# Patient Record
Sex: Female | Born: 1935 | Race: White | Hispanic: No | Smoking: Former smoker
Health system: Southern US, Community
[De-identification: ages and names within clinical notes are randomized; demographics above are authoritative.]

## PROBLEM LIST (undated history)

## (undated) DIAGNOSIS — E559 Vitamin D deficiency, unspecified: Secondary | ICD-10-CM

## (undated) DIAGNOSIS — Z87891 Personal history of nicotine dependence: Secondary | ICD-10-CM

## (undated) DIAGNOSIS — E213 Hyperparathyroidism, unspecified: Secondary | ICD-10-CM

## (undated) DIAGNOSIS — F0390 Unspecified dementia without behavioral disturbance: Secondary | ICD-10-CM

## (undated) DIAGNOSIS — D649 Anemia, unspecified: Secondary | ICD-10-CM

## (undated) DIAGNOSIS — I10 Essential (primary) hypertension: Secondary | ICD-10-CM

## (undated) DIAGNOSIS — D72829 Elevated white blood cell count, unspecified: Secondary | ICD-10-CM

## (undated) DIAGNOSIS — D510 Vitamin B12 deficiency anemia due to intrinsic factor deficiency: Secondary | ICD-10-CM

## (undated) DIAGNOSIS — S72141A Displaced intertrochanteric fracture of right femur, initial encounter for closed fracture: Secondary | ICD-10-CM

## (undated) DIAGNOSIS — M810 Age-related osteoporosis without current pathological fracture: Secondary | ICD-10-CM

## (undated) DIAGNOSIS — F419 Anxiety disorder, unspecified: Secondary | ICD-10-CM

## (undated) DIAGNOSIS — I2510 Atherosclerotic heart disease of native coronary artery without angina pectoris: Secondary | ICD-10-CM

## (undated) DIAGNOSIS — E785 Hyperlipidemia, unspecified: Secondary | ICD-10-CM

## (undated) HISTORY — DX: Displaced intertrochanteric fracture of right femur, initial encounter for closed fracture: S72.141A

## (undated) HISTORY — PX: TONSILLECTOMY: SUR1361

## (undated) HISTORY — DX: Unspecified dementia without behavioral disturbance: F03.90

---

## 1998-05-24 ENCOUNTER — Encounter (HOSPITAL_COMMUNITY): Admission: RE | Admit: 1998-05-24 | Discharge: 1998-08-22 | Payer: Self-pay | Admitting: Family Medicine

## 1998-09-25 ENCOUNTER — Encounter (HOSPITAL_COMMUNITY): Admission: RE | Admit: 1998-09-25 | Discharge: 1998-12-24 | Payer: Self-pay | Admitting: Family Medicine

## 1999-01-16 ENCOUNTER — Encounter (HOSPITAL_COMMUNITY): Admission: RE | Admit: 1999-01-16 | Discharge: 1999-04-16 | Payer: Self-pay | Admitting: Family Medicine

## 2000-02-25 ENCOUNTER — Other Ambulatory Visit: Admission: RE | Admit: 2000-02-25 | Discharge: 2000-02-25 | Payer: Self-pay | Admitting: Family Medicine

## 2005-12-25 ENCOUNTER — Inpatient Hospital Stay (HOSPITAL_COMMUNITY): Admission: EM | Admit: 2005-12-25 | Discharge: 2005-12-26 | Payer: Self-pay | Admitting: Emergency Medicine

## 2007-12-29 ENCOUNTER — Emergency Department (HOSPITAL_COMMUNITY): Admission: EM | Admit: 2007-12-29 | Discharge: 2007-12-30 | Payer: Self-pay | Admitting: Emergency Medicine

## 2009-12-11 ENCOUNTER — Observation Stay (HOSPITAL_COMMUNITY): Admission: EM | Admit: 2009-12-11 | Discharge: 2009-12-14 | Payer: Self-pay | Admitting: Emergency Medicine

## 2010-10-18 ENCOUNTER — Encounter: Admission: RE | Admit: 2010-10-18 | Discharge: 2010-10-18 | Payer: Self-pay | Admitting: Family Medicine

## 2010-11-11 ENCOUNTER — Encounter
Admission: RE | Admit: 2010-11-11 | Discharge: 2010-11-11 | Payer: Self-pay | Source: Home / Self Care | Attending: Family Medicine | Admitting: Family Medicine

## 2010-12-14 ENCOUNTER — Encounter: Payer: Self-pay | Admitting: Family Medicine

## 2010-12-15 ENCOUNTER — Encounter: Payer: Self-pay | Admitting: Family Medicine

## 2011-02-09 LAB — BASIC METABOLIC PANEL
BUN: 6 mg/dL (ref 6–23)
BUN: 8 mg/dL (ref 6–23)
CO2: 25 mEq/L (ref 19–32)
Chloride: 105 mEq/L (ref 96–112)
Chloride: 107 mEq/L (ref 96–112)
Creatinine, Ser: 0.59 mg/dL (ref 0.4–1.2)
Glucose, Bld: 96 mg/dL (ref 70–99)
Potassium: 3.8 mEq/L (ref 3.5–5.1)
Sodium: 138 mEq/L (ref 135–145)

## 2011-02-09 LAB — DIFFERENTIAL
Basophils Relative: 1 % (ref 0–1)
Eosinophils Absolute: 0.1 10*3/uL (ref 0.0–0.7)
Monocytes Absolute: 0.4 10*3/uL (ref 0.1–1.0)
Monocytes Relative: 8 % (ref 3–12)

## 2011-02-09 LAB — PROTIME-INR
INR: 0.99 (ref 0.00–1.49)
Prothrombin Time: 13 seconds (ref 11.6–15.2)

## 2011-02-09 LAB — CBC
HCT: 34.7 % — ABNORMAL LOW (ref 36.0–46.0)
Hemoglobin: 11.6 g/dL — ABNORMAL LOW (ref 12.0–15.0)
Hemoglobin: 11.8 g/dL — ABNORMAL LOW (ref 12.0–15.0)
MCHC: 33.6 g/dL (ref 30.0–36.0)
MCV: 81.4 fL (ref 78.0–100.0)
MCV: 81.7 fL (ref 78.0–100.0)
RBC: 4.22 MIL/uL (ref 3.87–5.11)
RBC: 4.26 MIL/uL (ref 3.87–5.11)
WBC: 5.2 10*3/uL (ref 4.0–10.5)

## 2011-02-09 LAB — CARDIAC PANEL(CRET KIN+CKTOT+MB+TROPI): Troponin I: 0.01 ng/mL (ref 0.00–0.06)

## 2011-02-09 LAB — LIPID PANEL
HDL: 50 mg/dL (ref >39)
Total CHOL/HDL Ratio: 3.3 RATIO
Triglycerides: 61 mg/dL (ref <150)

## 2011-02-09 LAB — POCT CARDIAC MARKERS: Troponin i, poc: <0.05 ng/mL (ref 0.00–0.09)

## 2011-02-09 LAB — CK TOTAL AND CKMB (NOT AT ARMC)
CK, MB: 1.7 ng/mL (ref 0.3–4.0)
Relative Index: INVALID (ref 0.0–2.5)
Total CK: 74 U/L (ref 7–177)

## 2011-02-09 LAB — GLUCOSE, CAPILLARY
Glucose-Capillary: 104 mg/dL — ABNORMAL HIGH (ref 70–99)
Glucose-Capillary: 109 mg/dL — ABNORMAL HIGH (ref 70–99)

## 2011-02-09 LAB — POCT I-STAT, CHEM 8
Calcium, Ion: 1.34 mmol/L — ABNORMAL HIGH (ref 1.12–1.32)
Chloride: 107 mEq/L (ref 96–112)
Glucose, Bld: 107 mg/dL — ABNORMAL HIGH (ref 70–99)
HCT: 35 % — ABNORMAL LOW (ref 36.0–46.0)
Hemoglobin: 11.9 g/dL — ABNORMAL LOW (ref 12.0–15.0)

## 2011-02-09 LAB — TSH: TSH: 0.673 u[IU]/mL (ref 0.350–4.500)

## 2011-04-11 NOTE — Cardiovascular Report (Signed)
Pamela Blackburn, EVON                ACCOUNT NO.:  [id number]   MEDICAL RECORD NO.:  [id number]          PATIENT TYPE:  INP   LOCATION:  3713                         FACILITY:  [facility]   PHYSICIAN:  Mohan N. Sharyn Lull, M.D. DATE OF BIRTH:  1936/04/20   DATE OF PROCEDURE:  12/26/2005  DATE OF DISCHARGE:                              CARDIAC CATHETERIZATION   PROCEDURE:  Left cardiac catheterization with selective left and right  coronary angiograph the left ventriculography via right groin using Judkins  technique.   INDICATIONS FOR PROCEDURE:  Pamela Blackburn is a 75 year old white female with  past medical history significant for pernicious anemia, remote history of  tobacco abuse. She came to the ER via EMS complaining of retrosternal chest  pain described as soreness, tightness associated with feeling weak and tired  last night while in grocery store and again had similar pain while going up  stairs lasting 10-15 minutes. Denies any nausea, vomiting, diaphoresis.  Denies palpitation, lightheadedness or syncope. Denies PND, orthopnea, leg  swelling. States she has been having occasional chest pain off and on  lasting few seconds to minutes with exertion. The patient was noted to have  minimally elevated troponin I and EKG showed normal sinus rhythm and  nonspecific ST-T wave changes. The patient was started on IV heparin and  nitrates with relief of chest pain. Due to typical anginal chest pain,  minimally elevated troponin I, discussed with the patient regarding left  cath, possible angioplasty and stenting its risks and benefits, i.e. death,  MI, stroke, need for emergency CABG, risk of restenosis, local vascular  complications, etc., and consented for the procedure.   PROCEDURE:  After obtaining informed consent, the patient was brought to the  cath lab and was placed on fluoroscopy table. The right groin was prepped  and draped in usual fashion. 2% Xylocaine was used for local anesthesia  in  the right groin. A __________ thin-walled needle 6-French arterial sheath  was placed. Sheath was aspirated and flushed. Next, 6-French left Judkins  catheter was advanced over the wire under fluoroscopic guidance up to the  ascending aorta. Wire was pulled out. The catheter was aspirated and  connected to the manifold. Catheter was further advanced and engaged into  the left coronary ostium. Multiple views of the left system were taken.  Next, the catheter was disengaged and was pulled out over the wire and was  replaced with a 6-French right Judkins catheter which was advanced over the  wire under fluoroscopic guidance up to the ascending aorta. The wire was  pulled out. The catheter was aspirated and connected to the manifold.  Catheter was further advanced and engaged into right coronary ostium.  Multiple views of the right system were taken. Next, the catheter was  disengaged and was pulled out over the wire and was replaced with 6-French  pigtail catheter which was advanced over the wire under fluoroscopic  guidance up to the ascending aorta. Wire was pulled out. The catheter was  aspirated and connected to the manifold. Catheter was further advanced  across aortic valve into the LV. LV  pressures were recorded. Next LV gram  was done in 30 degrees RAO position. Post angiographic pressures were  recorded from LV and then pullback pressures were recorded from the aorta.  There was no gradient across aortic valve. Next the pigtail catheter was  pulled out over the wire. Sheaths were aspirated and flushed.   FINDINGS:  LV showed mild anterolateral and mid inferior wall hypokinesia,  EF of 45%. Left main was short which was patent. LAD has 10-15% mid stenosis  and distally which tapers down at the apex. Diagonal-1 was  moderate size first patent. Diagonal-2 was very small. Left circumflex was  small which was patent. High OM-1 was very small which was patent. RCA has  15-20% mid  stenosis. The patient tolerated procedure well. There were no  complications. The patient was transferred to recovery room in stable  condition.           ______________________________  Eduardo Osier Sharyn Lull, M.D.     MNH/MEDQ  D:  12/26/2005  T:  12/27/2005  Job:  [id number]

## 2011-04-11 NOTE — Discharge Summary (Signed)
[name], Blackburn                ACCOUNT NO.:  [id number]   MEDICAL RECORD NO.:  [id number]          PATIENT TYPE:  INP   LOCATION:  3713                         FACILITY:  [facility]   PHYSICIAN:  Mohan N. Sharyn Lull, M.D. DATE OF BIRTH:  Jan 17, 1936   DATE OF ADMISSION:  12/25/2005  DATE OF DISCHARGE:  12/26/2005                                 DISCHARGE SUMMARY   ADMITTING DIAGNOSES:  1.  Acute coronary syndrome.  2.  History of pernicious anemia.  3.  Remote history of tobacco abuse.  4.  Elevated blood sugar, rule out diabetes mellitus.   FINAL DIAGNOSES:  1.  Status post probable small non-T wave myocardial infarction secondary to      vasospasm.  2.  Hypercholesterolemia.  3.  Remote history of tobacco abuse.  4.  Pernicious anemia.  5.  Elevated blood sugar, rule out diabetes mellitus.   DISCHARGE HOME MEDICATIONS:  1.  Enteric-coated aspirin 81 mg one tablet daily.  2.  Plavix 75 mg one tablet daily with food.  3.  Nitro-Dur 0.2 mg per hour apply to chest wall in the a.m., off at night.  4.  Lopressor 25 mg 1/2 tablet twice daily.  5.  Altace 1.25 mg one capsule daily.  6.  Nitrostat 0.4 mg sublingual use as directed.   DIET:  Low-salt and low-cholesterol.  The patient has been advised to avoid  sweets.   ACTIVITY:  Increase activity slowly.  No driving, lifting, pushing or  pulling for 48 hours.  Post cardiac cath instructions have been given.  Follow up with me in one week.   CONDITION AT DISCHARGE:  Stable.   BRIEF HISTORY AND HOSPITAL COURSE:  [name] is a 75 year old white female  with a past medical history significant for pernicious anemia and remote  history of tobacco abuse.  She came to the ER via EMS complaining of  retrosternal chest pain described as soreness and tightness associated with  feeling weak and tired last night while in grocery store and again had  similar pain while going up stairs lasting 5 to 10 minutes.  Denies any  nausea, vomiting or  diaphoresis.  Denies palpitations and light-headedness  or syncope.  Denies PND, orthopnea or leg swelling.  States she has been  having occasional chest pain off and on lasting a few seconds to minutes  with exertion.  The patient was noted in the ER to have a minimally elevated  troponin-I and EKG showed normal sinus rhythm with nonspecific T-wave  changes.  The patient was started on IV heparin and nitrates with relief of  chest pain.   PAST MEDICAL HISTORY:  As above.   PAST SURGICAL HISTORY:  She had a tonsillectomy in the past.   ALLERGIES:  SHE IS ALLERGIC TO DEMEROL.   MEDICATIONS AT HOME:  She is on vitamin B12 injections.   SOCIAL HISTORY:  She is separated, has five children, smoked a half pack to  one pack per day for 10+ years, quit 29 years ago.  No history of alcohol  abuse.  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   FAMILY HISTORY:  Father died of lung cancer and mother died of breast cancer  in the past.   EXAMINATION:  GENERAL:  She is alert, awake, oriented x3 in no acute  distress.  VITAL SIGNS:  Blood pressure is 125/78 and pulse was in 82 regular.  HEENT:  Conjunctivae was pink.  NECK:  Supple, no JVD and no bruit.  LUNGS:  Clear to auscultation without rhonchi or rales.  CARDIOVASCULAR EXAMINATION:  S1 and S2 is normal.  There was a soft S4  gallop.  ABDOMEN:  Soft, bowel sounds are present and nontender.  EXTREMITIES:  There is no clubbing, cyanosis or edema.   LABORATORIES:  EKG showed normal sinus rhythm with nonspecific T-wave  changes.  A chest x-ray showed no acute chest finding.  There is scoliosis  with mild aortic tortuosity.  Her point of care cardiac enzymes:  CPK-MB was  4.1 and troponin-I was 0.22, second set was 0.13 and CPK-MB was 2.7.  Her  cholesterol was 170, LDL 107, which was slightly elevated, HDL was 50 and  triglycerides 66.  Hemoglobin A1C was 5.6.  Her total CK was 108, MB 3.7,  total index 3.4.  Second set:   CK 107, MB 7.6, troponin-I was 0.39, 1.04 and  today is 0.75.  Her sodium was 134, potassium 3.7, chloride 101, bicarb 26,  blood sugar was 140, the brief fasting blood sugar was 100, BUN 8,  creatinine 0.7, liver enzymes were normal, hemoglobin was 12.9, hematocrit  37.6, white count of 8.8.   BRIEF HOSPITAL COURSE:  The patient was admitted to step-down unit.  The  patient ruled in for a small non-T wave MI due to elevated troponin-I and  subsequently the patient underwent a left cardiac cath to select the left  and right coronary angiography as per procedure report.  There were no  critical stenosis, but LV showed anterolateral and mid-anterior wall  hypokinesis with an EF of approximately 45%.  The patient was started on a  low-dose ACE, and beta blockers and nitrates in view of possible spasm.  The  patient did not have any episodes of further chest pain during the hospital  stay.  The patient has been ambulating in the hallway without any problems.  Her groin is stable and there is no evidence of hematoma or bruit.  The  patient will be discharged home on above medications and will be followed up  in my office in one week.           ______________________________  Eduardo Osier. Sharyn Lull, M.D.     MNH/MEDQ  D:  12/26/2005  T:  12/27/2005  Job:  [id number]

## 2011-04-23 ENCOUNTER — Other Ambulatory Visit (INDEPENDENT_AMBULATORY_CARE_PROVIDER_SITE_OTHER): Payer: Self-pay | Admitting: Surgery

## 2011-04-23 DIAGNOSIS — E0590 Thyrotoxicosis, unspecified without thyrotoxic crisis or storm: Secondary | ICD-10-CM

## 2011-04-25 ENCOUNTER — Other Ambulatory Visit: Payer: Self-pay

## 2011-05-07 ENCOUNTER — Encounter (HOSPITAL_COMMUNITY): Payer: Self-pay

## 2011-08-15 LAB — BASIC METABOLIC PANEL
BUN: 11
CO2: 27
Calcium: 10.9 — ABNORMAL HIGH
Creatinine, Ser: 0.64
GFR calc non Af Amer: >60
Glucose, Bld: 125 — ABNORMAL HIGH

## 2011-08-15 LAB — POCT CARDIAC MARKERS
CKMB, poc: <1 — ABNORMAL LOW
Operator id: 4001
Troponin i, poc: <0.05

## 2011-08-15 LAB — CBC
MCHC: 34.1
Platelets: 275
RDW: 14.4

## 2011-08-15 LAB — DIFFERENTIAL
Basophils Absolute: 0
Basophils Relative: 1
Eosinophils Relative: 1
Lymphocytes Relative: 18
Monocytes Absolute: 0.5
Neutro Abs: 6.1

## 2012-07-26 ENCOUNTER — Encounter (HOSPITAL_COMMUNITY): Payer: Self-pay | Admitting: *Deleted

## 2012-07-26 ENCOUNTER — Inpatient Hospital Stay (HOSPITAL_COMMUNITY)
Admission: EM | Admit: 2012-07-26 | Discharge: 2012-08-02 | DRG: 481 | Disposition: A | Discharge status: Skilled Nursing Facility | Payer: Medicare Other | Attending: Internal Medicine | Admitting: Internal Medicine

## 2012-07-26 ENCOUNTER — Emergency Department (HOSPITAL_COMMUNITY): Payer: Medicare Other

## 2012-07-26 DIAGNOSIS — D62 Acute posthemorrhagic anemia: Secondary | ICD-10-CM | POA: Diagnosis not present

## 2012-07-26 DIAGNOSIS — R509 Fever, unspecified: Secondary | ICD-10-CM

## 2012-07-26 DIAGNOSIS — D510 Vitamin B12 deficiency anemia due to intrinsic factor deficiency: Secondary | ICD-10-CM | POA: Diagnosis present

## 2012-07-26 DIAGNOSIS — F419 Anxiety disorder, unspecified: Secondary | ICD-10-CM | POA: Diagnosis present

## 2012-07-26 DIAGNOSIS — I10 Essential (primary) hypertension: Secondary | ICD-10-CM | POA: Diagnosis present

## 2012-07-26 DIAGNOSIS — I2510 Atherosclerotic heart disease of native coronary artery without angina pectoris: Secondary | ICD-10-CM | POA: Diagnosis present

## 2012-07-26 DIAGNOSIS — W010XXA Fall on same level from slipping, tripping and stumbling without subsequent striking against object, initial encounter: Secondary | ICD-10-CM | POA: Diagnosis present

## 2012-07-26 DIAGNOSIS — E860 Dehydration: Secondary | ICD-10-CM

## 2012-07-26 DIAGNOSIS — Y9210 Unspecified residential institution as the place of occurrence of the external cause: Secondary | ICD-10-CM | POA: Diagnosis present

## 2012-07-26 DIAGNOSIS — N390 Urinary tract infection, site not specified: Secondary | ICD-10-CM | POA: Diagnosis not present

## 2012-07-26 DIAGNOSIS — S72143A Displaced intertrochanteric fracture of unspecified femur, initial encounter for closed fracture: Secondary | ICD-10-CM

## 2012-07-26 DIAGNOSIS — R1310 Dysphagia, unspecified: Secondary | ICD-10-CM | POA: Diagnosis present

## 2012-07-26 DIAGNOSIS — E785 Hyperlipidemia, unspecified: Secondary | ICD-10-CM | POA: Diagnosis present

## 2012-07-26 DIAGNOSIS — D72829 Elevated white blood cell count, unspecified: Secondary | ICD-10-CM | POA: Diagnosis present

## 2012-07-26 DIAGNOSIS — F411 Generalized anxiety disorder: Secondary | ICD-10-CM | POA: Diagnosis present

## 2012-07-26 DIAGNOSIS — S72141A Displaced intertrochanteric fracture of right femur, initial encounter for closed fracture: Secondary | ICD-10-CM | POA: Diagnosis present

## 2012-07-26 DIAGNOSIS — E213 Hyperparathyroidism, unspecified: Secondary | ICD-10-CM | POA: Diagnosis present

## 2012-07-26 DIAGNOSIS — E559 Vitamin D deficiency, unspecified: Secondary | ICD-10-CM | POA: Diagnosis present

## 2012-07-26 DIAGNOSIS — Z87891 Personal history of nicotine dependence: Secondary | ICD-10-CM

## 2012-07-26 DIAGNOSIS — F0390 Unspecified dementia without behavioral disturbance: Secondary | ICD-10-CM | POA: Diagnosis present

## 2012-07-26 DIAGNOSIS — M810 Age-related osteoporosis without current pathological fracture: Secondary | ICD-10-CM | POA: Diagnosis present

## 2012-07-26 HISTORY — DX: Unspecified dementia, unspecified severity, without behavioral disturbance, psychotic disturbance, mood disturbance, and anxiety: F03.90

## 2012-07-26 HISTORY — DX: Personal history of nicotine dependence: Z87.891

## 2012-07-26 HISTORY — DX: Vitamin B12 deficiency anemia due to intrinsic factor deficiency: D51.0

## 2012-07-26 HISTORY — DX: Vitamin D deficiency, unspecified: E55.9

## 2012-07-26 HISTORY — DX: Hyperlipidemia, unspecified: E78.5

## 2012-07-26 HISTORY — DX: Essential (primary) hypertension: I10

## 2012-07-26 HISTORY — DX: Elevated white blood cell count, unspecified: D72.829

## 2012-07-26 HISTORY — DX: Anxiety disorder, unspecified: F41.9

## 2012-07-26 HISTORY — DX: Hyperparathyroidism, unspecified: E21.3

## 2012-07-26 HISTORY — DX: Atherosclerotic heart disease of native coronary artery without angina pectoris: I25.10

## 2012-07-26 HISTORY — DX: Displaced intertrochanteric fracture of right femur, initial encounter for closed fracture: S72.141A

## 2012-07-26 HISTORY — DX: Anemia, unspecified: D64.9

## 2012-07-26 HISTORY — DX: Age-related osteoporosis without current pathological fracture: M81.0

## 2012-07-26 LAB — URINALYSIS, ROUTINE W REFLEX MICROSCOPIC
Leukocytes, UA: NEGATIVE
Nitrite: NEGATIVE
Specific Gravity, Urine: 1.015 (ref 1.005–1.030)
pH: 7.5 (ref 5.0–8.0)

## 2012-07-26 LAB — CBC WITH DIFFERENTIAL/PLATELET
Basophils Absolute: 0 10*3/uL (ref 0.0–0.1)
Basophils Relative: 0 % (ref 0–1)
Eosinophils Absolute: 0 10*3/uL (ref 0.0–0.7)
Eosinophils Relative: 0 % (ref 0–5)
HCT: 37.1 % (ref 36.0–46.0)
Lymphocytes Relative: 6 % — ABNORMAL LOW (ref 12–46)
MCHC: 33.7 g/dL (ref 30.0–36.0)
MCV: 87.1 fL (ref 78.0–100.0)
Monocytes Absolute: 0.9 10*3/uL (ref 0.1–1.0)
Platelets: 316 10*3/uL (ref 150–400)
RDW: 14 % (ref 11.5–15.5)
WBC: 14.6 10*3/uL — ABNORMAL HIGH (ref 4.0–10.5)

## 2012-07-26 LAB — PROTIME-INR: Prothrombin Time: 13.2 seconds (ref 11.6–15.2)

## 2012-07-26 LAB — BASIC METABOLIC PANEL
BUN: 9 mg/dL (ref 6–23)
CO2: 25 mEq/L (ref 19–32)
Calcium: 10.9 mg/dL — ABNORMAL HIGH (ref 8.4–10.5)
Chloride: 100 mEq/L (ref 96–112)
Creatinine, Ser: 0.54 mg/dL (ref 0.50–1.10)

## 2012-07-26 LAB — VITAMIN D 25 HYDROXY (VIT D DEFICIENCY, FRACTURES): Vit D, 25-Hydroxy: 17 ng/mL — ABNORMAL LOW (ref 30–89)

## 2012-07-26 LAB — HEPATIC FUNCTION PANEL
AST: 18 U/L (ref 0–37)
Albumin: 3.7 g/dL (ref 3.5–5.2)

## 2012-07-26 MED ORDER — LORAZEPAM 0.5 MG PO TABS
0.5000 mg | ORAL_TABLET | Freq: Two times a day (BID) | ORAL | Status: DC
  Administered 2012-07-26 – 2012-08-01 (×9): 0.5 mg via ORAL
  Filled 2012-07-26 (×10): qty 1

## 2012-07-26 MED ORDER — FENTANYL CITRATE 0.05 MG/ML IJ SOLN: 50.0000 ug | INTRAMUSCULAR | Status: DC | PRN

## 2012-07-26 MED ORDER — LORAZEPAM 2 MG/ML IJ SOLN
0.5000 mg | Freq: Three times a day (TID) | INTRAMUSCULAR | Status: DC | PRN
  Administered 2012-07-27 – 2012-07-30 (×5): 0.5 mg via INTRAVENOUS
  Filled 2012-07-26 (×5): qty 1

## 2012-07-26 MED ORDER — OXYCODONE HCL 5 MG PO TABS
5.0000 mg | ORAL_TABLET | ORAL | Status: DC | PRN
  Administered 2012-07-26 – 2012-08-02 (×2): 5 mg via ORAL
  Filled 2012-07-26 (×2): qty 1

## 2012-07-26 MED ORDER — DOCUSATE SODIUM 100 MG PO CAPS
100.0000 mg | ORAL_CAPSULE | Freq: Two times a day (BID) | ORAL | Status: DC
  Administered 2012-07-26 – 2012-07-30 (×6): 100 mg via ORAL
  Filled 2012-07-26 (×4): qty 1

## 2012-07-26 MED ORDER — SODIUM CHLORIDE 0.9 % IV SOLN: INTRAVENOUS | Status: DC

## 2012-07-26 MED ORDER — SODIUM CHLORIDE 0.9 % IV SOLN
1000.0000 mL | INTRAVENOUS | Status: DC
  Administered 2012-07-26: 1000 mL via INTRAVENOUS

## 2012-07-26 MED ORDER — SODIUM CHLORIDE 0.9 % IV SOLN: 1000.0000 mL | INTRAVENOUS | Status: DC

## 2012-07-26 MED ORDER — SODIUM CHLORIDE 0.9 % IV SOLN
INTRAVENOUS | Status: AC
  Administered 2012-07-26 – 2012-07-28 (×3): via INTRAVENOUS

## 2012-07-26 MED ORDER — MORPHINE SULFATE 2 MG/ML IJ SOLN
0.5000 mg | INTRAMUSCULAR | Status: DC | PRN
  Administered 2012-07-27 – 2012-07-31 (×4): 0.5 mg via INTRAVENOUS
  Filled 2012-07-26 (×3): qty 1

## 2012-07-26 MED ORDER — FENTANYL CITRATE 0.05 MG/ML IJ SOLN
50.0000 ug | INTRAMUSCULAR | Status: AC | PRN
  Administered 2012-07-26 (×2): 50 ug via INTRAVENOUS
  Filled 2012-07-26 (×2): qty 2

## 2012-07-26 MED ORDER — ACETAMINOPHEN 500 MG PO TABS
1000.0000 mg | ORAL_TABLET | Freq: Four times a day (QID) | ORAL | Status: DC | PRN
  Administered 2012-07-27 – 2012-08-02 (×4): 1000 mg via ORAL
  Filled 2012-07-26: qty 2
  Filled 2012-07-26: qty 1
  Filled 2012-07-26 (×3): qty 2

## 2012-07-26 MED ORDER — MORPHINE SULFATE 2 MG/ML IJ SOLN
0.5000 mg | INTRAMUSCULAR | Status: DC | PRN
  Administered 2012-07-27 – 2012-07-28 (×3): 0.5 mg via INTRAVENOUS
  Administered 2012-07-28 – 2012-07-30 (×4): 1 mg via INTRAVENOUS
  Filled 2012-07-26 (×10): qty 1

## 2012-07-26 MED ORDER — ACETAMINOPHEN ER 650 MG PO TBCR: 1300.0000 mg | EXTENDED_RELEASE_TABLET | Freq: Two times a day (BID) | ORAL | Status: DC

## 2012-07-26 MED ORDER — DEXTROMETHORPHAN-GUAIFENESIN 10-100 MG/5ML PO LIQD
5.0000 mL | Freq: Three times a day (TID) | ORAL | Status: DC | PRN
  Filled 2012-07-26: qty 5

## 2012-07-26 MED ORDER — GUAIFENESIN-DM 100-10 MG/5ML PO SYRP: 5.0000 mL | ORAL_SOLUTION | Freq: Three times a day (TID) | ORAL | Status: DC | PRN

## 2012-07-26 MED ORDER — VITAMIN B-12 1000 MCG PO TABS
1000.0000 ug | ORAL_TABLET | Freq: Every day | ORAL | Status: DC
  Administered 2012-07-27 – 2012-08-02 (×6): 1000 ug via ORAL
  Filled 2012-07-26 (×7): qty 1

## 2012-07-26 MED ORDER — FERROUS SULFATE 325 (65 FE) MG PO TABS
325.0000 mg | ORAL_TABLET | Freq: Every day | ORAL | Status: DC
  Administered 2012-07-27 – 2012-08-02 (×6): 325 mg via ORAL
  Filled 2012-07-26 (×9): qty 1

## 2012-07-26 MED ORDER — METOPROLOL TARTRATE 50 MG PO TABS
50.0000 mg | ORAL_TABLET | Freq: Every day | ORAL | Status: DC
  Administered 2012-07-27 – 2012-07-30 (×4): 50 mg via ORAL
  Filled 2012-07-26 (×4): qty 1

## 2012-07-26 MED ORDER — BIOTENE DRY MOUTH MT LIQD
15.0000 mL | Freq: Two times a day (BID) | OROMUCOSAL | Status: DC
  Administered 2012-07-26 – 2012-08-02 (×12): 15 mL via OROMUCOSAL

## 2012-07-26 MED ORDER — ONDANSETRON HCL 4 MG/2ML IJ SOLN
4.0000 mg | Freq: Once | INTRAMUSCULAR | Status: AC
  Administered 2012-07-26: 4 mg via INTRAVENOUS
  Filled 2012-07-26: qty 2

## 2012-07-26 NOTE — ED Notes (Signed)
AVW:[id number]<XB> Expected date:<BR> Expected time:<BR> Means of arrival:<BR> Comments:<BR> 75yoF- fall, R hip pain, no obvious deformity; dementia

## 2012-07-26 NOTE — ED Notes (Signed)
Pt speaking in word salad. Pt disoriented to name, time, and place.

## 2012-07-26 NOTE — ED Notes (Addendum)
Pt in by ems, from [facility]. Pt A&O x1 per norm. Pt wanders a lot at night. Pt had unwitnessed fall last night, was assisted back to bed, ambulatory after. However, has not been walking since and c/o R hip pain. Chronic hip pain but staff sts she is still normally ambulatory. No obvious deformity, however pt is limiting exam due to guarding.

## 2012-07-26 NOTE — ED Provider Notes (Signed)
History     CSN: [id number]  Arrival date & time 07/26/12  1013   First MD Initiated Contact with Patient 07/26/12 1017      Chief Complaint  Patient presents with  . Hip Pain  level 5 caveat due to dementia.  (Consider location/radiation/quality/duration/timing/severity/associated sxs/prior treatment) Patient is a 76 y.o. female presenting with hip pain. The history is provided by the patient and the nursing home.  Hip Pain This is a chronic problem.   demented patient from the nursing home. Reportedly had a fall last night it was unwitnessed. Right hip pain. Was reportedly walking since the fall, but is not ambulatory now. Tender to right hip. She is clearly demented.  Past Medical History  Diagnosis Date  . Anemia   . Osteoporosis   . Coronary artery disease   . Dementia   . Hypertension   . Leukocytosis 07/26/2012  . Hyperlipidemia 07/26/2012  . Pernicious anemia 07/26/2012  . HTN (hypertension) 07/26/2012  . CAD (coronary artery disease) 07/26/2012    Cath 12/26/05: LAD 10-15% mid stenosis and distally, LCIRC small and patent, RCA 15-20% mid stenosis. EF 45%, LV with mild anterolateral and mid inferior wall hypokinesia  Myoview : 12/13/2009: No evidence of inducible reversible ischemia EF 77%  . Anxiety 07/26/2012  . History of tobacco use 07/26/2012  . Hyperparathyroidism 07/26/2012  . Vitamin d deficiency 07/26/2012    Past Surgical History  Procedure Date  . Tonsillectomy     Family History  Problem Relation Age of Onset  . Breast cancer Mother   . Lung cancer Father     History  Substance Use Topics  . Smoking status: Former Smoker -- 0.5 packs/day    Types: Cigarettes  . Smokeless tobacco: Not on file  . Alcohol Use: No    OB History    Grav Para Term Preterm Abortions TAB SAB Ect Mult Living                  Review of Systems  Unable to perform ROS: Dementia    Allergies  Review of patient's allergies indicates no known allergies.  Home Medications   No  current outpatient prescriptions on file.  BP 120/73  Pulse 69  Temp 97.9 F (36.6 C) (Oral)  Resp 15  SpO2 97%  Physical Exam  Constitutional: She appears well-developed and well-nourished.  HENT:  Head: Normocephalic and atraumatic.  Eyes: Pupils are equal, round, and reactive to light.  Neck: Neck supple.  Cardiovascular: Normal rate.   Pulmonary/Chest: Effort normal and breath sounds normal.  Abdominal: There is no tenderness.  Musculoskeletal:       Tenderness to right upper thigh. Pain with movement. Pulse intact in the foot.  Neurological: She is alert.       Patient appears demented and is speaking in confused phrases.  Skin: Skin is warm.    ED Course  Procedures (including critical care time)  Labs Reviewed  BASIC METABOLIC PANEL - Abnormal; Notable for the following:    Glucose, Bld 107 (*)     Calcium 10.9 (*)     All other components within normal limits  CBC WITH DIFFERENTIAL - Abnormal; Notable for the following:    WBC 14.6 (*)     Neutrophils Relative 88 (*)     Neutro Abs 12.8 (*)     Lymphocytes Relative 6 (*)     All other components within normal limits  URINALYSIS, ROUTINE W REFLEX MICROSCOPIC - Abnormal; Notable for the  following:    APPearance CLOUDY (*)     All other components within normal limits  PROTIME-INR  TYPE AND SCREEN  ABO/RH  URINE CULTURE  HEPATIC FUNCTION PANEL  PTH, INTACT AND CALCIUM  VITAMIN D 1,25 DIHYDROXY  VITAMIN D 25 HYDROXY  MAGNESIUM  PHOSPHORUS   Dg Chest 1 View  07/26/2012  *RADIOLOGY REPORT*  Clinical Data: Preoperative respiratory exam for right hip fracture.  CHEST - 1 VIEW  Comparison: 12/11/2009  Findings: No evidence of pulmonary infiltrate, edema or pleural effusion.  Heart size is normal.  No bony abnormalities identified.  IMPRESSION: No active disease in the chest.   Original Report Authenticated By: Reola Calkins, M.D.    Dg Hip Complete Right  07/26/2012  *RADIOLOGY REPORT*  Clinical Data: Fall  with right hip pain.  RIGHT HIP - COMPLETE 2+ VIEW  Comparison: None.  Findings: Acute and comminuted intertrochanteric fracture of the proximal right femur shows angulation and displacement.  There is medial displacement of the lesser trochanter.  No evidence of dislocation.  The rest of the bony pelvis appears intact.  IMPRESSION: Acute intertrochanteric fracture of the right hip.   Original Report Authenticated By: Reola Calkins, M.D.      1. Hip fracture, intertrochanteric     Date: 07/26/2012  Rate:68  Rhythm: normal sinus rhythm  QRS Axis: normal  Intervals: normal  ST/T Wave abnormalities: normal  Conduction Disutrbances:none  Narrative Interpretation:   Old EKG Reviewed: unchanged     MDM  Demented patient with fall last night. Right intertrochanteric hip fracture. Patient will be admitted to medicine with and also consult Dr. Ophelia Charter.        Juliet Rude. Rubin Payor, MD 07/26/12 [phone]

## 2012-07-26 NOTE — ED Notes (Signed)
Pt pulled IV out. IV restarted.

## 2012-07-26 NOTE — ED Notes (Signed)
Before catheter placement verified by chart pt is not allergic to iodine, betadine, shellfish, or latex.

## 2012-07-26 NOTE — Consult Note (Signed)
Reason for Consult:  RIGHT HIP FRACTURE INTERTROCH  FRACTURE WITH COMMINUTION  Referring Physician: Dr. Ramiro Harvest  Pamela Blackburn is an 76 y.o. female.  HPI:  Pt with dementia  Lives in skilled facility , Husband has POA, fell with IT hip fx. She is an Scientist, product/process development.   Past Medical History  Diagnosis Date  . Anemia   . Osteoporosis   . Coronary artery disease   . Dementia   . Hypertension   . Leukocytosis 07/26/2012  . Hyperlipidemia 07/26/2012  . Pernicious anemia 07/26/2012  . HTN (hypertension) 07/26/2012  . CAD (coronary artery disease) 07/26/2012    Cath 12/26/05: LAD 10-15% mid stenosis and distally, LCIRC small and patent, RCA 15-20% mid stenosis. EF 45%, LV with mild anterolateral and mid inferior wall hypokinesia  Myoview : 12/13/2009: No evidence of inducible reversible ischemia EF 77%  . Anxiety 07/26/2012  . History of tobacco use 07/26/2012  . Hyperparathyroidism 07/26/2012  . Vitamin d deficiency 07/26/2012    Past Surgical History  Procedure Date  . Tonsillectomy     Family History  Problem Relation Age of Onset  . Breast cancer Mother   . Lung cancer Father     Social History:  reports that she has quit smoking. Her smoking use included Cigarettes. She smoked .5 packs per day. She does not have any smokeless tobacco history on file. She reports that she does not drink alcohol or use illicit drugs.  Allergies: No Known Allergies  Medications: I have reviewed the patient's current medications.  Results for orders placed during the hospital encounter of 07/26/12 (from the past 48 hour(s))  BASIC METABOLIC PANEL     Status: Abnormal   Collection Time   07/26/12 10:43 AM      Component Value Range Comment   Sodium 135  135 - 145 mEq/L    Potassium 3.7  3.5 - 5.1 mEq/L    Chloride 100  96 - 112 mEq/L    CO2 25  19 - 32 mEq/L    Glucose, Bld 107 (*) 70 - 99 mg/dL    BUN 9  6 - 23 mg/dL    Creatinine, Ser 9.56  0.50 - 1.10 mg/dL    Calcium 21.3 (*)  8.4 - 10.5 mg/dL    GFR calc non Af Amer >90  >90 mL/min    GFR calc Af Amer >90  >90 mL/min   CBC WITH DIFFERENTIAL     Status: Abnormal   Collection Time   07/26/12 10:43 AM      Component Value Range Comment   WBC 14.6 (*) 4.0 - 10.5 K/uL    RBC 4.26  3.87 - 5.11 MIL/uL    Hemoglobin 12.5  12.0 - 15.0 g/dL    HCT 08.6  57.8 - 46.9 %    MCV 87.1  78.0 - 100.0 fL    MCH 29.3  26.0 - 34.0 pg    MCHC 33.7  30.0 - 36.0 g/dL    RDW 62.9  52.8 - 41.3 %    Platelets 316  150 - 400 K/uL    Neutrophils Relative 88 (*) 43 - 77 %    Neutro Abs 12.8 (*) 1.7 - 7.7 K/uL    Lymphocytes Relative 6 (*) 12 - 46 %    Lymphs Abs 0.9  0.7 - 4.0 K/uL    Monocytes Relative 6  3 - 12 %    Monocytes Absolute 0.9  0.1 - 1.0  K/uL    Eosinophils Relative 0  0 - 5 %    Eosinophils Absolute 0.0  0.0 - 0.7 K/uL    Basophils Relative 0  0 - 1 %    Basophils Absolute 0.0  0.0 - 0.1 K/uL   PROTIME-INR     Status: Normal   Collection Time   07/26/12 10:43 AM      Component Value Range Comment   Prothrombin Time 13.2  11.6 - 15.2 seconds    INR 0.98  0.00 - 1.49   TYPE AND SCREEN     Status: Normal   Collection Time   07/26/12 10:44 AM      Component Value Range Comment   ABO/RH(D) B NEG      Antibody Screen NEG      Sample Expiration 07/29/2012     ABO/RH     Status: Normal   Collection Time   07/26/12 10:44 AM      Component Value Range Comment   ABO/RH(D) B NEG     URINALYSIS, ROUTINE W REFLEX MICROSCOPIC     Status: Abnormal   Collection Time   07/26/12 11:51 AM      Component Value Range Comment   Color, Urine YELLOW  YELLOW    APPearance CLOUDY (*) CLEAR    Specific Gravity, Urine 1.015  1.005 - 1.030    pH 7.5  5.0 - 8.0    Glucose, UA NEGATIVE  NEGATIVE mg/dL    Hgb urine dipstick NEGATIVE  NEGATIVE    Bilirubin Urine NEGATIVE  NEGATIVE    Ketones, ur NEGATIVE  NEGATIVE mg/dL    Protein, ur NEGATIVE  NEGATIVE mg/dL    Urobilinogen, UA 0.2  0.0 - 1.0 mg/dL    Nitrite NEGATIVE  NEGATIVE     Leukocytes, UA NEGATIVE  NEGATIVE MICROSCOPIC NOT DONE ON URINES WITH NEGATIVE PROTEIN, BLOOD, LEUKOCYTES, NITRITE, OR GLUCOSE <1000 mg/dL.  HEPATIC FUNCTION PANEL     Status: Abnormal   Collection Time   07/26/12  3:03 PM      Component Value Range Comment   Total Protein 7.3  6.0 - 8.3 g/dL    Albumin 3.7  3.5 - 5.2 g/dL    AST 18  0 - 37 U/L    ALT 11  0 - 35 U/L    Alkaline Phosphatase 139 (*) 39 - 117 U/L    Total Bilirubin 0.6  0.3 - 1.2 mg/dL    Bilirubin, Direct 0.1  0.0 - 0.3 mg/dL    Indirect Bilirubin 0.5  0.3 - 0.9 mg/dL   MAGNESIUM     Status: Normal   Collection Time   07/26/12  3:03 PM      Component Value Range Comment   Magnesium 1.9  1.5 - 2.5 mg/dL   PHOSPHORUS     Status: Normal   Collection Time   07/26/12  3:03 PM      Component Value Range Comment   Phosphorus 2.5  2.3 - 4.6 mg/dL     Dg Chest 1 View  11/29/1094  *RADIOLOGY REPORT*  Clinical Data: Preoperative respiratory exam for right hip fracture.  CHEST - 1 VIEW  Comparison: 12/11/2009  Findings: No evidence of pulmonary infiltrate, edema or pleural effusion.  Heart size is normal.  No bony abnormalities identified.  IMPRESSION: No active disease in the chest.   Original Report Authenticated By: Reola Calkins, M.D.    Dg Hip Complete Right  07/26/2012  *RADIOLOGY REPORT*  Clinical Data:  Fall with right hip pain.  RIGHT HIP - COMPLETE 2+ VIEW  Comparison: None.  Findings: Acute and comminuted intertrochanteric fracture of the proximal right femur shows angulation and displacement.  There is medial displacement of the lesser trochanter.  No evidence of dislocation.  The rest of the bony pelvis appears intact.  IMPRESSION: Acute intertrochanteric fracture of the right hip.   Original Report Authenticated By: Reola Calkins, M.D.     Review of Systems  Constitutional: Negative.        Poor historian, ROS from chart due to patients confusion  HENT: Negative.   Gastrointestinal: Negative.   Musculoskeletal:  Positive for joint pain.       Right hip pain  Skin: Negative.   Endo/Heme/Allergies: Negative.   Psychiatric/Behavioral:       Confused, talkative, makes no sense.    Blood pressure 120/73, pulse 69, temperature 97.9 F (36.6 C), temperature source Oral, resp. rate 15, SpO2 97.00%. Physical Exam  Constitutional:       Thin , elderly, talkative and very confused. Sentences make no sense.   HENT:  Head: Atraumatic.  Eyes: Pupils are equal, round, and reactive to light.  Neck: Normal range of motion.  Cardiovascular: Normal rate.   Respiratory: Effort normal.  GI: Soft.  Musculoskeletal:       Right hip shor and ER. Pulses 2 plus  Skin: Skin is warm and dry.  Psychiatric:       dementia    Assessment/Plan: Right IT hip fx.   Plan surgery Wed at  3:30 pm . Discussed with husband planned procedure, all ?'s answered , he agrees to proceed and will come by Tuesday and sign op permit for surgery. Will use IT troch nail so if she gets up before it heals she is less likely to cause failure of fixation. She will not be able to follow PWB status.     Tajay Muzzy C 07/26/2012, 6:07 PM

## 2012-07-26 NOTE — H&P (Signed)
[facility] History and Physical  Pamela Blackburn ZOX:[id number] DOB: 05/01/1936 DOA: 07/26/2012  Referring physician: Dr. Rubin Payor PCP: Kaleen Mask, MD   Chief Complaint: Hip pain  HPI: Pamela Blackburn is a 76 y.o. female with history of hypertension, hyperlipidemia, pernicious anemia, mild coronary artery disease with history of questionable mild non-Q-wave MI in the past, remote history of tobacco abuse, dementia who is a nursing home resident at [facility] retirement home who presented to the ED with right hip pain. Patient is demented and a such cannot give a history. Patient is only on the same she's terribly sore in her right hip. History was obtained from the ED physician's notes and from information sent from the nursing facility. Per nursing facility and ED notes it was reported that patient normally wanders around the facility and insisted. It was reported that the patient fell earlier on in the morning of admission which was unwitnessed by staff however it was noted that patient was able to get back up and walk to her room on her own. It was noted that staff stated that patient did not hit her head based on report from other residents who witnessed the fall. It was noted at the facility the patient was not moving around as usual which prompted them to call EMS. Upon exam at nursing facility it was noted that patient was guarding her right hip and on palpation she screamed and as such patient was brought to the ED. In the ED x-rays which were done did show any acute right hip intertrochanteric fracture. ED physician spoke with Dr. Ophelia Charter of orthopedics will consult on the patient. We were called to admit the patient.   Review of Systems: The patient denies anorexia, fever, weight loss,, vision loss, decreased hearing, hoarseness, chest pain, syncope, dyspnea on exertion, peripheral edema, balance deficits, hemoptysis, abdominal pain, melena, hematochezia, severe  indigestion/heartburn, hematuria, incontinence, genital sores, muscle weakness, suspicious skin lesions, transient blindness, difficulty walking, depression, unusual weight change, abnormal bleeding, enlarged lymph nodes, angioedema, and breast masses.   Past Medical History  Diagnosis Date  . Anemia   . Osteoporosis   . Coronary artery disease   . Dementia   . Hypertension   . Leukocytosis 07/26/2012  . Hyperlipidemia 07/26/2012  . Pernicious anemia 07/26/2012  . HTN (hypertension) 07/26/2012  . CAD (coronary artery disease) 07/26/2012    Cath 12/26/05: LAD 10-15% mid stenosis and distally, LCIRC small and patent, RCA 15-20% mid stenosis. EF 45%, LV with mild anterolateral and mid inferior wall hypokinesia  Myoview : 12/13/2009: No evidence of inducible reversible ischemia EF 77%  . Anxiety 07/26/2012  . History of tobacco use 07/26/2012  . Hyperparathyroidism 07/26/2012  . Vitamin d deficiency 07/26/2012   Past Surgical History  Procedure Date  . Tonsillectomy    Social History:  reports that she has quit smoking. Her smoking use included Cigarettes. She smoked .5 packs per day. She does not have any smokeless tobacco history on file. She reports that she does not drink alcohol or use illicit drugs.  No Known Allergies  Family History  Problem Relation Age of Onset  . Breast cancer Mother   . Lung cancer Father      Prior to Admission medications   Medication Sig Start Date End Date Taking? Authorizing Provider  acetaminophen (TYLENOL) 650 MG CR tablet Take 1,300 mg by mouth every 12 (twelve) hours.   Yes Historical Provider, MD  alendronate (FOSAMAX) 70 MG tablet Take 70 mg by mouth  every 7 (seven) days. Take with a full glass of water on an empty stomach.   Yes Historical Provider, MD  dextromethorphan-guaiFENesin (ROBITUSSIN-DM) 10-100 MG/5ML liquid Take 5 mLs by mouth every 8 (eight) hours as needed. Cough and congestion   Yes Historical Provider, MD  ferrous sulfate 325 (65 FE) MG tablet  Take 325 mg by mouth daily with breakfast.   Yes Historical Provider, MD  LORazepam (ATIVAN) 0.5 MG tablet Take 0.5 mg by mouth every 12 (twelve) hours.   Yes Historical Provider, MD  metoprolol (LOPRESSOR) 50 MG tablet Take 50 mg by mouth daily.   Yes Historical Provider, MD  vitamin B-12 (CYANOCOBALAMIN) 1000 MCG tablet Take 1,000 mcg by mouth daily.   Yes Historical Provider, MD   Physical Exam: Filed Vitals:   07/26/12 1150 07/26/12 1200 07/26/12 1215 07/26/12 1230  BP:  140/74    Pulse:   53 53  Temp:      TempSrc:      Resp:  18 15 14   SpO2: 89%  98% 100%     General:  Laying in bed fidgeting around pleasantly demented in no acute cardiopulmonary distress.  Eyes: Pupils equal round and reactive to light and accommodation. Extraocular movements intact.  ENT: Clear, no lesions, no exudates. . Dry mucous membranes.  Neck: Neck is supple no lymphadenopathy  Cardiovascular: Regular rate and rhythm no murmurs rubs or gallops. No lower extremity edema. No JVD.  Respiratory: Clear to auscultation bilaterally in anterior lung fields.  Abdomen: Soft, nontender, nondistended.  Skin: No rashes or lesions noted.  Musculoskeletal: 5 out of 5 bilateral upper extremity strength  Psychiatric: Demented. Normal mood. Normal affect. Poor judgment. Poor insight.  Neurologic: Moving all extremities spontaneously except right lower extremity. Unable to assess a full neurological exam secondary to patient's dementia.  Extremities: Right lower extremity is externally rotated and right hip is very tender to palpation.  Labs on Admission:  Basic Metabolic Panel:  Lab 07/26/12 0865  NA 135  K 3.7  CL 100  CO2 25  GLUCOSE 107*  BUN 9  CREATININE 0.54  CALCIUM 10.9*  MG --  PHOS --   Liver Function Tests: No results found for this basename: AST:5,ALT:5,ALKPHOS:5,BILITOT:5,PROT:5,ALBUMIN:5 in the last 168 hours No results found for this basename: LIPASE:5,AMYLASE:5 in the last 168  hours No results found for this basename: AMMONIA:5 in the last 168 hours CBC:  Lab 07/26/12 1043  WBC 14.6*  NEUTROABS 12.8*  HGB 12.5  HCT 37.1  MCV 87.1  PLT 316   Cardiac Enzymes: No results found for this basename: CKTOTAL:5,CKMB:5,CKMBINDEX:5,TROPONINI:5 in the last 168 hours  BNP (last 3 results) No results found for this basename: PROBNP:3 in the last 8760 hours CBG: No results found for this basename: GLUCAP:5 in the last 168 hours  Radiological Exams on Admission: Dg Chest 1 View  07/26/2012  *RADIOLOGY REPORT*  Clinical Data: Preoperative respiratory exam for right hip fracture.  CHEST - 1 VIEW  Comparison: 12/11/2009  Findings: No evidence of pulmonary infiltrate, edema or pleural effusion.  Heart size is normal.  No bony abnormalities identified.  IMPRESSION: No active disease in the chest.   Original Report Authenticated By: Reola Calkins, M.D.    Dg Hip Complete Right  07/26/2012  *RADIOLOGY REPORT*  Clinical Data: Fall with right hip pain.  RIGHT HIP - COMPLETE 2+ VIEW  Comparison: None.  Findings: Acute and comminuted intertrochanteric fracture of the proximal right femur shows angulation and displacement.  There is  medial displacement of the lesser trochanter.  No evidence of dislocation.  The rest of the bony pelvis appears intact.  IMPRESSION: Acute intertrochanteric fracture of the right hip.   Original Report Authenticated By: Reola Calkins, M.D.     EKG: Independently reviewed. Normal sinus rhythm.  Assessment/Plan Principal Problem:  *Closed intertrochanteric fracture of right hip Active Problems:  Leukocytosis  Hypercalcemia  Dehydration  Hyperlipidemia  Pernicious anemia  Dementia  Osteoporosis  HTN (hypertension)  CAD (coronary artery disease)  Anxiety  Hyperparathyroidism  Vitamin d deficiency  #1 acute intertrochanteric right hip fracture Likely secondary to mechanical fall. Patient was noted to get up and ambulate to her room right  after her fall and there was no witnessed syncopal episode. Patient is not complaining of any chest pain. Patient does have a history of hypertension hyperlipidemia prior tobacco use and mild coronary artery disease. Patient however is denying any chest pain however she is demented. Patient did have a cardiac catheterization done in 2007 which showed 10-15% mid LAD stenosis and 10 and 15-20% mid RCA stenosis. Patient also did have a Myoview which was done in January of 2011 which was negative for any inducible ischemia with an EF of 77%. EKG shows a normal sinus rhythm. Based on patient's comorbidities and age patient is likely moderate risk for surgery however if surgery is not done patient will be bed ridden and will not be able to ambulate anymore. No further cardiac workup is needed at this time. We'll recommend continuing patient's beta blocker. Patient should be okay for surgery. We'll keep n.p.o. until seen by orthopedics. Orthopedics, Dr. Ophelia Charter has been consulted per ED physician and will be seeing the patient.   #2 dehydration Gentle IV fluid hydration.  #3 leukocytosis Likely a reactive leukocytosis secondary to problem #1. Chest x-ray is negative. Urinalysis is negative. No knee done about it at this time. We'll monitor.  #4 hypercalcemia/history of hyperparathyroidism Questionable etiology. Patient does have a history of vitamin D deficiency and is on Fosamax and also does have a history of hyperparathyroidism. Will hydrate with IV fluids. Will check a PTH and 9 eyes calcium. Will check a phosphorus and magnesium level. We'll check vitamin D levels. Will hold patient's Fosamax for now and follow. If worsening may consider IV pamidronate.  #5 hypertension Stable. Continue beta blocker.  #6 anxiety Continue home regimen of anxiolytics.  #7 coronary artery disease Stable. EKG shows a normal sinus rhythm. Will continue the patient's metoprolol.  #8 history of pernicious  anemia Stable.  #9 dementia  #10 prophylaxis We'll place on SCDs for DVT prophylaxis put in for now. Postop DVT prophylaxis will be deferred to orthopedics.  Code Status: Full for now no family at bedside. Family Communication: No family at bedside Disposition Plan: Admit to med surg  Time spent: 60 mins  [facility] Pager [phone]  If 7PM-7AM, please contact night-coverage [email or web address] Password [email or web address] 07/26/2012, 1:37 PM

## 2012-07-27 ENCOUNTER — Inpatient Hospital Stay (HOSPITAL_COMMUNITY): Payer: Medicare Other

## 2012-07-27 DIAGNOSIS — R509 Fever, unspecified: Secondary | ICD-10-CM

## 2012-07-27 DIAGNOSIS — E860 Dehydration: Secondary | ICD-10-CM

## 2012-07-27 DIAGNOSIS — S72143A Displaced intertrochanteric fracture of unspecified femur, initial encounter for closed fracture: Principal | ICD-10-CM

## 2012-07-27 DIAGNOSIS — F411 Generalized anxiety disorder: Secondary | ICD-10-CM

## 2012-07-27 LAB — CBC
HCT: 31.2 % — ABNORMAL LOW (ref 36.0–46.0)
MCH: 29 pg (ref 26.0–34.0)
MCV: 86.9 fL (ref 78.0–100.0)
Platelets: 245 10*3/uL (ref 150–400)
RBC: 3.59 MIL/uL — ABNORMAL LOW (ref 3.87–5.11)
RDW: 13.9 % (ref 11.5–15.5)

## 2012-07-27 LAB — VITAMIN B12: Vitamin B-12: 381 pg/mL (ref 211–911)

## 2012-07-27 LAB — URINALYSIS, ROUTINE W REFLEX MICROSCOPIC
Bilirubin Urine: NEGATIVE
Hgb urine dipstick: NEGATIVE
Ketones, ur: NEGATIVE mg/dL
Nitrite: NEGATIVE
pH: 7 (ref 5.0–8.0)

## 2012-07-27 LAB — BASIC METABOLIC PANEL
BUN: 8 mg/dL (ref 6–23)
CO2: 26 mEq/L (ref 19–32)
Calcium: 10.2 mg/dL (ref 8.4–10.5)
Chloride: 102 mEq/L (ref 96–112)
Creatinine, Ser: 0.6 mg/dL (ref 0.50–1.10)

## 2012-07-27 LAB — URINE CULTURE

## 2012-07-27 LAB — IRON AND TIBC: UIBC: 264 ug/dL (ref 125–400)

## 2012-07-27 LAB — FOLATE: Folate: 13.3 ng/mL

## 2012-07-27 MED ORDER — ENSURE COMPLETE PO LIQD
237.0000 mL | Freq: Two times a day (BID) | ORAL | Status: DC
  Administered 2012-07-29 – 2012-07-30 (×2): 237 mL via ORAL

## 2012-07-27 NOTE — Progress Notes (Signed)
Clinical Social Work Department BRIEF PSYCHOSOCIAL ASSESSMENT 07/27/2012  Patient:  Pamela Blackburn, Pamela Blackburn     Account Number:  [id number]     Admit date:  07/26/2012  Clinical Social Worker:  Candie Chroman  Date/Time:  07/27/2012 03:47 PM  Referred by:  Physician  Date Referred:  07/27/2012 Referred for  SNF Placement   Other Referral:   Interview type:  Family Other interview type:    PSYCHOSOCIAL DATA Living Status:  FACILITY Admitted from facility:   [facility] Level of care:  Assisted Living Primary support name:  Pamela Blackburn Primary support relationship to patient:  SPOUSE Degree of support available:   supportive    CURRENT CONCERNS Current Concerns  Post-Acute Placement   Other Concerns:    SOCIAL WORK ASSESSMENT / PLAN Pt is a 76 yr old female admitted from [facility]. Pt is confused. CSW met with spouse to assist with d/c planning. Surgery is pending for this pt. It is unlikely she can return to ALF following hospital d/c. Spouse is agreeable with SNF placement . CSW will initiate SNF search following surgery. Pt has [employer]. CSW will request prior approval for SNF placement.   Assessment/plan status:  Psychosocial Support/Ongoing Assessment of Needs Other assessment/ plan:   Information/referral to community resources:   SNF list provided.    PATIENT'S/FAMILY'S RESPONSE TO PLAN OF CARE: Spouse is agreeable with SNF placement if needed.    Cori Razor LCSW [phone]

## 2012-07-27 NOTE — Progress Notes (Signed)
INITIAL ADULT NUTRITION ASSESSMENT Date: 07/27/2012   Time: 4:30 PM Reason for Assessment: Consult  INTERVENTION: Ensure Complete BID. Sitter and nursing to continue to encourage increased meal intake. Will monitor.   ASSESSMENT: Female 76 y.o.  Dx: Closed intertrochanteric fracture of right hip  Food/Nutrition Related Hx: Pt with dementia, no family present, sitter present at bedside. Called facility she was at and staff reported she was on a regular diet, was not sure how pt was eating. Sitter reports pt ate 10% of breakfast and 50% of lunch and would benefit from a nutrition supplement. No weight recorded.   Hx:  Past Medical History  Diagnosis Date  . Anemia   . Osteoporosis   . Coronary artery disease   . Dementia   . Hypertension   . Leukocytosis 07/26/2012  . Hyperlipidemia 07/26/2012  . Pernicious anemia 07/26/2012  . HTN (hypertension) 07/26/2012  . CAD (coronary artery disease) 07/26/2012    Cath 12/26/05: LAD 10-15% mid stenosis and distally, LCIRC small and patent, RCA 15-20% mid stenosis. EF 45%, LV with mild anterolateral and mid inferior wall hypokinesia  Myoview : 12/13/2009: No evidence of inducible reversible ischemia EF 77%  . Anxiety 07/26/2012  . History of tobacco use 07/26/2012  . Hyperparathyroidism 07/26/2012  . Vitamin d deficiency 07/26/2012   Related Meds:  Scheduled Meds:   . antiseptic oral rinse  15 mL Mouth Rinse BID  . docusate sodium  100 mg Oral BID  . ferrous sulfate  325 mg Oral Q breakfast  . LORazepam  0.5 mg Oral Q12H  . metoprolol  50 mg Oral Daily  . vitamin B-12  1,000 mcg Oral Daily   Continuous Infusions:   . sodium chloride 75 mL/hr at 07/26/12 1700  . DISCONTD: sodium chloride     PRN Meds:.acetaminophen, guaiFENesin-dextromethorphan, LORazepam, morphine injection, morphine injection, oxyCODONE  Ht:  5' 6'' per RN  Wt:  No weight recorded  Ideal Wt:    160 lb   There is no height or weight on file to calculate BMI.   Labs:  CMP    Component Value Date/Time   NA 136 07/27/2012 0443   K 3.5 07/27/2012 0443   CL 102 07/27/2012 0443   CO2 26 07/27/2012 0443   GLUCOSE 103* 07/27/2012 0443   BUN 8 07/27/2012 0443   CREATININE 0.60 07/27/2012 0443   CALCIUM 10.2 07/27/2012 0443   PROT 7.3 07/26/2012 1503   ALBUMIN 3.7 07/26/2012 1503   AST 18 07/26/2012 1503   ALT 11 07/26/2012 1503   ALKPHOS 139* 07/26/2012 1503   BILITOT 0.6 07/26/2012 1503   GFRNONAA 88* 07/27/2012 0443   GFRAA >90 07/27/2012 0443    Intake/Output Summary (Last 24 hours) at 07/27/12 1631 Last data filed at 07/27/12 1406  Gross per 24 hour  Intake 1176.25 ml  Output   1575 ml  Net -398.75 ml   Last BM - PTA  Diet Order: General   IVF:    sodium chloride Last Rate: 75 mL/hr at 07/26/12 1700  DISCONTD: sodium chloride     Estimated Nutritional Needs: (rough estimate based on height, no weight available)   Kcal: 1500-1750 Protein: 70-90g Fluid: 1.5-1.7L  NUTRITION DIAGNOSIS: -Inadequate oral intake (NI-2.1).  Status: Ongoing  RELATED TO: dementia  AS EVIDENCE BY: 50% and less meal intake  MONITORING/EVALUATION(Goals): Pt to consume >75% of meals/supplements  EDUCATION NEEDS: -Education not appropriate at this time   Dietitian #: [phone]  DOCUMENTATION CODES Per approved criteria  -Not Applicable  Marshall Cork 07/27/2012, 4:30 PM

## 2012-07-27 NOTE — Progress Notes (Signed)
[facility] PROGRESS NOTE  ADDISSON FRATE ZOX:[id number] DOB: Dec 30, 1935 DOA: 07/26/2012 PCP: Kaleen Mask, MD  Assessment/Plan: Principal Problem:  *Closed intertrochanteric fracture of right hip Active Problems:  Leukocytosis  Hypercalcemia  Dehydration  Hyperlipidemia  Pernicious anemia  Dementia  Osteoporosis  HTN (hypertension)  CAD (coronary artery disease)  Anxiety  Hyperparathyroidism  Vitamin d deficiency  Fever  #1 acute intertrochanteric right hip fracture Likely secondary to mechanical fall. Patient with no witnessed syncopal episode. EKG was normal sinus rhythm. Patient for hip repair tomorrow afternoon per orthopedics.  #2 dehydration IV fluids  #3 fever Questionable etiology. Will repeat UA with cultures and sensitivities. Will repeat a chest x-ray. Will check blood cultures x2. Will monitor follow.  #4 leukocytosis Questionable etiology. Will panculture patient again secondary to problem #3. WBC is trending down. Follow for now.  #5 hypercalcemia/history of hyperparathyroidism Question bowel etiology. Patient's calcium levels have improved with hydration. PTH is pending. Ionized calcium is pending. Vitamin D. levels are decreased. Monitor for now.  #6 hypertension Stable. Continue beta blocker.  #7 coronary artery disease Stable. Continue metoprolol.  #8 anxiety Continue anxiolytics.  #9 history of pernicious anemia Hemoglobin has dropped to 10.4 from 12.5 on admission. Likely dilutional effect. Will check an anemia panel. Will guaiac stools. Follow H&H.  #10 dementia  #11 prophylaxis SCDs for DVT prophylaxis. Post surgery prophylaxis will be deferred to orthopedics.  Code Status: Full Family Communication: No family at bedside Disposition Plan: Skilled nursing facility when medically stable   Brief narrative: Pamela Blackburn is a 76 y.o. female with history of hypertension, hyperlipidemia, pernicious anemia, mild coronary  artery disease with history of questionable mild non-Q-wave MI in the past, remote history of tobacco abuse, dementia who is a nursing home resident at [facility] who presented to the ED with right hip pain. Patient is demented and a such cannot give a history. Patient is only on the same she's terribly sore in her right hip. History was obtained from the ED physician's notes and from information sent from the nursing facility. Per nursing facility and ED notes it was reported that patient normally wanders around the facility and insisted. It was reported that the patient fell earlier on in the morning of admission which was unwitnessed by staff however it was noted that patient was able to get back up and walk to her room on her own. It was noted that staff stated that patient did not hit her head based on report from other residents who witnessed the fall. It was noted at the facility the patient was not moving around as usual which prompted them to call EMS. Upon exam at nursing facility it was noted that patient was guarding her right hip and on palpation she screamed and as such patient was brought to the ED. In the ED x-rays which were done did show any acute right hip intertrochanteric fracture. ED physician spoke with Dr. Ophelia Charter of orthopedics will consult on the patient. We were called to admit the patient   Consultants:  Orthopedics: Dr. Ophelia Charter 07/26/2012  Procedures:  Chest x-ray 07/26/2012  X-ray of the right hip 07/26/2012  Antibiotics:  None  HPI/Subjective: Patient is pleasantly confused. Pulling at the mittens. With nonsensical speech.  Objective: Filed Vitals:   07/27/12 0530 07/27/12 1213 07/27/12 1405 07/27/12 1651  BP: 136/77 117/68 129/73   Pulse: 77 77 83   Temp: 98.4 F (36.9 C) 100.5 F (38.1 C) 101.1 F (38.4 C) 99 F (  37.2 C)  TempSrc:  Oral Oral Axillary  Resp: 12 22 14    SpO2: 94% 97% 95%     Intake/Output Summary (Last 24 hours) at  07/27/12 1654 Last data filed at 07/27/12 1406  Gross per 24 hour  Intake 1176.25 ml  Output   1575 ml  Net -398.75 ml   There were no vitals filed for this visit.  Exam:   General:  Patient is pleasantly confused in no distress  Cardiovascular: Regular rate rhythm no murmurs rubs or gallops  Respiratory: Clear to auscultation bilaterally in anterior lung fields  Abdomen: Soft nontender nondistended positive bowel sounds  Extremities: No clubbing cyanosis or edema. Right lower extremity is externally rotated.  Data Reviewed: Basic Metabolic Panel:  Lab 07/27/12 1610 07/26/12 1503 07/26/12 1043  NA 136 -- 135  K 3.5 -- 3.7  CL 102 -- 100  CO2 26 -- 25  GLUCOSE 103* -- 107*  BUN 8 -- 9  CREATININE 0.60 -- 0.54  CALCIUM 10.2 -- 10.9*  MG -- 1.9 --  PHOS -- 2.5 --   Liver Function Tests:  Lab 07/26/12 1503  AST 18  ALT 11  ALKPHOS 139*  BILITOT 0.6  PROT 7.3  ALBUMIN 3.7   No results found for this basename: LIPASE:5,AMYLASE:5 in the last 168 hours No results found for this basename: AMMONIA:5 in the last 168 hours CBC:  Lab 07/27/12 0443 07/26/12 1043  WBC 6.8 14.6*  NEUTROABS -- 12.8*  HGB 10.4* 12.5  HCT 31.2* 37.1  MCV 86.9 87.1  PLT 245 316   Cardiac Enzymes: No results found for this basename: CKTOTAL:5,CKMB:5,CKMBINDEX:5,TROPONINI:5 in the last 168 hours BNP (last 3 results) No results found for this basename: PROBNP:3 in the last 8760 hours CBG: No results found for this basename: GLUCAP:5 in the last 168 hours  No results found for this or any previous visit (from the past 240 hour(s)).   Studies: Dg Chest 1 View  07/26/2012  *RADIOLOGY REPORT*  Clinical Data: Preoperative respiratory exam for right hip fracture.  CHEST - 1 VIEW  Comparison: 12/11/2009  Findings: No evidence of pulmonary infiltrate, edema or pleural effusion.  Heart size is normal.  No bony abnormalities identified.  IMPRESSION: No active disease in the chest.   Original  Report Authenticated By: Reola Calkins, M.D.    Dg Hip Complete Right  07/26/2012  *RADIOLOGY REPORT*  Clinical Data: Fall with right hip pain.  RIGHT HIP - COMPLETE 2+ VIEW  Comparison: None.  Findings: Acute and comminuted intertrochanteric fracture of the proximal right femur shows angulation and displacement.  There is medial displacement of the lesser trochanter.  No evidence of dislocation.  The rest of the bony pelvis appears intact.  IMPRESSION: Acute intertrochanteric fracture of the right hip.   Original Report Authenticated By: Reola Calkins, M.D.     Scheduled Meds:   . antiseptic oral rinse  15 mL Mouth Rinse BID  . docusate sodium  100 mg Oral BID  . feeding supplement  237 mL Oral BID BM  . ferrous sulfate  325 mg Oral Q breakfast  . LORazepam  0.5 mg Oral Q12H  . metoprolol  50 mg Oral Daily  . vitamin B-12  1,000 mcg Oral Daily   Continuous Infusions:   . sodium chloride 75 mL/hr at 07/26/12 1700  . DISCONTD: sodium chloride      Principal Problem:  *Closed intertrochanteric fracture of right hip Active Problems:  Leukocytosis  Hypercalcemia  Dehydration  Hyperlipidemia  Pernicious anemia  Dementia  Osteoporosis  HTN (hypertension)  CAD (coronary artery disease)  Anxiety  Hyperparathyroidism  Vitamin d deficiency  Fever    Time spent: > 30 mins    [facility]  [facility] Pager [phone]. If 8PM-8AM, please contact night-coverage at [email or web address], password [facility] 07/27/2012, 4:54 PM  LOS: 1 day

## 2012-07-27 NOTE — Progress Notes (Signed)
Patient removed 2nd IV site since admission. Patient has regular diet ordered.  Paged Schorr NP for [facility], advised to leave IV out for now and rounding physician would determine if another needed to be inserted prior to surgery on Wednesday.

## 2012-07-27 NOTE — Progress Notes (Signed)
Pt confused unable to state goal for pain

## 2012-07-27 NOTE — Progress Notes (Signed)
1245 patient from [address] with sitter

## 2012-07-27 NOTE — Progress Notes (Signed)
Reported called and given to receiving RN on the 6th floor, notified patient's emergency contact Peggy that patient is being moved to the 6th floor room [address], Myrtie Hawk RN 07-27-2012

## 2012-07-28 ENCOUNTER — Inpatient Hospital Stay (HOSPITAL_COMMUNITY): Payer: Medicare Other

## 2012-07-28 ENCOUNTER — Inpatient Hospital Stay (HOSPITAL_COMMUNITY): Payer: Medicare Other | Admitting: Anesthesiology

## 2012-07-28 ENCOUNTER — Encounter (HOSPITAL_COMMUNITY): Payer: Self-pay | Admitting: Anesthesiology

## 2012-07-28 ENCOUNTER — Encounter (HOSPITAL_COMMUNITY)
Admission: EM | Disposition: A | Discharge status: Skilled Nursing Facility | Payer: Self-pay | Source: Home / Self Care | Attending: Internal Medicine

## 2012-07-28 DIAGNOSIS — I2510 Atherosclerotic heart disease of native coronary artery without angina pectoris: Secondary | ICD-10-CM

## 2012-07-28 DIAGNOSIS — I10 Essential (primary) hypertension: Secondary | ICD-10-CM

## 2012-07-28 LAB — CBC
HCT: 31.6 % — ABNORMAL LOW (ref 36.0–46.0)
MCH: 28.8 pg (ref 26.0–34.0)
MCV: 86.6 fL (ref 78.0–100.0)
Platelets: 236 10*3/uL (ref 150–400)
RDW: 13.9 % (ref 11.5–15.5)

## 2012-07-28 LAB — BASIC METABOLIC PANEL
BUN: 5 mg/dL — ABNORMAL LOW (ref 6–23)
CO2: 27 mEq/L (ref 19–32)
Calcium: 9.9 mg/dL (ref 8.4–10.5)
Creatinine, Ser: 0.57 mg/dL (ref 0.50–1.10)
Glucose, Bld: 110 mg/dL — ABNORMAL HIGH (ref 70–99)

## 2012-07-28 LAB — PTH, INTACT AND CALCIUM
Calcium, Total (PTH): 11 mg/dL — ABNORMAL HIGH (ref 8.4–10.5)
PTH: 191 pg/mL — ABNORMAL HIGH (ref 14.0–72.0)

## 2012-07-28 SURGERY — FIXATION, FRACTURE, INTERTROCHANTERIC, WITH INTRAMEDULLARY ROD
Anesthesia: General | Site: Hip | Laterality: Right | Wound class: Clean

## 2012-07-28 MED ORDER — MORPHINE SULFATE 2 MG/ML IJ SOLN
0.5000 mg | INTRAMUSCULAR | Status: DC | PRN
  Administered 2012-07-29 – 2012-07-30 (×3): 0.5 mg via INTRAVENOUS
  Filled 2012-07-28: qty 1

## 2012-07-28 MED ORDER — HYDROMORPHONE HCL PF 1 MG/ML IJ SOLN
0.2500 mg | INTRAMUSCULAR | Status: DC | PRN
  Administered 2012-07-28: 0.25 mg via INTRAVENOUS

## 2012-07-28 MED ORDER — LACTATED RINGERS IV SOLN: INTRAVENOUS | Status: DC

## 2012-07-28 MED ORDER — EPHEDRINE SULFATE 50 MG/ML IJ SOLN
INTRAMUSCULAR | Status: DC | PRN
  Administered 2012-07-28: 10 mg via INTRAVENOUS

## 2012-07-28 MED ORDER — PROPOFOL 10 MG/ML IV BOLUS
INTRAVENOUS | Status: DC | PRN
  Administered 2012-07-28: 120 mg via INTRAVENOUS

## 2012-07-28 MED ORDER — LIDOCAINE HCL (CARDIAC) 20 MG/ML IV SOLN
INTRAVENOUS | Status: DC | PRN
  Administered 2012-07-28: 60 mg via INTRAVENOUS

## 2012-07-28 MED ORDER — PHENOL 1.4 % MT LIQD
1.0000 | OROMUCOSAL | Status: DC | PRN
  Filled 2012-07-28: qty 177

## 2012-07-28 MED ORDER — ONDANSETRON HCL 4 MG PO TABS: 4.0000 mg | ORAL_TABLET | Freq: Four times a day (QID) | ORAL | Status: DC | PRN

## 2012-07-28 MED ORDER — MENTHOL 3 MG MT LOZG
1.0000 | LOZENGE | OROMUCOSAL | Status: DC | PRN
  Filled 2012-07-28: qty 9

## 2012-07-28 MED ORDER — CEFAZOLIN SODIUM-DEXTROSE 2-3 GM-% IV SOLR
INTRAVENOUS | Status: DC | PRN
  Administered 2012-07-28: 2 g via INTRAVENOUS

## 2012-07-28 MED ORDER — ACETAMINOPHEN 10 MG/ML IV SOLN
INTRAVENOUS | Status: DC | PRN
  Administered 2012-07-28: 1000 mg via INTRAVENOUS

## 2012-07-28 MED ORDER — SUCCINYLCHOLINE CHLORIDE 20 MG/ML IJ SOLN
INTRAMUSCULAR | Status: DC | PRN
  Administered 2012-07-28: 100 mg via INTRAVENOUS

## 2012-07-28 MED ORDER — HYDROCODONE-ACETAMINOPHEN 5-325 MG PO TABS
1.0000 | ORAL_TABLET | Freq: Four times a day (QID) | ORAL | Status: DC | PRN
  Administered 2012-08-01: 1 via ORAL
  Filled 2012-07-28: qty 1

## 2012-07-28 MED ORDER — METOCLOPRAMIDE HCL 10 MG PO TABS: 5.0000 mg | ORAL_TABLET | Freq: Three times a day (TID) | ORAL | Status: DC | PRN

## 2012-07-28 MED ORDER — ENOXAPARIN SODIUM 40 MG/0.4ML ~~LOC~~ SOLN
40.0000 mg | SUBCUTANEOUS | Status: DC
  Administered 2012-07-29 – 2012-08-02 (×5): 40 mg via SUBCUTANEOUS
  Filled 2012-07-28 (×7): qty 0.4

## 2012-07-28 MED ORDER — POTASSIUM CHLORIDE CRYS ER 20 MEQ PO TBCR
40.0000 meq | EXTENDED_RELEASE_TABLET | Freq: Once | ORAL | Status: DC
  Filled 2012-07-28: qty 2

## 2012-07-28 MED ORDER — LACTATED RINGERS IV SOLN
INTRAVENOUS | Status: DC | PRN
  Administered 2012-07-28 (×2): via INTRAVENOUS

## 2012-07-28 MED ORDER — KCL IN DEXTROSE-NACL 20-5-0.45 MEQ/L-%-% IV SOLN
INTRAVENOUS | Status: DC
  Administered 2012-07-28 – 2012-07-30 (×3): via INTRAVENOUS
  Administered 2012-07-30: 75 mL/h via INTRAVENOUS
  Administered 2012-07-31: 07:00:00 via INTRAVENOUS
  Filled 2012-07-28 (×8): qty 1000

## 2012-07-28 MED ORDER — ACETAMINOPHEN 325 MG PO TABS: 650.0000 mg | ORAL_TABLET | Freq: Four times a day (QID) | ORAL | Status: DC | PRN

## 2012-07-28 MED ORDER — CEFAZOLIN SODIUM-DEXTROSE 2-3 GM-% IV SOLR
2.0000 g | Freq: Four times a day (QID) | INTRAVENOUS | Status: AC
  Administered 2012-07-28 – 2012-07-29 (×2): 2 g via INTRAVENOUS
  Filled 2012-07-28 (×2): qty 50

## 2012-07-28 MED ORDER — FENTANYL CITRATE 0.05 MG/ML IJ SOLN
INTRAMUSCULAR | Status: DC | PRN
  Administered 2012-07-28 (×3): 50 ug via INTRAVENOUS

## 2012-07-28 MED ORDER — METOCLOPRAMIDE HCL 5 MG/ML IJ SOLN: 5.0000 mg | Freq: Three times a day (TID) | INTRAMUSCULAR | Status: DC | PRN

## 2012-07-28 MED ORDER — POTASSIUM CHLORIDE CRYS ER 20 MEQ PO TBCR
40.0000 meq | EXTENDED_RELEASE_TABLET | Freq: Once | ORAL | Status: AC
  Administered 2012-07-28: 40 meq via ORAL
  Filled 2012-07-28: qty 2

## 2012-07-28 MED ORDER — ONDANSETRON HCL 4 MG/2ML IJ SOLN: 4.0000 mg | Freq: Four times a day (QID) | INTRAMUSCULAR | Status: DC | PRN

## 2012-07-28 MED ORDER — ACETAMINOPHEN 650 MG RE SUPP: 650.0000 mg | Freq: Four times a day (QID) | RECTAL | Status: DC | PRN

## 2012-07-28 SURGICAL SUPPLY — 44 items
BAG SPEC THK2 15X12 ZIP CLS (MISCELLANEOUS) ×1
BAG ZIPLOCK 12X15 (MISCELLANEOUS) ×2 IMPLANT
BIOMET ×1 IMPLANT
BIT DRILL 4.3MMS DISTAL GRDTED (BIT) IMPLANT
BNDG COHESIVE 6X5 TAN STRL LF (GAUZE/BANDAGES/DRESSINGS) ×2 IMPLANT
CLOTH BEACON ORANGE TIMEOUT ST (SAFETY) ×2 IMPLANT
DRAPE C-ARM 42X72 X-RAY (DRAPES) ×2 IMPLANT
DRAPE INCISE IOBAN 66X45 STRL (DRAPES) ×2 IMPLANT
DRAPE LG THREE QUARTER DISP (DRAPES) ×2 IMPLANT
DRAPE ORTHO SPLIT 77X108 STRL (DRAPES) ×2
DRAPE STERI IOBAN 125X83 (DRAPES) ×2 IMPLANT
DRAPE SURG ORHT 6 SPLT 77X108 (DRAPES) ×1 IMPLANT
DRAPE U-SHAPE 47X51 STRL (DRAPES) ×2 IMPLANT
DRILL 4.3MMS DISTAL GRADUATED (BIT) ×2
DRSG ADAPTIC 3X8 NADH LF (GAUZE/BANDAGES/DRESSINGS) ×1 IMPLANT
DRSG PAD ABDOMINAL 8X10 ST (GAUZE/BANDAGES/DRESSINGS) ×2 IMPLANT
DURAPREP 26ML APPLICATOR (WOUND CARE) ×2 IMPLANT
ELECT REM PT RETURN 9FT ADLT (ELECTROSURGICAL) ×2
ELECTRODE REM PT RTRN 9FT ADLT (ELECTROSURGICAL) ×1 IMPLANT
FACESHIELD LNG OPTICON STERILE (SAFETY) ×4 IMPLANT
GLOVE ORTHO TXT STRL SZ7.5 (GLOVE) ×2 IMPLANT
GLOVE SURG ORTHO 8.5 STRL (GLOVE) ×2 IMPLANT
GOWN STRL NON-REIN LRG LVL3 (GOWN DISPOSABLE) ×4 IMPLANT
GUIDEPIN 3.2X17.5 THRD DISP (PIN) ×1 IMPLANT
GUIDEWIRE BALL NOSE 80CM (WIRE) ×1 IMPLANT
KIT BASIN OR (CUSTOM PROCEDURE TRAY) ×2 IMPLANT
MANIFOLD NEPTUNE II (INSTRUMENTS) ×2 IMPLANT
NAIL AFFIXUS RT 11X340MM (Nail) ×1 IMPLANT
PACK GENERAL/GYN (CUSTOM PROCEDURE TRAY) ×2 IMPLANT
PACK LOWER EXTREMITY WL (CUSTOM PROCEDURE TRAY) ×2 IMPLANT
PAD CAST 4YDX4 CTTN HI CHSV (CAST SUPPLIES) ×1 IMPLANT
PADDING CAST COTTON 4X4 STRL (CAST SUPPLIES) ×2
POSITIONER SURGICAL ARM (MISCELLANEOUS) ×2 IMPLANT
SCREW BONE CORTICAL 5.0X38 (Screw) ×1 IMPLANT
SCREW LAG 10.5MMX105MM HFN (Screw) ×1 IMPLANT
SCREWDRIVER HEX TIP 3.5MM (MISCELLANEOUS) ×1 IMPLANT
SPONGE GAUZE 4X4 12PLY (GAUZE/BANDAGES/DRESSINGS) ×2 IMPLANT
STAPLER VISISTAT (STAPLE) ×2 IMPLANT
STOCKINETTE 8 INCH (MISCELLANEOUS) ×2 IMPLANT
SUT VIC AB 0 CT1 36 (SUTURE) ×4 IMPLANT
SUT VIC AB 2-0 CT1 27 (SUTURE) ×2
SUT VIC AB 2-0 CT1 TAPERPNT 27 (SUTURE) ×1 IMPLANT
TAPE CLOTH SURG 4X10 WHT LF (GAUZE/BANDAGES/DRESSINGS) ×1 IMPLANT
TOWEL OR 17X26 10 PK STRL BLUE (TOWEL DISPOSABLE) ×4 IMPLANT

## 2012-07-28 NOTE — Anesthesia Preprocedure Evaluation (Signed)
Anesthesia Evaluation  Patient identified by MRN, date of birth, ID band Patient awake    Reviewed: Allergy & Precautions, H&P , NPO status , Patient's Chart, lab work & pertinent test results, reviewed documented beta blocker date and time   Airway Mallampati: II TM Distance: >3 FB Neck ROM: full    Dental  (+) Edentulous Upper and Dental Advisory Given   Pulmonary neg pulmonary ROS, former smoker,  breath sounds clear to auscultation  Pulmonary exam normal       Cardiovascular Exercise Tolerance: Poor hypertension, Pt. on home beta blockers + CAD negative cardio ROS  Rhythm:regular Rate:Normal  Mild CAD on 2007 cath.  1/11 cardiolite normal with great EF.   Neuro/Psych Anxiety Dementia - severe negative neurological ROS  negative psych ROS   GI/Hepatic negative GI ROS, Neg liver ROS,   Endo/Other  negative endocrine ROSHyperthyroidism   Renal/GU negative Renal ROS  negative genitourinary   Musculoskeletal   Abdominal   Peds  Hematology negative hematology ROS (+) pernicous anemia   Anesthesia Other Findings   Reproductive/Obstetrics negative OB ROS                           Anesthesia Physical Anesthesia Plan  ASA: III  Anesthesia Plan: General   Post-op Pain Management:    Induction: Intravenous  Airway Management Planned: Oral ETT  Additional Equipment:   Intra-op Plan:   Post-operative Plan: Extubation in OR  Informed Consent: I have reviewed the patients History and Physical, chart, labs and discussed the procedure including the risks, benefits and alternatives for the proposed anesthesia with the patient or authorized representative who has indicated his/her understanding and acceptance.   Dental Advisory Given  Plan Discussed with: CRNA and Surgeon  Anesthesia Plan Comments:         Anesthesia Quick Evaluation

## 2012-07-28 NOTE — Progress Notes (Signed)
[facility] PROGRESS NOTE  Pamela Blackburn Pamela Blackburn DOB: [birth year] DOA: 07/26/2012 PCP: Pamela Mask, MD  Assessment/Plan: Principal Problem:  *Closed intertrochanteric fracture of right hip Active Problems:  Leukocytosis  Hypercalcemia  Dehydration  Hyperlipidemia  Pernicious anemia  Dementia  Osteoporosis  HTN (hypertension)  CAD (coronary artery disease)  Anxiety  Hyperparathyroidism  Vitamin d deficiency  Fever  Right hip fracture, intertrochanteric -Likely secondary to mechanical fall. Patient with no witnessed syncopal episode. -For surgical hip repair today. -SCDs for DVT prophylaxis.  Dehydration IV fluids  Dementia -Patient has known dementia, she has nonsensical speech. -She was confused last night  Fever Questionable etiology. Will repeat UA with cultures and sensitivities. Will repeat a chest x-ray. Will check blood cultures x2. Will monitor follow.  Leukocytosis Questionable etiology. Will panculture patient again secondary to problem #3. WBC is trending down. Follow for now.  Hypercalcemia/history of hyperparathyroidism Question bowel etiology. Patient's calcium levels have improved with hydration. PTH is pending. Ionized calcium is pending. Vitamin D. levels are decreased. Monitor for now.  Hypertension Stable. Continue beta blocker.  History of pernicious anemia Hemoglobin has dropped to 10.4 from 12.5 on admission. Likely dilutional effect. Will check an anemia panel. Will guaiac stools. Follow H&H.   Code Status: Full Family Communication: No family at bedside Disposition Plan: Skilled nursing facility when medically stable   Brief narrative: Pamela Blackburn is a 76 y.o. female with history of hypertension, hyperlipidemia, pernicious anemia, mild coronary artery disease with history of questionable mild non-Q-wave MI in the past, remote history of tobacco abuse, dementia who is a nursing home resident at [facility]  retirement home who presented to the ED with right hip pain. Patient is demented and a such cannot give a history. Patient is only on the same she's terribly sore in her right hip. History was obtained from the ED physician's notes and from information sent from the nursing facility. Per nursing facility and ED notes it was reported that patient normally wanders around the facility and insisted. It was reported that the patient fell earlier on in the morning of admission which was unwitnessed by staff however it was noted that patient was able to get back up and walk to her room on her own. It was noted that staff stated that patient did not hit her head based on report from other residents who witnessed the fall. It was noted at the facility the patient was not moving around as usual which prompted them to call EMS. Upon exam at nursing facility it was noted that patient was guarding her right hip and on palpation she screamed and as such patient was brought to the ED. In the ED x-rays which were done did show any acute right hip intertrochanteric fracture.   Consultants:  Orthopedics: Dr. Ophelia Charter 07/26/2012  Procedures:  Chest x-ray 07/26/2012  X-ray of the right hip 07/26/2012  Antibiotics:  None  HPI/Subjective: Patient is pleasantly confused. Pulling at the mittens. With nonsensical speech.  Objective: Filed Vitals:   07/27/12 1651 07/27/12 2153 07/28/12 0600 07/28/12 1025  BP:  150/81 109/76 115/71  Pulse:  105 80 88  Temp: 99 F (37.2 C) 99.2 F (37.3 C) 97.5 F (36.4 C) 98.9 F (37.2 C)  TempSrc: Axillary   Oral  Resp:  18 16 16   SpO2:  95% 97% 95%    Intake/Output Summary (Last 24 hours) at 07/28/12 1231 Last data filed at 07/28/12 6010  Gross per 24 hour  Intake   2310  ml  Output   1800 ml  Net    510 ml   There were no vitals filed for this visit.  Exam:   General:  Patient is pleasantly confused in no distress  Cardiovascular: Regular rate rhythm no murmurs  rubs or gallops  Respiratory: Clear to auscultation bilaterally in anterior lung fields  Abdomen: Soft nontender nondistended positive bowel sounds  Extremities: No clubbing cyanosis or edema. Right lower extremity is externally rotated.  Data Reviewed: Basic Metabolic Panel:  Lab 07/28/12 1191 07/27/12 0443 07/26/12 1503 07/26/12 1043  NA 135 136 -- 135  K 3.6 3.5 -- 3.7  CL 101 102 -- 100  CO2 27 26 -- 25  GLUCOSE 110* 103* -- 107*  BUN 5* 8 -- 9  CREATININE 0.57 0.60 -- 0.54  CALCIUM 9.9 10.2 -- 10.9*  MG -- -- 1.9 --  PHOS -- -- 2.5 --   Liver Function Tests:  Lab 07/26/12 1503  AST 18  ALT 11  ALKPHOS 139*  BILITOT 0.6  PROT 7.3  ALBUMIN 3.7   No results found for this basename: LIPASE:5,AMYLASE:5 in the last 168 hours No results found for this basename: AMMONIA:5 in the last 168 hours CBC:  Lab 07/28/12 0355 07/27/12 0443 07/26/12 1043  WBC 7.1 6.8 14.6*  NEUTROABS -- -- 12.8*  HGB 10.5* 10.4* 12.5  HCT 31.6* 31.2* 37.1  MCV 86.6 86.9 87.1  PLT 236 245 316   Cardiac Enzymes: No results found for this basename: CKTOTAL:5,CKMB:5,CKMBINDEX:5,TROPONINI:5 in the last 168 hours BNP (last 3 results) No results found for this basename: PROBNP:3 in the last 8760 hours CBG: No results found for this basename: GLUCAP:5 in the last 168 hours  Recent Results (from the past 240 hour(s))  URINE CULTURE     Status: Normal   Collection Time   07/26/12 11:51 AM      Component Value Range Status Comment   Specimen Description URINE, CATHETERIZED   Final    Special Requests NONE   Final    Culture  Setup Time 07/27/2012 03:06   Final    Colony Count NO GROWTH   Final    Culture NO GROWTH   Final    Report Status 07/27/2012 FINAL   Final   CULTURE, BLOOD (ROUTINE X 2)     Status: Normal (Preliminary result)   Collection Time   07/27/12  5:08 PM      Component Value Range Status Comment   Specimen Description BLOOD LEFT ARM   Final    Special Requests BOTTLES DRAWN  AEROBIC ONLY 5CC   Final    Culture  Setup Time 07/27/2012 20:40   Final    Culture     Final    Value:        BLOOD CULTURE RECEIVED NO GROWTH TO DATE CULTURE WILL BE HELD FOR 5 DAYS BEFORE ISSUING A FINAL NEGATIVE REPORT   Report Status PENDING   Incomplete   CULTURE, BLOOD (ROUTINE X 2)     Status: Normal (Preliminary result)   Collection Time   07/27/12  5:10 PM      Component Value Range Status Comment   Specimen Description BLOOD LEFT WRIST   Final    Special Requests BOTTLES DRAWN AEROBIC ONLY 3CC   Final    Culture  Setup Time 07/27/2012 20:40   Final    Culture     Final    Value:        BLOOD CULTURE RECEIVED NO  GROWTH TO DATE CULTURE WILL BE HELD FOR 5 DAYS BEFORE ISSUING A FINAL NEGATIVE REPORT   Report Status PENDING   Incomplete      Studies: Dg Chest 1 View  07/26/2012  *RADIOLOGY REPORT*  Clinical Data: Preoperative respiratory exam for right hip fracture.  CHEST - 1 VIEW  Comparison: 12/11/2009  Findings: No evidence of pulmonary infiltrate, edema or pleural effusion.  Heart size is normal.  No bony abnormalities identified.  IMPRESSION: No active disease in the chest.   Original Report Authenticated By: Reola Calkins, M.D.    Dg Hip Complete Right  07/26/2012  *RADIOLOGY REPORT*  Clinical Data: Fall with right hip pain.  RIGHT HIP - COMPLETE 2+ VIEW  Comparison: None.  Findings: Acute and comminuted intertrochanteric fracture of the proximal right femur shows angulation and displacement.  There is medial displacement of the lesser trochanter.  No evidence of dislocation.  The rest of the bony pelvis appears intact.  IMPRESSION: Acute intertrochanteric fracture of the right hip.   Original Report Authenticated By: Reola Calkins, M.D.     Scheduled Meds:    . antiseptic oral rinse  15 mL Mouth Rinse BID  . docusate sodium  100 mg Oral BID  . feeding supplement  237 mL Oral BID BM  . ferrous sulfate  325 mg Oral Q breakfast  . LORazepam  0.5 mg Oral Q12H  .  metoprolol  50 mg Oral Daily  . vitamin B-12  1,000 mcg Oral Daily   Continuous Infusions:    . sodium chloride 75 mL/hr at 07/28/12 0946    Principal Problem:  *Closed intertrochanteric fracture of right hip Active Problems:  Leukocytosis  Hypercalcemia  Dehydration  Hyperlipidemia  Pernicious anemia  Dementia  Osteoporosis  HTN (hypertension)  CAD (coronary artery disease)  Anxiety  Hyperparathyroidism  Vitamin d deficiency  Fever    Time spent: > 30 mins    [facility] A  [facility] Pager [phone]. If 8PM-8AM, please contact night-coverage at [email or web address], password [facility] 07/28/2012, 12:31 PM  LOS: 2 days

## 2012-07-28 NOTE — Transfer of Care (Signed)
Immediate Anesthesia Transfer of Care Note  Patient: Pamela Blackburn  Procedure(s) Performed: Procedure(s) (LRB) with comments: INTRAMEDULLARY (IM) NAIL INTERTROCHANTERIC (Right)  Patient Location: PACU  Anesthesia Type: General  Level of Consciousness: sedated and confused  Airway & Oxygen Therapy: Patient Spontanous Breathing and Patient connected to face mask oxygen  Post-op Assessment: Report given to PACU RN and Post -op Vital signs reviewed and stable  Post vital signs: Reviewed and stable  Complications: No apparent anesthesia complications

## 2012-07-28 NOTE — Brief Op Note (Signed)
07/26/2012 - 07/28/2012  6:25 PM  PATIENT:  Pamela Blackburn  76 y.o. female  PRE-OPERATIVE DIAGNOSIS:  right hip fracture  POST-OPERATIVE DIAGNOSIS:  right hip fracture  PROCEDURE:  Procedure(s) (LRB) with comments: INTRAMEDULLARY (IM) NAIL INTERTROCHANTERIC (Right) biomet affixis  nail,  105 lag screw  SURGEON:  Surgeon(s) and Role:    * Eldred Manges, MD - Primary  PHYSICIAN ASSISTANT:   ASSISTANTS: none   ANESTHESIA:   general  EBL:  Total I/O In: 1800 [I.V.:1800] Out: 1000 [Urine:1000]  BLOOD ADMINISTERED:none  DRAINS: none   LOCAL MEDICATIONS USED:  NONE  SPECIMEN:  No Specimen  DISPOSITION OF SPECIMEN:  N/A  COUNTS:  YES  TOURNIQUET:  * No tourniquets in log *  DICTATION: .Other Dictation: Dictation Number [id number]  PLAN OF CARE: still inpatient  PATIENT DISPOSITION:  PACU - hemodynamically stable.   Delay start of Pharmacological VTE agent (>24hrs) due to surgical blood loss or risk of bleeding: yes

## 2012-07-28 NOTE — Anesthesia Postprocedure Evaluation (Signed)
  Anesthesia Post-op Note  Patient: Pamela Blackburn  Procedure(s) Performed: Procedure(s) (LRB): INTRAMEDULLARY (IM) NAIL INTERTROCHANTERIC (Right)  Patient Location: PACU  Anesthesia Type: General  Level of Consciousness: awake and alert   Airway and Oxygen Therapy: Patient Spontanous Breathing  Post-op Pain: mild  Post-op Assessment: Post-op Vital signs reviewed, Patient's Cardiovascular Status Stable, Respiratory Function Stable, Patent Airway and No signs of Nausea or vomiting  Post-op Vital Signs: stable  Complications: No apparent anesthesia complications

## 2012-07-29 LAB — CBC
Hemoglobin: 9.4 g/dL — ABNORMAL LOW (ref 12.0–15.0)
MCH: 29 pg (ref 26.0–34.0)
MCV: 86.7 fL (ref 78.0–100.0)
Platelets: 227 10*3/uL (ref 150–400)
RBC: 3.24 MIL/uL — ABNORMAL LOW (ref 3.87–5.11)
WBC: 8.1 10*3/uL (ref 4.0–10.5)

## 2012-07-29 LAB — URINE CULTURE

## 2012-07-29 LAB — BASIC METABOLIC PANEL
CO2: 28 mEq/L (ref 19–32)
Calcium: 9.7 mg/dL (ref 8.4–10.5)
Glucose, Bld: 136 mg/dL — ABNORMAL HIGH (ref 70–99)
Potassium: 4.1 mEq/L (ref 3.5–5.1)
Sodium: 135 mEq/L (ref 135–145)

## 2012-07-29 NOTE — Progress Notes (Signed)
CSW /MD/spouse met to review d/c plan. PT eval reviewed with ALF [facility] ). ALF feels they are able to meet pt's needs. Spouse would like pt to return to [facility] when stable. CSW will continue to follow to assist with d/c planning needs.  Cori Razor  LCSW [phone]

## 2012-07-29 NOTE — Op Note (Signed)
Pamela Blackburn, Pamela Blackburn                ACCOUNT NO.:  [id number]  MEDICAL RECORD NO.:  [id number]  LOCATION:                                 FACILITY:  PHYSICIAN:  Erven Ramson C. Ophelia Charter, M.D.    DATE OF BIRTH:  [birth year]  DATE OF PROCEDURE:  07/28/2012 DATE OF DISCHARGE:                              OPERATIVE REPORT   PREOPERATIVE DIAGNOSIS:  Comminuted right intertrochanteric hip fracture.  POSTOPERATIVE DIAGNOSIS:  Comminuted right intertrochanteric hip fracture.  PROCEDURE:  Intramedullary long hip screw with proximal and distal interlock.  SURGEON:  Breaunna Gottlieb C. Ophelia Charter, M.D.  ANESTHESIA:  GOT.  ESTIMATED BLOOD LOSS:  150 mL.  DRAINS:  None.  COMPONENTS USED:  340 Affixes, troch nail with 105 degree lag screw locked and distal 38 mm interlock.  DRAINS:  None.  PROCEDURE:  After induction of general anesthesia, the patient placed on the fracture table, reduction was performed, well leg holder was used. Internal rotation and traction reduction checked under fluoroscopy. Prepping with DuraPrep.  Area squared with towels.  Large shower curtain, Betadine, Steri-Drape was applied after squaring the area with towels and time-out procedure was completed.  Incision was made proximal to the trochanter.  Gluteus medius was split in line with the fibers. _steinman pin_________ was started at the tip of the trochanter under fluoroscopy, drilled, over reamed and then beaded_ tip rod was bent slightly and passed on to the knee at the midportion of the patella.  Measurement was made.  Appropriate length was selected as above.  Nail was inserted due to the wide intramedullary canal.  No reaming was necessary.  The nail was passed down to the knee, checked AP and lateral to make sure it was in good position.  Proximal interlock guide was used for the proximal interlock, 115 was measured, 105 was inserted, locked down tightly, and then backed off a quarter turn once it was secured, checked 2 views  and then freehand technique for distal interlock with a 38 mm screw based on depth gauge measurements using the depth gauge.  Final spot pictures showed the distal interlock was in the nail, AP showed there is good purchase of the opposite cortex.  Alignment looked good.  Screw was center-center in the nail and after irrigation, standard layered closure was performed with 0-Vicryl in deep fascia, 2-0 Vicryl subcutaneous tissue, skin staple closure.  Postop dressing, Adaptic, 4 x 4s, ABD, and tape.  Time-out procedure was completed at the end of the case, confirming procedure as planned and instrument count and needle count was correct.     Anjanette Gilkey C. Ophelia Charter, M.D.     MCY/MEDQ  D:  07/28/2012  T:  07/29/2012  Job:  [id number]

## 2012-07-29 NOTE — Evaluation (Signed)
Physical Therapy Evaluation Patient Details Name: Pamela Blackburn MRN: [id number] DOB: 09-Nov-1936 Today's Date: 07/29/2012 Time: 1025-1050 PT Time Calculation (min): 25 min  PT Assessment / Plan / Recommendation Clinical Impression  76 yo female admitted with fall at ALF. S/p R hip intertrochanteric IM nailing. Hx of dementia. Pt did not follow commands/cues during session. Resistant to assistance from therapists. Totally dependent for mobility, +2 assist. Recommend SNF for rehab.     PT Assessment  Patient needs continued PT services    Follow Up Recommendations  Skilled nursing facility;Supervision/Assistance - 24 hour    Barriers to Discharge Decreased caregiver support      Equipment Recommendations   (If pt discharges to ALF, will likely need wheelchair, hospital bed, lift equipment, ambulance transport)    Recommendations for Other Services     Frequency Min 3X/week    Precautions / Restrictions Precautions Precautions: Fall Restrictions Weight Bearing Restrictions: No RLE Weight Bearing: Weight bearing as tolerated   Pertinent Vitals/Pain Yells out in pain when moved      Mobility  Bed Mobility Bed Mobility: Supine to Sit Supine to Sit: 1: +2 Total assist;HOB elevated Supine to Sit: Patient Percentage: 0% Details for Bed Mobility Assistance: Assist for trunk to upright and bil LES off bed. Utililzed bedpad for scooting, positioning. Pt resistant to movement.  Transfers Transfers: Sit to Stand;Stand to Sit;Squat Pivot Transfers Sit to Stand: 1: +2 Total assist Sit to Stand: Patient Percentage: 0% Stand to Sit: 1: +2 Total assist Stand to Sit: Patient Percentage: 0% Squat Pivot Transfers: 1: +2 Total assist Squat Pivot Transfers: Patient Percentage: 0% Details for Transfer Assistance: Attempted sit>stand from bed with RW. Pt pushes walker away and resists assistance. squat-pivot towards L side bed>recliner. Lift pad placed under pt in recliner.   Ambulation/Gait Ambulation/Gait Assistance: Not tested (comment)    Exercises     PT Diagnosis: Difficulty walking;Acute pain;Altered mental status  PT Problem List: Decreased strength;Decreased range of motion;Decreased activity tolerance;Decreased balance;Decreased mobility;Pain;Decreased knowledge of use of DME;Decreased cognition PT Treatment Interventions: Gait training;DME instruction;Functional mobility training;Therapeutic activities;Therapeutic exercise;Balance training;Patient/family education   PT Goals Acute Rehab PT Goals PT Goal Formulation: Patient unable to participate in goal setting Time For Goal Achievement: 08/12/12 Potential to Achieve Goals: Fair Pt will go Supine/Side to Sit: with max assist PT Goal: Supine/Side to Sit - Progress: Goal set today Pt will go Sit to Supine/Side: with max assist PT Goal: Sit to Supine/Side - Progress: Goal set today Pt will go Sit to Stand: with max assist PT Goal: Sit to Stand - Progress: Goal set today Pt will Transfer Bed to Chair/Chair to Bed: with max assist PT Transfer Goal: Bed to Chair/Chair to Bed - Progress: Goal set today Pt will Ambulate: 1 - 15 feet;with max assist;with rolling walker PT Goal: Ambulate - Progress: Goal set today  Visit Information  Last PT Received On: 07/29/12 Assistance Needed: +2 PT/OT Co-Evaluation/Treatment: Yes    Subjective Data  Subjective: "You have to go!" Patient Stated Goal: Pt unable to state   Prior Functioning  Home Living Lives With: Spouse Type of Home: Assisted living Prior Function Comments: Per chart, pt was ambulatory "wanderer" PTA. Communication Communication:  (speech incoherent)    Cognition  Overall Cognitive Status: History of cognitive impairments - at baseline Arousal/Alertness: Awake/alert Orientation Level: Disoriented X4 Behavior During Session: Anxious Cognition - Other Comments: Pt did not follow command/cues or respond to questions.     Extremity/Trunk Assessment Right Upper Extremity Assessment RUE  ROM/Strength/Tone: [facility] for tasks assessed Left Upper Extremity Assessment LUE ROM/Strength/Tone: [facility] for tasks assessed Right Lower Extremity Assessment RLE ROM/Strength/Tone: Unable to fully assess;Due to impaired cognition;Due to pain Left Lower Extremity Assessment LLE ROM/Strength/Tone: Unable to fully assess;Due to impaired cognition;Due to pain   Balance Balance Balance Assessed: Yes Static Sitting Balance Static Sitting - Balance Support: Bilateral upper extremity supported;Feet supported;Feet unsupported Static Sitting - Level of Assistance: 4: Min assist Static Sitting - Comment/# of Minutes: Sat EOB 4-5 minutes with Min A. Pt leans to L side to unweight R hip due to pain.   End of Session PT - End of Session Equipment Utilized During Treatment: Gait belt Activity Tolerance: Patient limited by pain;Treatment limited secondary to agitation (Limited by cognition) Patient left: in chair;with call bell/phone within reach Nurse Communication: Mobility status;Need for lift equipment  GP     Rebeca Alert [facility] 07/29/2012, 11:31 AM [phone]

## 2012-07-29 NOTE — Progress Notes (Signed)
[facility] PROGRESS NOTE  Pamela Blackburn ZOX:[id number] DOB: [birth year] DOA: 07/26/2012 PCP: Kaleen Mask, MD  Assessment/Plan: Principal Problem:  *Closed intertrochanteric fracture of right hip Active Problems:  Leukocytosis  Hypercalcemia  Dehydration  Hyperlipidemia  Pernicious anemia  Dementia  Osteoporosis  HTN (hypertension)  CAD (coronary artery disease)  Anxiety  Hyperparathyroidism  Vitamin d deficiency  Fever  Right hip fracture, intertrochanteric -Likely secondary to mechanical fall. Patient with no witnessed syncopal episode. -For surgical hip repair today. -SCDs and Lovenox for DVT prophylaxis.  Dehydration IV fluids  Dementia -Patient has known dementia, she has nonsensical speech. -She was confused last night  Fever -Questionable etiology. Will repeat UA with cultures and sensitivities.  -Repeat urinalysis showed some pus cells, patient is afebrile now. I will wait for the culture before I start any antibiotic.  Leukocytosis Questionable etiology. Will panculture patient again secondary to problem #3. WBC is trending down. Follow for now.  Hypercalcemia/history of hyperparathyroidism Question bowel etiology. Patient's calcium levels have improved with hydration. PTH is pending. Ionized calcium is pending. Vitamin D. levels are decreased. Monitor for now.  Hypertension Stable. Continue beta blocker.  History of pernicious anemia Hemoglobin has dropped to 10.4 from 12.5 on admission. Likely dilutional effect. Will check an anemia panel. Will guaiac stools. Follow H&H.   Code Status: Full Family Communication: No family at bedside Disposition Plan: Skilled nursing facility when medically stable   Brief narrative: Pamela Blackburn is a 76 y.o. female with history of hypertension, hyperlipidemia, pernicious anemia, mild coronary artery disease with history of questionable mild non-Q-wave MI in the past, remote history of tobacco abuse,  dementia who is a nursing home resident at [facility] home who presented to the ED with right hip pain. Patient is demented and a such cannot give a history. Patient is only on the same she's terribly sore in her right hip. History was obtained from the ED physician's notes and from information sent from the nursing facility. Per nursing facility and ED notes it was reported that patient normally wanders around the facility and insisted. It was reported that the patient fell earlier on in the morning of admission which was unwitnessed by staff however it was noted that patient was able to get back up and walk to her room on her own. It was noted that staff stated that patient did not hit her head based on report from other residents who witnessed the fall. It was noted at the facility the patient was not moving around as usual which prompted them to call EMS. Upon exam at nursing facility it was noted that patient was guarding her right hip and on palpation she screamed and as such patient was brought to the ED. In the ED x-rays which were done did show any acute right hip intertrochanteric fracture.   Consultants:  Orthopedics: Dr. Ophelia Charter 07/26/2012  Procedures:  Chest x-ray 07/26/2012  X-ray of the right hip 07/26/2012  Antibiotics:  None  HPI/Subjective: Patient is pleasantly confused. Pulling at the mittens. With nonsensical speech.  Objective: Filed Vitals:   07/28/12 2144 07/28/12 2244 07/29/12 0109 07/29/12 0540  BP: 104/62 101/61 105/67 110/71  Pulse: 69 67 75 82  Temp: 97.6 F (36.4 C) 96.9 F (36.1 C) 96.7 F (35.9 C) 97.9 F (36.6 C)  TempSrc: Axillary Axillary Axillary Axillary  Resp: 12 12 12 12   SpO2: 100% 100% 100% 100%    Intake/Output Summary (Last 24 hours) at 07/29/12 1401 Last data filed at 07/29/12  0600  Gross per 24 hour  Intake 3057.5 ml  Output   1600 ml  Net 1457.5 ml   There were no vitals filed for this visit.  Exam:   General:   Patient is pleasantly confused in no distress  Cardiovascular: Regular rate rhythm no murmurs rubs or gallops  Respiratory: Clear to auscultation bilaterally in anterior lung fields  Abdomen: Soft nontender nondistended positive bowel sounds  Extremities: No clubbing cyanosis or edema. Right lower extremity is externally rotated.  Data Reviewed: Basic Metabolic Panel:  Lab 07/29/12 1191 07/28/12 0355 07/27/12 0443 07/26/12 1503 07/26/12 1043  NA 135 135 136 -- 135  K 4.1 3.6 3.5 -- 3.7  CL 102 101 102 -- 100  CO2 28 27 26  -- 25  GLUCOSE 136* 110* 103* -- 107*  BUN 6 5* 8 -- 9  CREATININE 0.59 0.57 0.60 -- 0.54  CALCIUM 9.7 9.9 10.2 11.0* 10.9*  MG -- -- -- 1.9 --  PHOS -- -- -- 2.5 --   Liver Function Tests:  Lab 07/26/12 1503  AST 18  ALT 11  ALKPHOS 139*  BILITOT 0.6  PROT 7.3  ALBUMIN 3.7   No results found for this basename: LIPASE:5,AMYLASE:5 in the last 168 hours No results found for this basename: AMMONIA:5 in the last 168 hours CBC:  Lab 07/29/12 0344 07/28/12 0355 07/27/12 0443 07/26/12 1043  WBC 8.1 7.1 6.8 14.6*  NEUTROABS -- -- -- 12.8*  HGB 9.4* 10.5* 10.4* 12.5  HCT 28.1* 31.6* 31.2* 37.1  MCV 86.7 86.6 86.9 87.1  PLT 227 236 245 316   Cardiac Enzymes: No results found for this basename: CKTOTAL:5,CKMB:5,CKMBINDEX:5,TROPONINI:5 in the last 168 hours BNP (last 3 results) No results found for this basename: PROBNP:3 in the last 8760 hours CBG: No results found for this basename: GLUCAP:5 in the last 168 hours  Recent Results (from the past 240 hour(s))  URINE CULTURE     Status: Normal   Collection Time   07/26/12 11:51 AM      Component Value Range Status Comment   Specimen Description URINE, CATHETERIZED   Final    Special Requests NONE   Final    Culture  Setup Time 07/27/2012 03:06   Final    Colony Count NO GROWTH   Final    Culture NO GROWTH   Final    Report Status 07/27/2012 FINAL   Final   CULTURE, BLOOD (ROUTINE X 2)     Status:  Normal (Preliminary result)   Collection Time   07/27/12  5:08 PM      Component Value Range Status Comment   Specimen Description BLOOD LEFT ARM   Final    Special Requests BOTTLES DRAWN AEROBIC ONLY 5CC   Final    Culture  Setup Time 07/27/2012 20:40   Final    Culture     Final    Value:        BLOOD CULTURE RECEIVED NO GROWTH TO DATE CULTURE WILL BE HELD FOR 5 DAYS BEFORE ISSUING A FINAL NEGATIVE REPORT   Report Status PENDING   Incomplete   CULTURE, BLOOD (ROUTINE X 2)     Status: Normal (Preliminary result)   Collection Time   07/27/12  5:10 PM      Component Value Range Status Comment   Specimen Description BLOOD LEFT WRIST   Final    Special Requests BOTTLES DRAWN AEROBIC ONLY 3CC   Final    Culture  Setup Time 07/27/2012 20:40  Final    Culture     Final    Value:        BLOOD CULTURE RECEIVED NO GROWTH TO DATE CULTURE WILL BE HELD FOR 5 DAYS BEFORE ISSUING A FINAL NEGATIVE REPORT   Report Status PENDING   Incomplete      Studies: Dg Chest 1 View  07/26/2012  *RADIOLOGY REPORT*  Clinical Data: Preoperative respiratory exam for right hip fracture.  CHEST - 1 VIEW  Comparison: 12/11/2009  Findings: No evidence of pulmonary infiltrate, edema or pleural effusion.  Heart size is normal.  No bony abnormalities identified.  IMPRESSION: No active disease in the chest.   Original Report Authenticated By: Reola Calkins, M.D.    Dg Hip Complete Right  07/26/2012  *RADIOLOGY REPORT*  Clinical Data: Fall with right hip pain.  RIGHT HIP - COMPLETE 2+ VIEW  Comparison: None.  Findings: Acute and comminuted intertrochanteric fracture of the proximal right femur shows angulation and displacement.  There is medial displacement of the lesser trochanter.  No evidence of dislocation.  The rest of the bony pelvis appears intact.  IMPRESSION: Acute intertrochanteric fracture of the right hip.   Original Report Authenticated By: Reola Calkins, M.D.     Scheduled Meds:    . antiseptic oral rinse   15 mL Mouth Rinse BID  .  ceFAZolin (ANCEF) IV  2 g Intravenous Q6H  . docusate sodium  100 mg Oral BID  . enoxaparin (LOVENOX) injection  40 mg Subcutaneous Q24H  . feeding supplement  237 mL Oral BID BM  . ferrous sulfate  325 mg Oral Q breakfast  . LORazepam  0.5 mg Oral Q12H  . metoprolol  50 mg Oral Daily  . potassium chloride  40 mEq Oral Once  . vitamin B-12  1,000 mcg Oral Daily  . DISCONTD: potassium chloride  40 mEq Oral Once   Continuous Infusions:    . sodium chloride 75 mL/hr at 07/28/12 0946  . dextrose 5 % and 0.45 % NaCl with KCl 20 mEq/L 75 mL/hr at 07/29/12 1207  . DISCONTD: lactated ringers      Principal Problem:  *Closed intertrochanteric fracture of right hip Active Problems:  Leukocytosis  Hypercalcemia  Dehydration  Hyperlipidemia  Pernicious anemia  Dementia  Osteoporosis  HTN (hypertension)  CAD (coronary artery disease)  Anxiety  Hyperparathyroidism  Vitamin d deficiency  Fever    Time spent: > 30 mins    [facility] A  [facility] Pager [phone]. If 8PM-8AM, please contact night-coverage at [email or web address], password [facility] 07/29/2012, 2:01 PM  LOS: 3 days

## 2012-07-29 NOTE — Progress Notes (Signed)
CSW is assisting with d/c planning. Met with pt's spouse on 9/4 to provide SNF bed offers. Spouse requesting to look into pt returning to ALF. [facility] contacted. Victorino Dike ( NSG ) stated that the ALF has readmitted pt's with hip fx/surgery and feels they can manage her ADL care. They can ( through [facility] ) provide PT services. The amount of  PT services is unclear and will depend on PT Eval / recommendations. Spouse would prefer pt to return to [facility] if they are able to provide all needed care. CSW will meet with spouse again following PT eval to review recommendations and continue with d/c planning.  Cori Razor LCSW [phone]

## 2012-07-29 NOTE — Evaluation (Signed)
Occupational Therapy Evaluation Patient Details Name: Pamela Blackburn MRN: [id number] DOB: June 19, 1936 Today's Date: 07/29/2012 Time: 4098-1191 OT Time Calculation (min): 18 min  OT Assessment / Plan / Recommendation Clinical Impression  Pt is a 76 yo female from an ALF who presents s/p R IM nail from fall. Pt is severely demented and unable to follow commands. Per last social work note, husband would like pt to return to ALF where they both reside. ALF said they can provide pt's ADL care. This is an unrealistic option. Pt is +2 A to mobilize and total care for al ADLs. Pt would require hosp bed, hoyer lift , w/c and 24/7 +2 A . Highly recommend snf. Pt is unable to participate in OT acutely 2* cognitive status.    OT Assessment  All further OT needs can be met in the next venue of care    Follow Up Recommendations  Skilled nursing facility    Barriers to Discharge      Equipment Recommendations  Defer to next venue    Recommendations for Other Services    Frequency       Precautions / Restrictions Precautions Precautions: Fall Restrictions Weight Bearing Restrictions: No   Pertinent Vitals/Pain Pt moaned and cried out throughout eval. Unable to rate pain.    ADL  ADL Comments: Mobilized pt to EOB. Pt was able to sit unsupported for several minutes. Pt total care for all ADLs due to decreased cognitive status.    OT Diagnosis: Generalized weakness;Cognitive deficits  OT Problem List: Decreased cognition OT Treatment Interventions:     OT Goals    Visit Information  Last OT Received On: 07/29/12 Assistance Needed: +2 PT/OT Co-Evaluation/Treatment: Yes    Subjective Data  Subjective: You have to go! Patient Stated Goal: Pt unable to state goal.   Prior Functioning  Vision/Perception  Home Living Lives With: Spouse;Other (Comment) (Pt resides at ALF.) Prior Function Comments: Unable to provide info due to pt's severe confusion. Communication Communication: Other  (comment) (word salad)      Cognition  Overall Cognitive Status: No family/caregiver present to determine baseline cognitive functioning Arousal/Alertness: Awake/alert Orientation Level: Disoriented X4 Behavior During Session: Anxious Cognition - Other Comments: Pt unable to follow commands or safety cues.    Extremity/Trunk Assessment Right Upper Extremity Assessment RUE ROM/Strength/Tone: WFL for tasks assessed Left Upper Extremity Assessment LUE ROM/Strength/Tone: WFL for tasks assessed   Mobility  Shoulder Instructions  Bed Mobility Bed Mobility: Supine to Sit Supine to Sit: 1: +2 Total assist;HOB elevated Supine to Sit: Patient Percentage: 0% Details for Bed Mobility Assistance: Pt limited by pain and fear. Transfers Transfers: Sit to Stand;Stand to Sit Sit to Stand: 1: +2 Total assist;With upper extremity assist;From bed Sit to Stand: Patient Percentage: 0% Stand to Sit: 1: +2 Total assist;With upper extremity assist;To chair/3-in-1 Stand to Sit: Patient Percentage: 0% Details for Transfer Assistance: Pt unable to follow safety cues for SPT.       Exercise     Balance     End of Session OT - End of Session Equipment Utilized During Treatment: Gait belt Activity Tolerance: Patient limited by pain Patient left: in chair;with call bell/phone within reach Nurse Communication: Need for lift equipment  GO     Pamela Blackburn A OTR/L [phone] 07/29/2012, 11:14 AM

## 2012-07-29 NOTE — Care Management Note (Signed)
    Page 1 of 2   07/29/2012     1:07:38 PM   CARE MANAGEMENT NOTE 07/29/2012  Patient:  Pamela Blackburn, Pamela Blackburn   Account Number:  [id number]  Date Initiated:  07/29/2012  Documentation initiated by:  Colleen Can  Subjective/Objective Assessment:   dx rt hip fracture sustained after falling. Pt is from ALF. Plans are for SNF rehab     Action/Plan:   SNF or ALF   Anticipated DC Date:  07/29/2012   Anticipated DC Plan:  SKILLED NURSING FACILITY  In-house referral  Clinical Social Worker      DC Planning Services  CM consult      [facility] Choice  NA   Choice offered to / List presented to:  NA   DME arranged  NA      DME agency  NA     HH arranged  NA      HH agency  NA   Status of service:  Completed, signed off Medicare Important Message given?  NA - LOS <3 / Initial given by admissions (If response is "NO", the following Medicare IM given date fields will be blank) Date Medicare IM given:   Date Additional Medicare IM given:    Discharge Disposition:    Per UR Regulation:  Reviewed for med. necessity/level of care/duration of stay  If discussed at Long Length of Stay Meetings, dates discussed:    Comments:

## 2012-07-30 DIAGNOSIS — F0390 Unspecified dementia without behavioral disturbance: Secondary | ICD-10-CM

## 2012-07-30 LAB — BASIC METABOLIC PANEL
CO2: 26 mEq/L (ref 19–32)
Calcium: 10.3 mg/dL (ref 8.4–10.5)
Chloride: 99 mEq/L (ref 96–112)
Sodium: 133 mEq/L — ABNORMAL LOW (ref 135–145)

## 2012-07-30 LAB — CBC
MCH: 28.9 pg (ref 26.0–34.0)
Platelets: 295 10*3/uL (ref 150–400)
RBC: 3.53 MIL/uL — ABNORMAL LOW (ref 3.87–5.11)
WBC: 8.4 10*3/uL (ref 4.0–10.5)

## 2012-07-30 MED ORDER — METOPROLOL TARTRATE 1 MG/ML IV SOLN
5.0000 mg | Freq: Two times a day (BID) | INTRAVENOUS | Status: DC
  Administered 2012-07-30 – 2012-07-31 (×2): 5 mg via INTRAVENOUS
  Filled 2012-07-30 (×3): qty 5

## 2012-07-30 MED ORDER — METOPROLOL TARTRATE 1 MG/ML IV SOLN
5.0000 mg | Freq: Two times a day (BID) | INTRAVENOUS | Status: DC
  Filled 2012-07-30: qty 5

## 2012-07-30 MED ORDER — DEXTROSE 5 % IV SOLN
1.0000 g | INTRAVENOUS | Status: DC
  Administered 2012-07-30: 1 g via INTRAVENOUS
  Filled 2012-07-30 (×2): qty 10

## 2012-07-30 MED ORDER — AMOXICILLIN 500 MG PO CAPS
500.0000 mg | ORAL_CAPSULE | Freq: Three times a day (TID) | ORAL | Status: DC
  Administered 2012-07-30: 500 mg via ORAL
  Filled 2012-07-30 (×4): qty 1

## 2012-07-30 MED ORDER — BLISTEX EX OINT
TOPICAL_OINTMENT | CUTANEOUS | Status: AC
  Administered 2012-07-30: 1
  Filled 2012-07-30: qty 10

## 2012-07-30 NOTE — Progress Notes (Signed)
CSW consulted by RN case manager, Amy, about patient not progressing. RN requested a swallow evaluation, which stated patient has moderate oropharyngeal dysphasia and recommends a SNF.   CSW contacted the patient's husband due to patient's altered mental status to discuss d/c to SNF. The patient's spouse [phone]) stated he wants to keep her d/c plan to [facility], he believes "she will do better in a place she knows." Spouse is aware of medical team's recommendation for a SNF. CSW provided spouse with supportive counseling on patient's current medical condition.  Lia Foyer, LCSWA [facility] Clinical Social Worker Contact #: [phone] (PRN)

## 2012-07-30 NOTE — Progress Notes (Signed)
Physical Therapy Treatment Patient Details Name: Pamela Blackburn MRN: [id number] DOB: 1936/01/06 Today's Date: 07/30/2012 Time: 2725-3664 PT Time Calculation (min): 16 min  PT Assessment / Plan / Recommendation Comments on Treatment Session  Very little improvement with mobility this session. Continues to require +2 total assist for all mobility. Continue to recommend SNF (note plan is for ALF however). If pt discharges to ALF will need HHPT and 24 hour supervision/assist. Will follow on trial basis during stay.     Follow Up Recommendations  Skilled nursing facility;Supervision/Assistance - 24 hour    Barriers to Discharge        Equipment Recommendations   (If pt discharges to ALF, will likely need wheelchair, hospital bed, lift equipment, ambulance transport)    Recommendations for Other Services    Frequency Min 3X/week   Plan Discharge plan remains appropriate    Precautions / Restrictions Precautions Precautions: Fall Restrictions Weight Bearing Restrictions: No RLE Weight Bearing: Weight bearing as tolerated   Pertinent Vitals/Pain R hip with movement    Mobility  Bed Mobility Bed Mobility: Supine to Sit Supine to Sit: 1: +2 Total assist Supine to Sit: Patient Percentage: 10% Details for Bed Mobility Assistance: Assist for trunk to upright and bil LEs off bed. Utilized bedpad for scooting, positioning. Pt moved bil LE slightly. Still somewhat resistant to movement.  Transfers Transfers: Sit to Stand;Stand to Sit;Squat Pivot Transfers Sit to Stand: 1: +2 Total assist Sit to Stand: Patient Percentage: 10% Stand to Sit: 1: +2 Total assist Stand to Sit: Patient Percentage: 10% Squat Pivot Transfers: 1: +2 Total assist Squat Pivot Transfers: Patient Percentage: 10% Details for Transfer Assistance: Attempted sit stand from bed with RW. Pt continues to push RW away and weight shifted posteriorly with feet anterior (oustide of BOS). Squat-pivot to recliner to L side. Pt  beared a very small amount of weight on R LE but remains unable to weightshift or step.     Exercises     PT Diagnosis:    PT Problem List:   PT Treatment Interventions:     PT Goals Acute Rehab PT Goals Pt will go Supine/Side to Sit: with max assist PT Goal: Supine/Side to Sit - Progress: Progressing toward goal Pt will go Sit to Stand: with max assist PT Goal: Sit to Stand - Progress: Progressing toward goal Pt will Transfer Bed to Chair/Chair to Bed: with max assist PT Transfer Goal: Bed to Chair/Chair to Bed - Progress: Progressing toward goal  Visit Information  Last PT Received On: 07/30/12 Assistance Needed: +2    Subjective Data  Subjective: "Please don't push me!" Patient Stated Goal: Unable to state   Cognition  Overall Cognitive Status: History of cognitive impairments - at baseline Arousal/Alertness: Awake/alert Orientation Level: Disoriented X4 Behavior During Session: Agitated Cognition - Other Comments: Does not follow command/cues    Balance  Balance Balance Assessed: Yes Static Sitting Balance Static Sitting - Balance Support: Bilateral upper extremity supported;Feet supported Static Sitting - Level of Assistance: 5: Stand by assistance Static Sitting - Comment/# of Minutes: EOB 2-3 minutes. Weight still somewhat shifted to L side. Some improvement in sitting posture today.   End of Session PT - End of Session Equipment Utilized During Treatment: Gait belt Activity Tolerance: Patient limited by pain (Limited by cognition) Patient left: in chair Nurse Communication: Need for lift equipment   GP     Rebeca Alert [facility] 07/30/2012, 10:44 AM [phone]

## 2012-07-30 NOTE — Plan of Care (Signed)
Problem: Phase II Progression Outcomes Goal: Tolerating diet Outcome: Not Progressing Swallow evaluation today

## 2012-07-30 NOTE — Clinical Documentation Improvement (Signed)
Abnormal Labs Clarification  THIS DOCUMENT IS NOT A PERMANENT PART OF THE MEDICAL RECORD  TO RESPOND TO THE THIS QUERY, FOLLOW THE INSTRUCTIONS BELOW:  1. If needed, update documentation for the patient's encounter via the notes activity.  2. Access this query again and click edit on the [employer].  3. After updating, or not, click F2 to complete all highlighted (required) fields concerning your review. Select "additional documentation in the medical record" OR "no additional documentation provided".  4. Click Sign note button.  5. The deficiency will fall out of your InBasket *Please let us know if you are not able to complete this workflow by phone or e-mail (listed below).  Please update your documentation within the medical record to reflect your response to this query.                                                                                   07/30/12  Dear Dr. Arthor Captain, Pamela Blackburn  In a better effort to capture your patient's severity of illness, reflect appropriate length of stay and utilization of resources, a review of the medical record has revealed the following indicators.    Based on your clinical judgment, please clarify and document in a progress note and/or discharge summary the clinical condition associated with the following supporting information:  In responding to this query please exercise your independent judgment.  The fact that a query is asked, does not imply that any particular answer is desired or expected.  Abnormal findings (laboratory, x-ray, pathologic, and other diagnostic results) are not coded and reported unless the physician indicates their clinical significance.   The medical record reflects the following clinical findings, please clarify the diagnostic and/or clinical significance:      According to lab pt's H/H= 9.4/28.1 on 07/29/12.    Please clarify the underlying diagnosis responsible for the abnormal H/H and document in pn and d/c  summary.   Possible Conditions:   Expected Acute blood loss anemia  __________Other Condition __________Cannot Clinically Determine    Possible Clinical Conditions?                                   Supporting Information:  Risk Factors:                                  Closed intertrochanteric fracture of right hip, hypercalcemia, pernicious anemia, Hyper-parathyrodism.  Lab Test:   Normal Values : Component     Latest Ref Rng 07/28/2012 07/29/2012 07/30/2012            Hemoglobin     12.0 - 15.0 g/dL 45.4 (L) 9.4 (L) 09.8 (L)  HCT     36.0 - 46.0 % 31.6 (L) 28.1 (L) 30.3 (L)     Treatment:  Reviewed:  no additional documentation provided ljh   Thank You,  Enis Slipper  RN, BSN, MSN/Inf, CCDS Clinical Documentation Specialist [facility] Dept Pager: [phone] / E-mail: [email or web address]  Health Information Management [place]

## 2012-07-30 NOTE — Evaluation (Signed)
Clinical/Bedside Swallow Evaluation Patient Details  Name: Pamela Blackburn MRN: [id number] Date of Birth: 05-23-1936  Today's Date: 07/30/2012 Time: 9563-8756 SLP Time Calculation (min): 17 min  Past Medical History:  Past Medical History  Diagnosis Date  . Anemia   . Osteoporosis   . Coronary artery disease   . Dementia   . Hypertension   . Leukocytosis 07/26/2012  . Hyperlipidemia 07/26/2012  . Pernicious anemia 07/26/2012  . HTN (hypertension) 07/26/2012  . CAD (coronary artery disease) 07/26/2012    Cath 12/26/05: LAD 10-15% mid stenosis and distally, LCIRC small and patent, RCA 15-20% mid stenosis. EF 45%, LV with mild anterolateral and mid inferior wall hypokinesia  Myoview : 12/13/2009: No evidence of inducible reversible ischemia EF 77%  . Anxiety 07/26/2012  . History of tobacco use 07/26/2012  . Hyperparathyroidism 07/26/2012  . Vitamin d deficiency 07/26/2012   Past Surgical History:  Past Surgical History  Procedure Date  . Tonsillectomy    HPI:  Pamela Blackburn is a 76 y.o. female with history of hypertension, hyperlipidemia, pernicious anemia, mild coronary artery disease with history of questionable mild non-Q-wave MI in the past, remote history of tobacco abuse, dementia who is a nursing home resident at [facility] retirement home who presented to the ED with right hip pain.  S/p surgical repair of right hip fracture on 9/5. This am, RN noted difficulty swallowing including throat clearing post swallow and grimacing in pain with the swallow. Evaluation by SLP ordered.    Assessment / Plan / Recommendation Clinical Impression  Patient presents with clinical indicators of a moderate oropharyngeal dysphagia with potential weakness vs structural abnormalities as evidenced by multiple audible, consecutive swallows per bite/sip, and both immediate and delayed wet vocal quality, throat clearing, and coughing with all consistencies tested suggestive of pharyngeal residuals which are penetrates  and/or aspirated. Poor positioning with neck hyperextended as well as baseline dementia which has likely been exacerbated by acute hosptilization and surgical intervention likely impacting overall safety with pos. Question some degree of dysphagia pre-op, now worsened acutely. Recommend NPO with completion of MBS to objectively evaluate function on 9/7.     Aspiration Risk  Severe    Diet Recommendation NPO   Medication Administration: Via alternative means    Other  Recommendations Recommended Consults: MBS Oral Care Recommendations: Oral care QID   Follow Up Recommendations  Skilled Nursing facility       Pertinent Vitals/Pain n/a     Swallow Study    General HPI: Pamela Blackburn is a 76 y.o. female with history of hypertension, hyperlipidemia, pernicious anemia, mild coronary artery disease with history of questionable mild non-Q-wave MI in the past, remote history of tobacco abuse, dementia who is a nursing home resident at [facility] retirement home who presented to the ED with right hip pain.  S/p surgical repair of right hip fracture on 9/5. This am, RN noted difficulty swallowing including throat clearing post swallow and grimacing in pain with the swallow. Evaluation by SLP ordered.  Type of Study: Bedside swallow evaluation Previous Swallow Assessment: none noted in chart Diet Prior to this Study: Regular;Thin liquids Temperature Spikes Noted: No Respiratory Status: Room air History of Recent Intubation: Yes Length of Intubations (days):  (for surgery only) Behavior/Cognition: Lethargic;Confused;Distractible;Requires cueing;Decreased sustained attention;Doesn't follow directions Oral Cavity - Dentition: Poor condition Self-Feeding Abilities: Total assist Patient Positioning: Upright in chair (neck hyperextended, repositioned by SLP) Baseline Vocal Quality: Clear Volitional Cough: Cognitively unable to elicit Volitional Swallow: Unable to elicit (  spontaneous dry swallow  noted; audible with facial grimacing)    Oral/Motor/Sensory Function Overall Oral Motor/Sensory Function: Other (comment) (unable to formally assess due to mentation, no assymetry)   Ice Chips Ice chips: Impaired Presentation: Spoon Pharyngeal Phase Impairments: Suspected delayed Swallow;Decreased hyoid-laryngeal movement;Wet Vocal Quality;Throat Clearing - Immediate;Throat Clearing - Delayed;Cough - Delayed;Other (comments) (audible swallow; ? weakness or structural abnormality)   Thin Liquid Thin Liquid: Impaired Presentation: Cup Oral Phase Impairments: Reduced labial seal Pharyngeal  Phase Impairments: Suspected delayed Swallow;Multiple swallows;Wet Vocal Quality;Throat Clearing - Immediate;Throat Clearing - Delayed;Cough - Immediate    Nectar Thick Nectar Thick Liquid: Not tested   Honey Thick Honey Thick Liquid: Not tested   Puree Puree: Impaired Presentation: Spoon Pharyngeal Phase Impairments: Suspected delayed Swallow;Multiple swallows;Throat Clearing - Delayed   Solid   GO   Pamela Slovacek MA, CCC-SLP [phone]  Solid: Not tested       Pamela Blackburn 07/30/2012,2:45 PM

## 2012-07-30 NOTE — Progress Notes (Addendum)
Subjective: 2 Days Post-Op Procedure(s) (LRB): INTRAMEDULLARY (IM) NAIL INTERTROCHANTERIC (Right) Patient reports pain as mild.    Objective: Vital signs in last 24 hours: Temp:  [98.1 F (36.7 C)-100.6 F (38.1 C)] 98.3 F (36.8 C) (09/06 0509) Pulse Rate:  [76-85] 85  (09/06 0509) Resp:  [12-14] 14  (09/06 0509) BP: (113-148)/(66-86) 113/72 mmHg (09/06 0509) SpO2:  [94 %-98 %] 97 % (09/06 0509)  Intake/Output from previous day: 09/05 0701 - 09/06 0700 In: 120 [P.O.:120] Out: -  Intake/Output this shift:     Basename 07/30/12 0400 07/29/12 0344 07/28/12 0355  HGB 10.2* 9.4* 10.5*    Basename 07/30/12 0400 07/29/12 0344  WBC 8.4 8.1  RBC 3.53* 3.24*  HCT 30.3* 28.1*  PLT 295 227    Basename 07/30/12 0400 07/29/12 0344  NA 133* 135  K 4.0 4.1  CL 99 102  CO2 26 28  BUN 5* 6  CREATININE 0.51 0.59  GLUCOSE 126* 136*  CALCIUM 10.3 9.7   No results found for this basename: LABPT:2,INR:2 in the last 72 hours  Neurologically intact  Assessment/Plan: 2 Days Post-Op Procedure(s) (LRB): INTRAMEDULLARY (IM) NAIL INTERTROCHANTERIC (Right) Up with therapy  Hgb stable . Dressing dry.   Pamela Blackburn C 07/30/2012, 6:50 AM  She  Is FWB.    With her confusion not a good candidate for coumadin for 4 wks. . Likely would stop lovenox when she is discharged and put her on one aspirin 325 po bid.   She will need to see me in the office in 2 wks.   SNF when medically ready.

## 2012-07-30 NOTE — Progress Notes (Signed)
[facility] PROGRESS NOTE  Pamela Blackburn ZOX:[id number] DOB: [birth year] DOA: 07/26/2012 PCP: Kaleen Mask, MD  Assessment/Plan: Principal Problem:  *Closed intertrochanteric fracture of right hip Active Problems:  Leukocytosis  Hypercalcemia  Dehydration  Hyperlipidemia  Pernicious anemia  Dementia  Osteoporosis  HTN (hypertension)  CAD (coronary artery disease)  Anxiety  Hyperparathyroidism  Vitamin d deficiency  Fever   Right hip fracture, intertrochanteric -Likely secondary to mechanical fall. Patient with no witnessed syncopal episode. -Had ORIF with intramedullary nail. -Per RN she showed some signs of discomfort when she was swallowing, this is likely secondary to recent intubation/surgery, SLP to see -Orthopedic recommendation for DVT prophylaxis is Lovenox till discharge, then ASA 325 mg twice a day  Dehydration IV fluids  Dementia -Patient has known dementia, she has nonsensical speech. -She was confused last night  UTI -Questionable etiology. Will repeat UA with cultures and sensitivities.  -Repeat urinalysis showed some pus cells, and culture showed GBS, started on amoxicillin.  Leukocytosis Questionable etiology. Will panculture patient again secondary to problem #3. WBC is trending down. Follow for now.  Hypercalcemia/history of hyperparathyroidism Question bowel etiology. Patient's calcium levels have improved with hydration. PTH is pending. Ionized calcium is pending. Vitamin D. levels are decreased. Monitor for now.  Hypertension Stable. Continue beta blocker.  History of pernicious anemia Hemoglobin has dropped to 10.4 from 12.5 on admission. Likely dilutional effect. Will check an anemia panel. Will guaiac stools. Follow H&H.   Code Status: Full Family Communication: No family at bedside Disposition Plan: Skilled nursing facility when medically stable   Brief narrative: Pamela Blackburn is a 76 y.o. female with history of  hypertension, hyperlipidemia, pernicious anemia, mild coronary artery disease with history of questionable mild non-Q-wave MI in the past, remote history of tobacco abuse, dementia who is a nursing home resident at [facility] retirement home who presented to the ED with right hip pain. Patient is demented and a such cannot give a history. Patient is only on the same she's terribly sore in her right hip. History was obtained from the ED physician's notes and from information sent from the nursing facility. Per nursing facility and ED notes it was reported that patient normally wanders around the facility and insisted. It was reported that the patient fell earlier on in the morning of admission which was unwitnessed by staff however it was noted that patient was able to get back up and walk to her room on her own. It was noted that staff stated that patient did not hit her head based on report from other residents who witnessed the fall. It was noted at the facility the patient was not moving around as usual which prompted them to call EMS. Upon exam at nursing facility it was noted that patient was guarding her right hip and on palpation she screamed and as such patient was brought to the ED. In the ED x-rays which were done did show any acute right hip intertrochanteric fracture.   Consultants:  Orthopedics: Dr. Ophelia Charter 07/26/2012  Procedures:  Chest x-ray 07/26/2012  X-ray of the right hip 07/26/2012  Antibiotics:  None  HPI/Subjective: Patient is pleasantly confused. Pulling at the mittens. With nonsensical speech.  Objective: Filed Vitals:   07/29/12 1434 07/29/12 2120 07/30/12 0509 07/30/12 0922  BP: 132/86 148/66 113/72 107/71  Pulse: 76 78 85 94  Temp: 98.1 F (36.7 C) 100.6 F (38.1 C) 98.3 F (36.8 C)   TempSrc: Oral Axillary Oral   Resp: 12 12 14  SpO2: 98% 94% 97%     Intake/Output Summary (Last 24 hours) at 07/30/12 1402 Last data filed at 07/30/12 0900  Gross per 24  hour  Intake 2047.5 ml  Output      0 ml  Net 2047.5 ml   There were no vitals filed for this visit.  Exam:   General:  Patient is pleasantly confused in no distress  Cardiovascular: Regular rate rhythm no murmurs rubs or gallops  Respiratory: Clear to auscultation bilaterally in anterior lung fields  Abdomen: Soft nontender nondistended positive bowel sounds  Extremities: No clubbing cyanosis or edema. Right lower extremity is externally rotated.  Data Reviewed: Basic Metabolic Panel:  Lab 07/30/12 2956 07/29/12 0344 07/28/12 0355 07/27/12 0443 07/26/12 1503  NA 133* 135 135 136 --  K 4.0 4.1 3.6 3.5 --  CL 99 102 101 102 --  CO2 26 28 27 26  --  GLUCOSE 126* 136* 110* 103* --  BUN 5* 6 5* 8 --  CREATININE 0.51 0.59 0.57 0.60 --  CALCIUM 10.3 9.7 9.9 10.2 11.0*  MG -- -- -- -- 1.9  PHOS -- -- -- -- 2.5   Liver Function Tests:  Lab 07/26/12 1503  AST 18  ALT 11  ALKPHOS 139*  BILITOT 0.6  PROT 7.3  ALBUMIN 3.7   No results found for this basename: LIPASE:5,AMYLASE:5 in the last 168 hours No results found for this basename: AMMONIA:5 in the last 168 hours CBC:  Lab 07/30/12 0400 07/29/12 0344 07/28/12 0355 07/27/12 0443 07/26/12 1043  WBC 8.4 8.1 7.1 6.8 14.6*  NEUTROABS -- -- -- -- 12.8*  HGB 10.2* 9.4* 10.5* 10.4* 12.5  HCT 30.3* 28.1* 31.6* 31.2* 37.1  MCV 85.8 86.7 86.6 86.9 87.1  PLT 295 227 236 245 316   Cardiac Enzymes: No results found for this basename: CKTOTAL:5,CKMB:5,CKMBINDEX:5,TROPONINI:5 in the last 168 hours BNP (last 3 results) No results found for this basename: PROBNP:3 in the last 8760 hours CBG: No results found for this basename: GLUCAP:5 in the last 168 hours  Recent Results (from the past 240 hour(s))  URINE CULTURE     Status: Normal   Collection Time   07/26/12 11:51 AM      Component Value Range Status Comment   Specimen Description URINE, CATHETERIZED   Final    Special Requests NONE   Final    Culture  Setup Time  07/27/2012 03:06   Final    Colony Count NO GROWTH   Final    Culture NO GROWTH   Final    Report Status 07/27/2012 FINAL   Final   URINE CULTURE     Status: Normal   Collection Time   07/27/12  5:00 PM      Component Value Range Status Comment   Specimen Description URINE, CLEAN CATCH   Final    Special Requests NONE   Final    Culture  Setup Time 07/28/2012 00:54   Final    Colony Count 90,000 COLONIES/ML   Final    Culture     Final    Value: GROUP B STREP(S.AGALACTIAE)ISOLATED     Note: TESTING AGAINST S. AGALACTIAE NOT ROUTINELY PERFORMED DUE TO PREDICTABILITY OF AMP/PEN/VAN SUSCEPTIBILITY.   Report Status 07/29/2012 FINAL   Final   CULTURE, BLOOD (ROUTINE X 2)     Status: Normal (Preliminary result)   Collection Time   07/27/12  5:08 PM      Component Value Range Status Comment   Specimen Description BLOOD  LEFT ARM   Final    Special Requests BOTTLES DRAWN AEROBIC ONLY 5CC   Final    Culture  Setup Time 07/27/2012 20:40   Final    Culture     Final    Value:        BLOOD CULTURE RECEIVED NO GROWTH TO DATE CULTURE WILL BE HELD FOR 5 DAYS BEFORE ISSUING A FINAL NEGATIVE REPORT   Report Status PENDING   Incomplete   CULTURE, BLOOD (ROUTINE X 2)     Status: Normal (Preliminary result)   Collection Time   07/27/12  5:10 PM      Component Value Range Status Comment   Specimen Description BLOOD LEFT WRIST   Final    Special Requests BOTTLES DRAWN AEROBIC ONLY 3CC   Final    Culture  Setup Time 07/27/2012 20:40   Final    Culture     Final    Value:        BLOOD CULTURE RECEIVED NO GROWTH TO DATE CULTURE WILL BE HELD FOR 5 DAYS BEFORE ISSUING A FINAL NEGATIVE REPORT   Report Status PENDING   Incomplete      Studies: Dg Chest 1 View  07/26/2012  *RADIOLOGY REPORT*  Clinical Data: Preoperative respiratory exam for right hip fracture.  CHEST - 1 VIEW  Comparison: 12/11/2009  Findings: No evidence of pulmonary infiltrate, edema or pleural effusion.  Heart size is normal.  No bony  abnormalities identified.  IMPRESSION: No active disease in the chest.   Original Report Authenticated By: Reola Calkins, M.D.    Dg Hip Complete Right  07/26/2012  *RADIOLOGY REPORT*  Clinical Data: Fall with right hip pain.  RIGHT HIP - COMPLETE 2+ VIEW  Comparison: None.  Findings: Acute and comminuted intertrochanteric fracture of the proximal right femur shows angulation and displacement.  There is medial displacement of the lesser trochanter.  No evidence of dislocation.  The rest of the bony pelvis appears intact.  IMPRESSION: Acute intertrochanteric fracture of the right hip.   Original Report Authenticated By: Reola Calkins, M.D.     Scheduled Meds:    . amoxicillin  500 mg Oral Q8H  . antiseptic oral rinse  15 mL Mouth Rinse BID  . docusate sodium  100 mg Oral BID  . enoxaparin (LOVENOX) injection  40 mg Subcutaneous Q24H  . feeding supplement  237 mL Oral BID BM  . ferrous sulfate  325 mg Oral Q breakfast  . lip balm      . LORazepam  0.5 mg Oral Q12H  . metoprolol  50 mg Oral Daily  . vitamin B-12  1,000 mcg Oral Daily   Continuous Infusions:    . dextrose 5 % and 0.45 % NaCl with KCl 20 mEq/L 75 mL/hr at 07/30/12 5621    Principal Problem:  *Closed intertrochanteric fracture of right hip Active Problems:  Leukocytosis  Hypercalcemia  Dehydration  Hyperlipidemia  Pernicious anemia  Dementia  Osteoporosis  HTN (hypertension)  CAD (coronary artery disease)  Anxiety  Hyperparathyroidism  Vitamin d deficiency  Fever    Time spent: > 30 mins    [facility]  [facility] Pager [phone]. If 8PM-8AM, please contact night-coverage at [email or web address], password [facility] 07/30/2012, 2:02 PM  LOS: 4 days

## 2012-07-31 ENCOUNTER — Inpatient Hospital Stay (HOSPITAL_COMMUNITY): Payer: Medicare Other

## 2012-07-31 DIAGNOSIS — Z87891 Personal history of nicotine dependence: Secondary | ICD-10-CM

## 2012-07-31 LAB — VITAMIN D 1,25 DIHYDROXY
Vitamin D 1, 25 (OH)2 Total: 123 pg/mL — ABNORMAL HIGH (ref 18–72)
Vitamin D2 1, 25 (OH)2: <8 pg/mL

## 2012-07-31 LAB — CBC
MCV: 86.2 fL (ref 78.0–100.0)
Platelets: 337 10*3/uL (ref 150–400)
RBC: 3.33 MIL/uL — ABNORMAL LOW (ref 3.87–5.11)
WBC: 7.5 10*3/uL (ref 4.0–10.5)

## 2012-07-31 MED ORDER — AMOXICILLIN 500 MG PO CAPS
500.0000 mg | ORAL_CAPSULE | Freq: Three times a day (TID) | ORAL | Status: DC
  Administered 2012-07-31 – 2012-08-02 (×7): 500 mg via ORAL
  Filled 2012-07-31 (×9): qty 1

## 2012-07-31 MED ORDER — VITAMINS A & D EX OINT
TOPICAL_OINTMENT | CUTANEOUS | Status: AC
  Filled 2012-07-31: qty 5

## 2012-07-31 MED ORDER — METOPROLOL TARTRATE 50 MG PO TABS
50.0000 mg | ORAL_TABLET | Freq: Every day | ORAL | Status: DC
  Administered 2012-08-02: 50 mg via ORAL
  Filled 2012-07-31 (×3): qty 1

## 2012-07-31 NOTE — Procedures (Signed)
Objective Swallowing Evaluation: Modified Barium Swallowing Study  Patient Details  Name: Pamela Blackburn MRN: [id number] Date of Birth: 15-Mar-1936  Today's Date: 07/31/2012 Time: 0810-0840 SLP Time Calculation (min): 30 min  Past Medical History:  Past Medical History  Diagnosis Date  . Anemia   . Osteoporosis   . Coronary artery disease   . Dementia   . Hypertension   . Leukocytosis 07/26/2012  . Hyperlipidemia 07/26/2012  . Pernicious anemia 07/26/2012  . HTN (hypertension) 07/26/2012  . CAD (coronary artery disease) 07/26/2012    Cath 12/26/05: LAD 10-15% mid stenosis and distally, LCIRC small and patent, RCA 15-20% mid stenosis. EF 45%, LV with mild anterolateral and mid inferior wall hypokinesia  Myoview : 12/13/2009: No evidence of inducible reversible ischemia EF 77%  . Anxiety 07/26/2012  . History of tobacco use 07/26/2012  . Hyperparathyroidism 07/26/2012  . Vitamin d deficiency 07/26/2012   Past Surgical History:  Past Surgical History  Procedure Date  . Tonsillectomy    HPI:  Pamela Blackburn is a 76 y.o. female with history of hypertension, hyperlipidemia, pernicious anemia, mild coronary artery disease with history of questionable mild non-Q-wave MI in the past, remote history of tobacco abuse, dementia who is a nursing home resident at [facility] retirement home who presented to the ED with right hip pain.  S/p surgical repair of right hip fracture on 9/5. This am, RN noted difficulty swallowing including throat clearing post swallow and grimacing in pain with the swallow. Evaluation by SLP ordered.      Assessment / Plan / Recommendation Clinical Impression  Dysphagia Diagnosis: Mild oral phase dysphagia;Mild pharyngeal phase dysphagia Clinical impression: Patient presents with a mild oropharyngeal dysphagia with suspected improvement from initial bedside evaluation on 9/6. Patient presents with prolongued oral transit, primarily with soft solids due to poor dentition/AMS, and a  delayed swallow initiation, when combined with base of tongue weakness and decreased laryngeal closure, result in trace penetration and aspiration of thin liquids, and mild residuals post swallow.  Patient does respond to penetration/aspiration with throat clear which is effective to eventually clear the airway, however at times, response is significantly delayed and given fluctuations in mentation, doubtful that this will be a consistent means of preventing aspiration at this time. Suspect that this is an acute reversible dysphagia, likely attributed to post surgical changes in mentation and strength, which is likely to improve over time. Given baseline dementia, also cannot r/o some degree of pre-op dysphagia. Recommend beginning conservatively with a modified diet to decrease risk of aspiration acutely. SLP will f/u at bedside for potential advance.     Treatment Recommendation  Therapy as outlined in treatment plan below    Diet Recommendation Dysphagia 2 (Fine chop);Nectar-thick liquid   Liquid Administration via: Cup;No straw Medication Administration: Crushed with puree Supervision: Patient able to self feed;Full supervision/cueing for compensatory strategies Compensations: Slow rate;Small sips/bites Postural Changes and/or Swallow Maneuvers: Seated upright 90 degrees    Other  Recommendations Oral Care Recommendations: Oral care BID Other Recommendations: Order thickener from pharmacy;Prohibited food (jello, ice cream, thin soups);Remove water pitcher   Follow Up Recommendations  Skilled Nursing facility    Frequency and Duration min 3x week  2 weeks   Pertinent Vitals/Pain n/a    SLP Swallow Goals Patient will utilize recommended strategies during swallow to increase swallowing safety with: Maximal cueing Swallow Study Goal #2 - Progress: Not met   General HPI: Pamela Blackburn is a 76 y.o. female with history of hypertension,  hyperlipidemia, pernicious anemia, mild coronary  artery disease with history of questionable mild non-Q-wave MI in the past, remote history of tobacco abuse, dementia who is a nursing home resident at [facility] retirement home who presented to the ED with right hip pain.  S/p surgical repair of right hip fracture on 9/5. This am, RN noted difficulty swallowing including throat clearing post swallow and grimacing in pain with the swallow. Evaluation by SLP ordered.  Type of Study: Modified Barium Swallowing Study Reason for Referral: Objectively evaluate swallowing function Previous Swallow Assessment: Bedside swallow eval complete on 9/6 and indicated need for MBS to objectively evaluate swallow Diet Prior to this Study: NPO;IV Temperature Spikes Noted: No Respiratory Status: Room air History of Recent Intubation: Yes (for surgery only) Behavior/Cognition: Alert;Confused;Distractible;Doesn't follow directions;Requires cueing;Decreased sustained attention Oral Cavity - Dentition: Poor condition Oral Motor / Sensory Function: Within functional limits Self-Feeding Abilities: Able to feed self Patient Positioning: Upright in chair Baseline Vocal Quality: Clear Volitional Cough: Cognitively unable to elicit Volitional Swallow: Unable to elicit Anatomy: Within functional limits Pharyngeal Secretions: Not observed secondary MBS    Reason for Referral Objectively evaluate swallowing function      Pharyngeal Phase Pharyngeal Phase: Impaired   Cervical Esophageal Phase    GO   Ferdinand Lango MA, CCC-SLP [phone]  Cervical Esophageal Phase: [facility]    Pamela Blackburn 07/31/2012, 10:18 AM

## 2012-07-31 NOTE — Progress Notes (Signed)
[facility] PROGRESS NOTE  ANJANA CHEEK LKG:[id number] DOB: 01-10-1936 DOA: 07/26/2012 PCP: Kaleen Mask, MD  Assessment/Plan: Principal Problem:  *Closed intertrochanteric fracture of right hip Active Problems:  Leukocytosis  Hypercalcemia  Dehydration  Hyperlipidemia  Pernicious anemia  Dementia  Osteoporosis  HTN (hypertension)  CAD (coronary artery disease)  Anxiety  Hyperparathyroidism  Vitamin d deficiency  Fever   Right hip fracture, intertrochanteric -Likely secondary to mechanical fall. Patient with no witnessed syncopal episode. -Had ORIF with intramedullary nail. -Per RN she showed some signs of discomfort when she was swallowing, this is likely secondary to recent intubation/surgery, SLP to see -Orthopedic recommendation for DVT prophylaxis is Lovenox till discharge, then ASA 325 mg twice a day  Dehydration IV fluids  Dysphagia -Patient was coughing with food, SLP was consulted. -MBS was done, SLP recommended dysphagia 2 diet with nectar thick liquids.  Dementia -Patient has known dementia, she has nonsensical speech. -She was confused last night  UTI -Questionable etiology. Will repeat UA with cultures and sensitivities.  -Repeat urinalysis showed some pus cells, and culture showed GBS, started on amoxicillin.  Leukocytosis Questionable etiology. Will panculture patient again secondary to problem #3. WBC is trending down. Follow for now.  Hypercalcemia/history of hyperparathyroidism Question bowel etiology. Patient's calcium levels have improved with hydration. PTH is pending. Ionized calcium is pending. Vitamin D. levels are decreased. Monitor for now.  Hypertension Stable. Continue beta blocker.  History of pernicious anemia Hemoglobin has dropped to 10.4 from 12.5 on admission. Likely dilutional effect. Will check an anemia panel. Will guaiac stools. Follow H&H.   Code Status: Full Family Communication: No family at  bedside Disposition Plan: Skilled nursing facility when medically stable   Brief narrative: Pamela Blackburn is a 76 y.o. female with history of hypertension, hyperlipidemia, pernicious anemia, mild coronary artery disease with history of questionable mild non-Q-wave MI in the past, remote history of tobacco abuse, dementia who is a nursing home resident at [facility] retirement home who presented to the ED with right hip pain. Patient is demented and a such cannot give a history. Patient is only on the same she's terribly sore in her right hip. History was obtained from the ED physician's notes and from information sent from the nursing facility. Per nursing facility and ED notes it was reported that patient normally wanders around the facility and insisted. It was reported that the patient fell earlier on in the morning of admission which was unwitnessed by staff however it was noted that patient was able to get back up and walk to her room on her own. It was noted that staff stated that patient did not hit her head based on report from other residents who witnessed the fall. It was noted at the facility the patient was not moving around as usual which prompted them to call EMS. Upon exam at nursing facility it was noted that patient was guarding her right hip and on palpation she screamed and as such patient was brought to the ED. In the ED x-rays which were done did show any acute right hip intertrochanteric fracture.   Consultants:  Orthopedics: Dr. Ophelia Charter 07/26/2012  Procedures:  Chest x-ray 07/26/2012  X-ray of the right hip 07/26/2012  Antibiotics:  None  HPI/Subjective: Patient is pleasantly confused. Pulling at the mittens. With nonsensical speech.  Objective: Filed Vitals:   07/31/12 0357 07/31/12 0640 07/31/12 1200 07/31/12 1429  BP:  129/78  109/53  Pulse:  109  68  Temp:  98.5 F (  36.9 C)  98.7 F (37.1 C)  TempSrc:  Oral  Oral  Resp: 16 16 16 16   SpO2: 97% 95% 98% 98%     Intake/Output Summary (Last 24 hours) at 07/31/12 1539 Last data filed at 07/31/12 1348  Gross per 24 hour  Intake 1768.75 ml  Output      0 ml  Net 1768.75 ml   There were no vitals filed for this visit.  Exam:   General:  Patient is pleasantly confused in no distress  Cardiovascular: Regular rate rhythm no murmurs rubs or gallops  Respiratory: Clear to auscultation bilaterally in anterior lung fields  Abdomen: Soft nontender nondistended positive bowel sounds  Extremities: No clubbing cyanosis or edema. Right lower extremity is externally rotated.  Data Reviewed: Basic Metabolic Panel:  Lab 07/30/12 1610 07/29/12 0344 07/28/12 0355 07/27/12 0443 07/26/12 1503  NA 133* 135 135 136 --  K 4.0 4.1 3.6 3.5 --  CL 99 102 101 102 --  CO2 26 28 27 26  --  GLUCOSE 126* 136* 110* 103* --  BUN 5* 6 5* 8 --  CREATININE 0.51 0.59 0.57 0.60 --  CALCIUM 10.3 9.7 9.9 10.2 11.0*  MG -- -- -- -- 1.9  PHOS -- -- -- -- 2.5   Liver Function Tests:  Lab 07/26/12 1503  AST 18  ALT 11  ALKPHOS 139*  BILITOT 0.6  PROT 7.3  ALBUMIN 3.7   No results found for this basename: LIPASE:5,AMYLASE:5 in the last 168 hours No results found for this basename: AMMONIA:5 in the last 168 hours CBC:  Lab 07/31/12 0433 07/30/12 0400 07/29/12 0344 07/28/12 0355 07/27/12 0443 07/26/12 1043  WBC 7.5 8.4 8.1 7.1 6.8 --  NEUTROABS -- -- -- -- -- 12.8*  HGB 9.6* 10.2* 9.4* 10.5* 10.4* --  HCT 28.7* 30.3* 28.1* 31.6* 31.2* --  MCV 86.2 85.8 86.7 86.6 86.9 --  PLT 337 295 227 236 245 --   Cardiac Enzymes: No results found for this basename: CKTOTAL:5,CKMB:5,CKMBINDEX:5,TROPONINI:5 in the last 168 hours BNP (last 3 results) No results found for this basename: PROBNP:3 in the last 8760 hours CBG: No results found for this basename: GLUCAP:5 in the last 168 hours  Recent Results (from the past 240 hour(s))  URINE CULTURE     Status: Normal   Collection Time   07/26/12 11:51 AM      Component  Value Range Status Comment   Specimen Description URINE, CATHETERIZED   Final    Special Requests NONE   Final    Culture  Setup Time 07/27/2012 03:06   Final    Colony Count NO GROWTH   Final    Culture NO GROWTH   Final    Report Status 07/27/2012 FINAL   Final   URINE CULTURE     Status: Normal   Collection Time   07/27/12  5:00 PM      Component Value Range Status Comment   Specimen Description URINE, CLEAN CATCH   Final    Special Requests NONE   Final    Culture  Setup Time 07/28/2012 00:54   Final    Colony Count 90,000 COLONIES/ML   Final    Culture     Final    Value: GROUP B STREP(S.AGALACTIAE)ISOLATED     Note: TESTING AGAINST S. AGALACTIAE NOT ROUTINELY PERFORMED DUE TO PREDICTABILITY OF AMP/PEN/VAN SUSCEPTIBILITY.   Report Status 07/29/2012 FINAL   Final   CULTURE, BLOOD (ROUTINE X 2)     Status:  Normal (Preliminary result)   Collection Time   07/27/12  5:08 PM      Component Value Range Status Comment   Specimen Description BLOOD LEFT ARM   Final    Special Requests BOTTLES DRAWN AEROBIC ONLY 5CC   Final    Culture  Setup Time 07/27/2012 20:40   Final    Culture     Final    Value:        BLOOD CULTURE RECEIVED NO GROWTH TO DATE CULTURE WILL BE HELD FOR 5 DAYS BEFORE ISSUING A FINAL NEGATIVE REPORT   Report Status PENDING   Incomplete   CULTURE, BLOOD (ROUTINE X 2)     Status: Normal (Preliminary result)   Collection Time   07/27/12  5:10 PM      Component Value Range Status Comment   Specimen Description BLOOD LEFT WRIST   Final    Special Requests BOTTLES DRAWN AEROBIC ONLY 3CC   Final    Culture  Setup Time 07/27/2012 20:40   Final    Culture     Final    Value:        BLOOD CULTURE RECEIVED NO GROWTH TO DATE CULTURE WILL BE HELD FOR 5 DAYS BEFORE ISSUING A FINAL NEGATIVE REPORT   Report Status PENDING   Incomplete      Studies: Dg Chest 1 View  07/26/2012  *RADIOLOGY REPORT*  Clinical Data: Preoperative respiratory exam for right hip fracture.  CHEST - 1 VIEW   Comparison: 12/11/2009  Findings: No evidence of pulmonary infiltrate, edema or pleural effusion.  Heart size is normal.  No bony abnormalities identified.  IMPRESSION: No active disease in the chest.   Original Report Authenticated By: Reola Calkins, M.D.    Dg Hip Complete Right  07/26/2012  *RADIOLOGY REPORT*  Clinical Data: Fall with right hip pain.  RIGHT HIP - COMPLETE 2+ VIEW  Comparison: None.  Findings: Acute and comminuted intertrochanteric fracture of the proximal right femur shows angulation and displacement.  There is medial displacement of the lesser trochanter.  No evidence of dislocation.  The rest of the bony pelvis appears intact.  IMPRESSION: Acute intertrochanteric fracture of the right hip.   Original Report Authenticated By: Reola Calkins, M.D.     Scheduled Meds:    . antiseptic oral rinse  15 mL Mouth Rinse BID  . enoxaparin (LOVENOX) injection  40 mg Subcutaneous Q24H  . ferrous sulfate  325 mg Oral Q breakfast  . LORazepam  0.5 mg Oral Q12H  . metoprolol tartrate  50 mg Oral Daily  . vitamin A & D      . vitamin B-12  1,000 mcg Oral Daily  . DISCONTD: cefTRIAXone (ROCEPHIN)  IV  1 g Intravenous Q24H  . DISCONTD: metoprolol  5 mg Intravenous Q12H  . DISCONTD: metoprolol  5 mg Intravenous Q12H   Continuous Infusions:    . DISCONTD: dextrose 5 % and 0.45 % NaCl with KCl 20 mEq/L 75 mL/hr at 07/31/12 1610    Principal Problem:  *Closed intertrochanteric fracture of right hip Active Problems:  Leukocytosis  Hypercalcemia  Dehydration  Hyperlipidemia  Pernicious anemia  Dementia  Osteoporosis  HTN (hypertension)  CAD (coronary artery disease)  Anxiety  Hyperparathyroidism  Vitamin d deficiency  Fever    Time spent: > 30 mins    [facility]  [facility] Pager [phone]. If 8PM-8AM, please contact night-coverage at [email or web address], password [facility] 07/31/2012, 3:39 PM  LOS: 5 days

## 2012-08-01 DIAGNOSIS — E785 Hyperlipidemia, unspecified: Secondary | ICD-10-CM

## 2012-08-01 LAB — BASIC METABOLIC PANEL
Calcium: 10.7 mg/dL — ABNORMAL HIGH (ref 8.4–10.5)
GFR calc Af Amer: >90 mL/min (ref >90)
GFR calc non Af Amer: 90 mL/min — ABNORMAL LOW (ref >90)
Potassium: 4.1 mEq/L (ref 3.5–5.1)
Sodium: 133 mEq/L — ABNORMAL LOW (ref 135–145)

## 2012-08-01 LAB — CBC
Hemoglobin: 10.1 g/dL — ABNORMAL LOW (ref 12.0–15.0)
Platelets: 369 10*3/uL (ref 150–400)
RBC: 3.5 MIL/uL — ABNORMAL LOW (ref 3.87–5.11)
WBC: 7.6 10*3/uL (ref 4.0–10.5)

## 2012-08-01 NOTE — Progress Notes (Signed)
Pt pulling staples from incision site...reinforced with steri strips, gause, and coban dressing.  Pt hands kerlex'd and safety mitts applied.

## 2012-08-01 NOTE — Progress Notes (Signed)
Subjective: 4 Days Post-Op Procedure(s) (LRB): INTRAMEDULLARY (IM) NAIL INTERTROCHANTERIC (Right) Patient reports pain as mild  Objective: Vital signs in last 24 hours: Temp:  [98.3 F (36.8 C)-98.9 F (37.2 C)] 98.9 F (37.2 C) (09/08 0647) Pulse Rate:  [68-113] 99  (09/08 0647) Resp:  [12-16] 12  (09/08 0647) BP: (102-109)/(53-69) 106/67 mmHg (09/08 0647) SpO2:  [93 %-98 %] 93 % (09/08 0647)  Intake/Output from previous day: 09/07 0701 - 09/08 0700 In: 160 [P.O.:160] Out: -  Intake/Output this shift:     Basename 08/01/12 0420 07/31/12 0433 07/30/12 0400  HGB 10.1* 9.6* 10.2*    Basename 08/01/12 0420 07/31/12 0433  WBC 7.6 7.5  RBC 3.50* 3.33*  HCT 30.0* 28.7*  PLT 369 337    Basename 08/01/12 0420 07/30/12 0400  NA 133* 133*  K 4.1 4.0  CL 99 99  CO2 26 26  BUN 13 5*  CREATININE 0.57 0.51  GLUCOSE 122* 126*  CALCIUM 10.7* 10.3   No results found for this basename: LABPT:2,INR:2 in the last 72 hours  Neurologically intact  Assessment/Plan: 4 Days Post-Op Procedure(s) (LRB): INTRAMEDULLARY (IM) NAIL INTERTROCHANTERIC (Right) Discharge to SNF  Patient still confused and pulled out 4 staples from her incision.  Steri-strips applied by RN.   Jermine Bibbee C 08/01/2012, 8:20 AM

## 2012-08-01 NOTE — Progress Notes (Signed)
Pt pulled out 4-5 staples...reinforced with steri strips, ABD pads, and paper tape.

## 2012-08-01 NOTE — Progress Notes (Signed)
[facility] PROGRESS NOTE  Pamela Blackburn AVW:[id number] DOB: 15-Jul-1936 DOA: 07/26/2012 PCP: Kaleen Mask, MD  Assessment/Plan: Principal Problem:  *Closed intertrochanteric fracture of right hip Active Problems:  Leukocytosis  Hypercalcemia  Dehydration  Hyperlipidemia  Pernicious anemia  Dementia  Osteoporosis  HTN (hypertension)  CAD (coronary artery disease)  Anxiety  Hyperparathyroidism  Vitamin d deficiency  Fever   Right hip fracture, intertrochanteric -Likely secondary to mechanical fall. Patient with no witnessed syncopal episode. -Had ORIF with intramedullary nail. -Per RN she showed some signs of discomfort when she was swallowing, this is likely secondary to recent intubation/surgery, SLP to see -Orthopedic recommendation for DVT prophylaxis is Lovenox till discharge, then ASA 325 mg twice a day  Dehydration IV fluids  Dysphagia -Patient was coughing with food, SLP was consulted. -MBS was done, SLP recommended dysphagia 2 diet with nectar thick liquids.  Dementia -Patient has known dementia, she has nonsensical speech. -She was confused and disoriented continuously throughout his hospital is  UTI -Repeat urinalysis showed some pus cells, and culture showed GBS, started on amoxicillin.  Leukocytosis Questionable etiology. Will panculture patient again secondary to problem #3. WBC is trending down. Follow for now.  Hypercalcemia/history of hyperparathyroidism Question bowel etiology. Patient's calcium levels have improved with hydration. PTH is pending. Ionized calcium is pending. Vitamin D. levels are decreased. Monitor for now.  Hypertension Stable. Continue beta blocker.  History of pernicious anemia Hemoglobin has dropped to 10.4 from 12.5 on admission. Likely dilutional effect. Will check an anemia panel. Will guaiac stools. Follow H&H.   Code Status: Full Family Communication: No family at bedside Disposition Plan: Her ALF, said  they will be a will to take her back tomorrow. They said they can provide SNF level of care for her.   Brief narrative: Pamela Blackburn is a 76 y.o. female with history of hypertension, hyperlipidemia, pernicious anemia, mild coronary artery disease with history of questionable mild non-Q-wave MI in the past, remote history of tobacco abuse, dementia who is a nursing home resident at [facility] retirement home who presented to the ED with right hip pain. Patient is demented and a such cannot give a history. Patient is only on the same she's terribly sore in her right hip. History was obtained from the ED physician's notes and from information sent from the nursing facility. Per nursing facility and ED notes it was reported that patient normally wanders around the facility and insisted. It was reported that the patient fell earlier on in the morning of admission which was unwitnessed by staff however it was noted that patient was able to get back up and walk to her room on her own. It was noted that staff stated that patient did not hit her head based on report from other residents who witnessed the fall. It was noted at the facility the patient was not moving around as usual which prompted them to call EMS. Upon exam at nursing facility it was noted that patient was guarding her right hip and on palpation she screamed and as such patient was brought to the ED. In the ED x-rays which were done did show any acute right hip intertrochanteric fracture.   Consultants:  Orthopedics: Dr. Ophelia Charter 07/26/2012  Procedures:  Chest x-ray 07/26/2012  X-ray of the right hip 07/26/2012  Antibiotics:  None  HPI/Subjective: Patient is pleasantly confused. Pulling at the mittens. With nonsensical speech.  Objective: Filed Vitals:   07/31/12 2038 08/01/12 0647 08/01/12 0800 08/01/12 1116  BP: 102/69 106/67  Pulse: 113 99    Temp: 98.3 F (36.8 C) 98.9 F (37.2 C)    TempSrc: Axillary Axillary    Resp: 15  12 12 12   SpO2: 98% 93% 94% 96%    Intake/Output Summary (Last 24 hours) at 08/01/12 1236 Last data filed at 07/31/12 2039  Gross per 24 hour  Intake    160 ml  Output      0 ml  Net    160 ml   There were no vitals filed for this visit.  Exam:   General:  Patient is pleasantly confused in no distress  Cardiovascular: Regular rate rhythm no murmurs rubs or gallops  Respiratory: Clear to auscultation bilaterally in anterior lung fields  Abdomen: Soft nontender nondistended positive bowel sounds  Extremities: No clubbing cyanosis or edema. Right lower extremity is externally rotated.  Data Reviewed: Basic Metabolic Panel:  Lab 08/01/12 0981 07/30/12 0400 07/29/12 0344 07/28/12 0355 07/27/12 0443 07/26/12 1503  NA 133* 133* 135 135 136 --  K 4.1 4.0 4.1 3.6 3.5 --  CL 99 99 102 101 102 --  CO2 26 26 28 27 26  --  GLUCOSE 122* 126* 136* 110* 103* --  BUN 13 5* 6 5* 8 --  CREATININE 0.57 0.51 0.59 0.57 0.60 --  CALCIUM 10.7* 10.3 9.7 9.9 10.2 --  MG -- -- -- -- -- 1.9  PHOS -- -- -- -- -- 2.5   Liver Function Tests:  Lab 07/26/12 1503  AST 18  ALT 11  ALKPHOS 139*  BILITOT 0.6  PROT 7.3  ALBUMIN 3.7   No results found for this basename: LIPASE:5,AMYLASE:5 in the last 168 hours No results found for this basename: AMMONIA:5 in the last 168 hours CBC:  Lab 08/01/12 0420 07/31/12 0433 07/30/12 0400 07/29/12 0344 07/28/12 0355 07/26/12 1043  WBC 7.6 7.5 8.4 8.1 7.1 --  NEUTROABS -- -- -- -- -- 12.8*  HGB 10.1* 9.6* 10.2* 9.4* 10.5* --  HCT 30.0* 28.7* 30.3* 28.1* 31.6* --  MCV 85.7 86.2 85.8 86.7 86.6 --  PLT 369 337 295 227 236 --   Cardiac Enzymes: No results found for this basename: CKTOTAL:5,CKMB:5,CKMBINDEX:5,TROPONINI:5 in the last 168 hours BNP (last 3 results) No results found for this basename: PROBNP:3 in the last 8760 hours CBG: No results found for this basename: GLUCAP:5 in the last 168 hours  Recent Results (from the past 240 hour(s))  URINE  CULTURE     Status: Normal   Collection Time   07/26/12 11:51 AM      Component Value Range Status Comment   Specimen Description URINE, CATHETERIZED   Final    Special Requests NONE   Final    Culture  Setup Time 07/27/2012 03:06   Final    Colony Count NO GROWTH   Final    Culture NO GROWTH   Final    Report Status 07/27/2012 FINAL   Final   URINE CULTURE     Status: Normal   Collection Time   07/27/12  5:00 PM      Component Value Range Status Comment   Specimen Description URINE, CLEAN CATCH   Final    Special Requests NONE   Final    Culture  Setup Time 07/28/2012 00:54   Final    Colony Count 90,000 COLONIES/ML   Final    Culture     Final    Value: GROUP B STREP(S.AGALACTIAE)ISOLATED     Note: TESTING AGAINST S. AGALACTIAE NOT ROUTINELY  PERFORMED DUE TO PREDICTABILITY OF AMP/PEN/VAN SUSCEPTIBILITY.   Report Status 07/29/2012 FINAL   Final   CULTURE, BLOOD (ROUTINE X 2)     Status: Normal (Preliminary result)   Collection Time   07/27/12  5:08 PM      Component Value Range Status Comment   Specimen Description BLOOD LEFT ARM   Final    Special Requests BOTTLES DRAWN AEROBIC ONLY 5CC   Final    Culture  Setup Time 07/27/2012 20:40   Final    Culture     Final    Value:        BLOOD CULTURE RECEIVED NO GROWTH TO DATE CULTURE WILL BE HELD FOR 5 DAYS BEFORE ISSUING A FINAL NEGATIVE REPORT   Report Status PENDING   Incomplete   CULTURE, BLOOD (ROUTINE X 2)     Status: Normal (Preliminary result)   Collection Time   07/27/12  5:10 PM      Component Value Range Status Comment   Specimen Description BLOOD LEFT WRIST   Final    Special Requests BOTTLES DRAWN AEROBIC ONLY 3CC   Final    Culture  Setup Time 07/27/2012 20:40   Final    Culture     Final    Value:        BLOOD CULTURE RECEIVED NO GROWTH TO DATE CULTURE WILL BE HELD FOR 5 DAYS BEFORE ISSUING A FINAL NEGATIVE REPORT   Report Status PENDING   Incomplete      Studies: Dg Chest 1 View  07/26/2012  *RADIOLOGY REPORT*   Clinical Data: Preoperative respiratory exam for right hip fracture.  CHEST - 1 VIEW  Comparison: 12/11/2009  Findings: No evidence of pulmonary infiltrate, edema or pleural effusion.  Heart size is normal.  No bony abnormalities identified.  IMPRESSION: No active disease in the chest.   Original Report Authenticated By: Reola Calkins, M.D.    Dg Hip Complete Right  07/26/2012  *RADIOLOGY REPORT*  Clinical Data: Fall with right hip pain.  RIGHT HIP - COMPLETE 2+ VIEW  Comparison: None.  Findings: Acute and comminuted intertrochanteric fracture of the proximal right femur shows angulation and displacement.  There is medial displacement of the lesser trochanter.  No evidence of dislocation.  The rest of the bony pelvis appears intact.  IMPRESSION: Acute intertrochanteric fracture of the right hip.   Original Report Authenticated By: Reola Calkins, M.D.     Scheduled Meds:    . amoxicillin  500 mg Oral Q8H  . antiseptic oral rinse  15 mL Mouth Rinse BID  . enoxaparin (LOVENOX) injection  40 mg Subcutaneous Q24H  . ferrous sulfate  325 mg Oral Q breakfast  . LORazepam  0.5 mg Oral Q12H  . metoprolol tartrate  50 mg Oral Daily  . vitamin A & D      . vitamin B-12  1,000 mcg Oral Daily  . DISCONTD: cefTRIAXone (ROCEPHIN)  IV  1 g Intravenous Q24H  . DISCONTD: metoprolol  5 mg Intravenous Q12H   Continuous Infusions:    . DISCONTD: dextrose 5 % and 0.45 % NaCl with KCl 20 mEq/L 75 mL/hr at 07/31/12 3244    Principal Problem:  *Closed intertrochanteric fracture of right hip Active Problems:  Leukocytosis  Hypercalcemia  Dehydration  Hyperlipidemia  Pernicious anemia  Dementia  Osteoporosis  HTN (hypertension)  CAD (coronary artery disease)  Anxiety  Hyperparathyroidism  Vitamin d deficiency  Fever    Time spent: > 30 mins  [facility] A  [facility] Pager [phone]. If 8PM-8AM, please contact night-coverage at [email or web address], password [facility] 08/01/2012, 12:36 PM   LOS: 6 days

## 2012-08-02 LAB — CULTURE, BLOOD (ROUTINE X 2): Culture: NO GROWTH

## 2012-08-02 MED ORDER — SENNOSIDES-DOCUSATE SODIUM 8.6-50 MG PO TABS
2.0000 | ORAL_TABLET | Freq: Every evening | ORAL | Status: DC | PRN
  Filled 2012-08-02: qty 2

## 2012-08-02 MED ORDER — PNEUMOCOCCAL VAC POLYVALENT 25 MCG/0.5ML IJ INJ: 0.5000 mL | INJECTION | INTRAMUSCULAR | Status: DC

## 2012-08-02 MED ORDER — PNEUMOCOCCAL VAC POLYVALENT 25 MCG/0.5ML IJ INJ
0.5000 mL | INJECTION | Freq: Once | INTRAMUSCULAR | Status: AC
  Administered 2012-08-02: 0.5 mL via INTRAMUSCULAR
  Filled 2012-08-02: qty 0.5

## 2012-08-02 MED ORDER — OXYCODONE-ACETAMINOPHEN 5-325 MG PO TABS: 1.0000 | ORAL_TABLET | ORAL | Status: AC | PRN

## 2012-08-02 MED ORDER — BISACODYL 10 MG RE SUPP
10.0000 mg | Freq: Once | RECTAL | Status: AC
  Administered 2012-08-02: 10 mg via RECTAL
  Filled 2012-08-02 (×2): qty 1

## 2012-08-02 MED ORDER — ASPIRIN EC 325 MG PO TBEC: 325.0000 mg | DELAYED_RELEASE_TABLET | Freq: Two times a day (BID) | ORAL | Status: AC

## 2012-08-02 NOTE — Discharge Summary (Signed)
Physician Discharge Summary  DANAIJA ESKRIDGE ZOX:[id number] DOB: Dec 10, 1935 DOA: 07/26/2012  PCP: Kaleen Mask, MD  Admit date: 07/26/2012 Discharge date: 08/02/2012  Recommendations for Outpatient Follow-up:  1. Followup with physical therapy and occupational therapy as well as SLP  Discharge Diagnoses:  Principal Problem:  *Closed intertrochanteric fracture of right hip Active Problems:  Leukocytosis  Hypercalcemia  Dehydration  Hyperlipidemia  Pernicious anemia  Dementia  Osteoporosis  HTN (hypertension)  CAD (coronary artery disease)  Anxiety  Hyperparathyroidism  Vitamin d deficiency  Fever   Discharge Condition: Stable  Diet recommendation: Dysphagia 2 with nectar thick liquids   History of present illness:  Pamela Blackburn is a 76 y.o. female with history of hypertension, hyperlipidemia, pernicious anemia, mild coronary artery disease with history of questionable mild non-Q-wave MI in the past, remote history of tobacco abuse, dementia who is a nursing home resident at [facility] retirement home who presented to the ED with right hip pain. Patient is demented and a such cannot give a history. Patient is only on the same she's terribly sore in her right hip. History was obtained from the ED physician's notes and from information sent from the nursing facility. Per nursing facility and ED notes it was reported that patient normally wanders around the facility and insisted. It was reported that the patient fell earlier on in the morning of admission which was unwitnessed by staff however it was noted that patient was able to get back up and walk to her room on her own. It was noted that staff stated that patient did not hit her head based on report from other residents who witnessed the fall. It was noted at the facility the patient was not moving around as which prompted them to call EMS. Upon exam at nursing facility it was noted that patient was guarding her right hip and on  palpation she screamed and as such patient was brought to the ED. In the ED x-rays which were done did show any acute right hip intertrochanteric fracture. ED physician spoke with Dr. Ophelia Charter of orthopedics will consult on the patient. We were called to admit the patient   Hospital Course:    1. Right hip fracture, intertrochanteric: Patient did have mechanical fall, patient has no weakness syncopal episodes while she is in the assisted living facility. Dr. Ophelia Charter from orthopedics was consulted and open reduction internal fixation with intramedullary nail was done on 07/29/2012. Afterwards pain was controlled with Tylenol and narcotics, DVT prophylaxis in the hospital with achieved with Lovenox, orthopedics recommended to discharge patient on aspirin 325 twice a day for at least to 2 weeks for DVT prophylaxis as outpatient and followup with them in 2 weeks.  2. Dementia: Patient has known dementia, she always has nonsensical speech, she was confused and disoriented continuously throughout the hospital stay.  3. UTI: Patient developed low-grade fever was in the hospital, initial urinalysis showed a UTI, but repeat urinalysis showed some pus cells and the culture grew GBS, patient was started on amoxicillin she received that for 3 days was in the hospital, no antibiotics prescribed as outpatient.  4. Dysphagia: Nursing staff noticed that patient coughing while she is eating, speech alignment pathology was consulted for swallowing evaluation. Modified barium swallow was done and SLP recommended dysphagia 2 with nectar thick liquids. Patient should be followed by SLP as outpatient for dysphagia management.  5. Hypertension: This is stable chronic condition, patient is on by mouth blocker and this was continued.  6. History  of pernicious anemia: Hemoglobin dropped from 12.5-10.4 on admission, this was likely dilutional effect of IV fluid plus the perioperative blood loss. Hemoglobin stable around 10 at  discharge.   Procedures:  Intramedullary long hip screw with proximal and distal interlock  Consultations:  Dr. Annell Greening of orthopedics  Discharge Exam: Filed Vitals:   08/01/12 1517 08/01/12 2134 08/02/12 0649 08/02/12 0800  BP:  107/72 128/63   Pulse:  104 109   Temp:  98.2 F (36.8 C) 99.2 F (37.3 C)   TempSrc:  Axillary Oral   Resp: 16 14 16 17   SpO2: 100% 94% 99%    General: Awake but confused and disoriented secondary to her dementia, not in any acute distress. HEENT: anicteric sclera, pupils reactive to light and accommodation, EOMI CVS: S1-S2 clear, no murmur rubs or gallops Chest: clear to auscultation bilaterally, no wheezing, rales or rhonchi Abdomen: soft nontender, nondistended, normal bowel sounds, no organomegaly Extremities: no cyanosis, clubbing or edema noted bilaterally Neuro: Cranial nerves II-XII intact, no focal neurological deficits   Discharge Instructions  Discharge Orders    Future Orders Please Complete By Expires   Diet - low sodium heart healthy      Increase activity slowly        Medication List  As of 08/02/2012 11:24 AM   TAKE these medications         alendronate 70 MG tablet   Commonly known as: FOSAMAX   Take 70 mg by mouth every 7 (seven) days. Take with a full glass of water on an empty stomach.      aspirin EC 325 MG tablet   Take 1 tablet (325 mg total) by mouth 2 (two) times daily.      dextromethorphan-guaiFENesin 10-100 MG/5ML liquid   Commonly known as: ROBITUSSIN-DM   Take 5 mLs by mouth every 8 (eight) hours as needed. Cough and congestion      ferrous sulfate 325 (65 FE) MG tablet   Take 325 mg by mouth daily with breakfast.      LORazepam 0.5 MG tablet   Commonly known as: ATIVAN   Take 0.5 mg by mouth every 12 (twelve) hours.      metoprolol 50 MG tablet   Commonly known as: LOPRESSOR   Take 50 mg by mouth daily.      oxyCODONE-acetaminophen 5-325 MG per tablet   Commonly known as: PERCOCET/ROXICET    Take 1 tablet by mouth every 4 (four) hours as needed for pain.      TYLENOL EX ST ARTHRITIS PAIN 500 MG tablet   Generic drug: acetaminophen   Take 1,000 mg by mouth every 6 (six) hours as needed. pain      vitamin B-12 1000 MCG tablet   Commonly known as: CYANOCOBALAMIN   Take 1,000 mcg by mouth daily.           Follow-up Information    Follow up with Kaleen Mask, MD in 1 week.   Contact information:   [address] [place] [address] [phone]       Follow up with Eldred Manges, MD in 2 weeks.   Contact information:   [facility] [address] [place] [address] [phone]           The results of significant diagnostics from this hospitalization (including imaging, microbiology, ancillary and laboratory) are listed below for reference.    Significant Diagnostic Studies: Dg Chest 1 View  07/26/2012  *RADIOLOGY REPORT*  Clinical Data: Preoperative respiratory exam for right hip fracture.  CHEST - 1 VIEW  Comparison: 12/11/2009  Findings: No evidence of pulmonary infiltrate, edema or pleural effusion.  Heart size is normal.  No bony abnormalities identified.  IMPRESSION: No active disease in the chest.   Original Report Authenticated By: Reola Calkins, M.D.    Dg Hip Complete Right  07/26/2012  *RADIOLOGY REPORT*  Clinical Data: Fall with right hip pain.  RIGHT HIP - COMPLETE 2+ VIEW  Comparison: None.  Findings: Acute and comminuted intertrochanteric fracture of the proximal right femur shows angulation and displacement.  There is medial displacement of the lesser trochanter.  No evidence of dislocation.  The rest of the bony pelvis appears intact.  IMPRESSION: Acute intertrochanteric fracture of the right hip.   Original Report Authenticated By: Reola Calkins, M.D.    Dg Femur Right  07/28/2012  *RADIOLOGY REPORT*  Clinical Data: ORIF right hip  RIGHT FEMUR - 2 VIEW  Comparison:  07/26/2012  Findings: Intertrochanteric fracture on the right has been fixed with a threaded screw and intramedullary rod.  C-arm images document satisfactory hardware position.  IMPRESSION: Satisfactory fixation of intertrochanteric fracture.   Original Report Authenticated By: Camelia Phenes, M.D.    Dg Pelvis Portable  07/28/2012  *RADIOLOGY REPORT*  Clinical Data: Postop right femur fracture fixation.  PORTABLE PELVIS  Comparison: Radiographs 07/26/2012.  Findings: Patient is status post open reduction and internal fixation of the intertrochanteric and subtrochanteric femur fracture with an intramedullary nail and dynamic screw.  Hardware appears well positioned.  The major fracture fragments demonstrate near anatomic reduction.  The intramedullary nail is secured by a distal interlocking screw.  IMPRESSION: Near anatomic reduction of proximal femur fracture status post ORIF.   Original Report Authenticated By: Gerrianne Scale, M.D.    Dg Chest Port 1 View  07/27/2012  *RADIOLOGY REPORT*  Clinical Data: Fever.  History of hypertension and dementia.  PORTABLE CHEST - 1 VIEW  Comparison: 07/26/2012 and 12/11/2009.  Findings: 1704 hours.  There is better positioning on the current examination. The heart size and mediastinal contours are normal. The lungs are clear. There is no pleural effusion or pneumothorax. No acute osseous findings are identified.  IMPRESSION: No active cardiopulmonary process.   Original Report Authenticated By: Gerrianne Scale, M.D.    Dg Swallowing Func-no Report  07/31/2012  CLINICAL DATA: Dysphagia   FLUOROSCOPY FOR SWALLOWING FUNCTION STUDY:  Fluoroscopy was provided for swallowing function study, which was  administered by a speech pathologist.  Final results and recommendations  from this study are contained within the speech pathology report.     Dg C-arm 1-60 Min-no Report  07/28/2012  CLINICAL DATA: orif right hip   C-ARM 1-60 MINUTES  Fluoroscopy was utilized by the  requesting physician.  No radiographic  interpretation.      Microbiology: Recent Results (from the past 240 hour(s))  URINE CULTURE     Status: Normal   Collection Time   07/26/12 11:51 AM      Component Value Range Status Comment   Specimen Description URINE, CATHETERIZED   Final    Special Requests NONE   Final    Culture  Setup Time 07/27/2012 03:06   Final    Colony Count NO GROWTH   Final    Culture NO GROWTH   Final    Report Status 07/27/2012 FINAL   Final   URINE CULTURE     Status: Normal   Collection Time  07/27/12  5:00 PM      Component Value Range Status Comment   Specimen Description URINE, CLEAN CATCH   Final    Special Requests NONE   Final    Culture  Setup Time 07/28/2012 00:54   Final    Colony Count 90,000 COLONIES/ML   Final    Culture     Final    Value: GROUP B STREP(S.AGALACTIAE)ISOLATED     Note: TESTING AGAINST S. AGALACTIAE NOT ROUTINELY PERFORMED DUE TO PREDICTABILITY OF AMP/PEN/VAN SUSCEPTIBILITY.   Report Status 07/29/2012 FINAL   Final   CULTURE, BLOOD (ROUTINE X 2)     Status: Normal   Collection Time   07/27/12  5:08 PM      Component Value Range Status Comment   Specimen Description BLOOD LEFT ARM   Final    Special Requests BOTTLES DRAWN AEROBIC ONLY 5CC   Final    Culture  Setup Time 07/27/2012 20:40   Final    Culture NO GROWTH 5 DAYS   Final    Report Status 08/02/2012 FINAL   Final   CULTURE, BLOOD (ROUTINE X 2)     Status: Normal   Collection Time   07/27/12  5:10 PM      Component Value Range Status Comment   Specimen Description BLOOD LEFT WRIST   Final    Special Requests BOTTLES DRAWN AEROBIC ONLY 3CC   Final    Culture  Setup Time 07/27/2012 20:40   Final    Culture NO GROWTH 5 DAYS   Final    Report Status 08/02/2012 FINAL   Final      Labs: Basic Metabolic Panel:  Lab 08/01/12 9562 07/30/12 0400 07/29/12 0344 07/28/12 0355 07/27/12 0443 07/26/12 1503  NA 133* 133* 135 135 136 --  K 4.1 4.0 4.1 3.6 3.5 --  CL 99 99 102 101  102 --  CO2 26 26 28 27 26  --  GLUCOSE 122* 126* 136* 110* 103* --  BUN 13 5* 6 5* 8 --  CREATININE 0.57 0.51 0.59 0.57 0.60 --  CALCIUM 10.7* 10.3 9.7 9.9 10.2 --  MG -- -- -- -- -- 1.9  PHOS -- -- -- -- -- 2.5   Liver Function Tests:  Lab 07/26/12 1503  AST 18  ALT 11  ALKPHOS 139*  BILITOT 0.6  PROT 7.3  ALBUMIN 3.7   No results found for this basename: LIPASE:5,AMYLASE:5 in the last 168 hours No results found for this basename: AMMONIA:5 in the last 168 hours CBC:  Lab 08/01/12 0420 07/31/12 0433 07/30/12 0400 07/29/12 0344 07/28/12 0355  WBC 7.6 7.5 8.4 8.1 7.1  NEUTROABS -- -- -- -- --  HGB 10.1* 9.6* 10.2* 9.4* 10.5*  HCT 30.0* 28.7* 30.3* 28.1* 31.6*  MCV 85.7 86.2 85.8 86.7 86.6  PLT 369 337 295 227 236   Cardiac Enzymes: No results found for this basename: CKTOTAL:5,CKMB:5,CKMBINDEX:5,TROPONINI:5 in the last 168 hours BNP: BNP (last 3 results) No results found for this basename: PROBNP:3 in the last 8760 hours CBG: No results found for this basename: GLUCAP:5 in the last 168 hours  Time coordinating discharge: 40 minutes  Signed:  Joselin Crandell A  [facility] 08/02/2012, 11:24 AM

## 2012-08-02 NOTE — Progress Notes (Signed)
Physical Therapy Treatment Patient Details Name: Pamela Blackburn MRN: [id number] DOB: 18-May-1936 Today's Date: 08/02/2012 Time: 4098-1191 PT Time Calculation (min): 19 min  PT Assessment / Plan / Recommendation Comments on Treatment Session  continues with very limited gains; requires +2 total assist for all mobility    Follow Up Recommendations  Skilled nursing facility;Supervision/Assistance - 24 hour    Barriers to Discharge        Equipment Recommendations  Defer to next venue    Recommendations for Other Services    Frequency Min 3X/week   Plan Discharge plan remains appropriate    Precautions / Restrictions Precautions Precautions: Fall Restrictions Weight Bearing Restrictions: No RLE Weight Bearing: Weight bearing as tolerated   Pertinent Vitals/Pain     Mobility  Bed Mobility Bed Mobility: Sit to Supine Sit to Supine: 1: +2 Total assist;HOB flat Sit to Supine: Patient Percentage: 0% Details for Bed Mobility Assistance: assist for trunk and bil LEs Transfers Transfers: Sit to Stand;Stand to Sit;Squat Pivot Transfers Sit to Stand: 1: +2 Total assist Sit to Stand: Patient Percentage: 10% Stand to Sit: 1: +2 Total assist Stand to Sit: Patient Percentage: 10% Squat Pivot Transfers: 1: +2 Total assist Squat Pivot Transfers: Patient Percentage: 10% Ambulation/Gait Ambulation/Gait Assistance: Not tested (comment) (pt unable)    Exercises General Exercises - Lower Extremity Ankle Circles/Pumps: AAROM;Both;10 reps   PT Diagnosis:    PT Problem List:   PT Treatment Interventions:     PT Goals Acute Rehab PT Goals PT Goal Formulation: Patient unable to participate in goal setting Time For Goal Achievement: 08/12/12 Potential to Achieve Goals: Fair Pt will go Supine/Side to Sit: with max assist Pt will go Sit to Supine/Side: with max assist PT Goal: Sit to Supine/Side - Progress: Progressing toward goal Pt will go Sit to Stand: with max assist PT Goal: Sit to  Stand - Progress: Progressing toward goal Pt will Transfer Bed to Chair/Chair to Bed: with max assist PT Transfer Goal: Bed to Chair/Chair to Bed - Progress: Progressing toward goal  Visit Information  Last PT Received On: 08/02/12 Assistance Needed: +2    Subjective Data  Subjective: Good morning! Patient Stated Goal: Unable to state   Cognition  Overall Cognitive Status: History of cognitive impairments - at baseline Arousal/Alertness: Awake/alert Orientation Level: Disoriented X4 Behavior During Session: Restless Cognition - Other Comments: Does not follow command/cues    Balance  Balance Balance Assessed: Yes Static Standing Balance Static Standing - Balance Support: Bilateral upper extremity supported Static Standing - Level of Assistance: 1: +2 Total assist;Patient percentage (comment) (pt=15%; pos terior LOB, unable to correct)  End of Session PT - End of Session Equipment Utilized During Treatment: Gait belt Activity Tolerance: Patient limited by pain Patient left: in bed;with call bell/phone within reach;with bed alarm set Nurse Communication: Mobility status   GP     [facility] 08/02/2012, 11:30 AM

## 2012-08-02 NOTE — Progress Notes (Signed)
No BM documented since admission.  [name] NP notified.  New order received for dulcolax suppository pt currently asleep.  Will administer later this am. Pamela Blackburn 4:02 AM

## 2012-08-03 NOTE — Progress Notes (Signed)
Pt returned to [facility] via [facility] on 08/02/12. Spouse was informed of d/c and he was in agreement with plan.   Cori Razor LCSW [phone]

## 2014-02-22 ENCOUNTER — Emergency Department (HOSPITAL_COMMUNITY): Payer: Medicare HMO

## 2014-02-22 ENCOUNTER — Emergency Department (HOSPITAL_COMMUNITY)
Admission: EM | Admit: 2014-02-22 | Discharge: 2014-02-22 | Disposition: A | Discharge status: Home / Self Care | Payer: Medicare HMO | Attending: Emergency Medicine | Admitting: Emergency Medicine

## 2014-02-22 ENCOUNTER — Encounter (HOSPITAL_COMMUNITY): Payer: Self-pay | Admitting: Emergency Medicine

## 2014-02-22 DIAGNOSIS — E785 Hyperlipidemia, unspecified: Secondary | ICD-10-CM | POA: Insufficient documentation

## 2014-02-22 DIAGNOSIS — R296 Repeated falls: Secondary | ICD-10-CM | POA: Insufficient documentation

## 2014-02-22 DIAGNOSIS — D510 Vitamin B12 deficiency anemia due to intrinsic factor deficiency: Secondary | ICD-10-CM | POA: Insufficient documentation

## 2014-02-22 DIAGNOSIS — F411 Generalized anxiety disorder: Secondary | ICD-10-CM | POA: Insufficient documentation

## 2014-02-22 DIAGNOSIS — Y9210 Unspecified residential institution as the place of occurrence of the external cause: Secondary | ICD-10-CM | POA: Insufficient documentation

## 2014-02-22 DIAGNOSIS — I2510 Atherosclerotic heart disease of native coronary artery without angina pectoris: Secondary | ICD-10-CM | POA: Insufficient documentation

## 2014-02-22 DIAGNOSIS — W19XXXA Unspecified fall, initial encounter: Secondary | ICD-10-CM

## 2014-02-22 DIAGNOSIS — S0083XA Contusion of other part of head, initial encounter: Secondary | ICD-10-CM

## 2014-02-22 DIAGNOSIS — S1093XA Contusion of unspecified part of neck, initial encounter: Secondary | ICD-10-CM

## 2014-02-22 DIAGNOSIS — T148XXA Other injury of unspecified body region, initial encounter: Secondary | ICD-10-CM

## 2014-02-22 DIAGNOSIS — F0390 Unspecified dementia without behavioral disturbance: Secondary | ICD-10-CM | POA: Insufficient documentation

## 2014-02-22 DIAGNOSIS — S01119A Laceration without foreign body of unspecified eyelid and periocular area, initial encounter: Secondary | ICD-10-CM

## 2014-02-22 DIAGNOSIS — S0003XA Contusion of scalp, initial encounter: Secondary | ICD-10-CM | POA: Insufficient documentation

## 2014-02-22 DIAGNOSIS — S0180XA Unspecified open wound of other part of head, initial encounter: Secondary | ICD-10-CM | POA: Insufficient documentation

## 2014-02-22 DIAGNOSIS — Z79899 Other long term (current) drug therapy: Secondary | ICD-10-CM | POA: Insufficient documentation

## 2014-02-22 DIAGNOSIS — M810 Age-related osteoporosis without current pathological fracture: Secondary | ICD-10-CM | POA: Insufficient documentation

## 2014-02-22 DIAGNOSIS — Y9389 Activity, other specified: Secondary | ICD-10-CM | POA: Insufficient documentation

## 2014-02-22 DIAGNOSIS — Z87891 Personal history of nicotine dependence: Secondary | ICD-10-CM | POA: Insufficient documentation

## 2014-02-22 DIAGNOSIS — E559 Vitamin D deficiency, unspecified: Secondary | ICD-10-CM | POA: Insufficient documentation

## 2014-02-22 NOTE — ED Notes (Signed)
[facility] c/o un witnessed fall.  A 1 inch laceration over left eye; bleeding controlled..  No complaints of pain in the neck or back.  Pt alert at baseline orientation, hx of dementia, in NAD.

## 2014-02-22 NOTE — ED Notes (Signed)
Pt being discharged with [facility].

## 2014-02-22 NOTE — ED Notes (Signed)
Pt continued to try and get out of the bed. Pt placed in reclining chair at the nurses station so she could be visible at all times.

## 2014-02-22 NOTE — ED Notes (Signed)
Pt provided activity blanket.

## 2014-02-22 NOTE — ED Provider Notes (Signed)
CSN: [id number]     Arrival date & time 02/22/14  1842 History   First MD Initiated Contact with Patient 02/22/14 1842     Chief Complaint  Patient presents with  . Fall  . Head Injury     (Consider location/radiation/quality/duration/timing/severity/associated sxs/prior Treatment) HPI Comments: 78 year old female with a history of dementia who presents after an unwitnessed fall. She is unable to provide history secondary to her dementia.  Level V caveat due to dementia  Patient is a 78 y.o. female presenting with fall and head injury.  Fall This is a new problem. The current episode started 1 to 2 hours ago. Episode frequency: Once. The problem has been resolved.  Head Injury   Past Medical History  Diagnosis Date  . Anemia   . Osteoporosis   . Coronary artery disease   . Dementia   . Hypertension   . Leukocytosis 07/26/2012  . Hyperlipidemia 07/26/2012  . Pernicious anemia 07/26/2012  . HTN (hypertension) 07/26/2012  . CAD (coronary artery disease) 07/26/2012    Cath 12/26/05: LAD 10-15% mid stenosis and distally, LCIRC small and patent, RCA 15-20% mid stenosis. EF 45%, LV with mild anterolateral and mid inferior wall hypokinesia  Myoview : 12/13/2009: No evidence of inducible reversible ischemia EF 77%  . Anxiety 07/26/2012  . History of tobacco use 07/26/2012  . Hyperparathyroidism 07/26/2012  . Vitamin D deficiency 07/26/2012   Past Surgical History  Procedure Laterality Date  . Tonsillectomy     Family History  Problem Relation Age of Onset  . Breast cancer Mother   . Lung cancer Father    History  Substance Use Topics  . Smoking status: Former Smoker -- 0.50 packs/day    Types: Cigarettes  . Smokeless tobacco: Not on file  . Alcohol Use: No   OB History   Grav Para Term Preterm Abortions TAB SAB Ect Mult Living                 Review of Systems  Unable to perform ROS: Dementia      Allergies  Review of patient's allergies indicates no known allergies.  Home  Medications   Current Outpatient Rx  Name  Route  Sig  Dispense  Refill  . acetaminophen (TYLENOL EX ST ARTHRITIS PAIN) 500 MG tablet   Oral   Take 1,000 mg by mouth every 6 (six) hours as needed. pain         . alendronate (FOSAMAX) 70 MG tablet   Oral   Take 70 mg by mouth every 7 (seven) days. Take with a full glass of water on an empty stomach.         . dextromethorphan-guaiFENesin (ROBITUSSIN-DM) 10-100 MG/5ML liquid   Oral   Take 5 mLs by mouth every 8 (eight) hours as needed. Cough and congestion         . ferrous sulfate 325 (65 FE) MG tablet   Oral   Take 325 mg by mouth daily with breakfast.         . LORazepam (ATIVAN) 0.5 MG tablet   Oral   Take 0.5 mg by mouth every 12 (twelve) hours.         . metoprolol (LOPRESSOR) 50 MG tablet   Oral   Take 50 mg by mouth daily.         . vitamin B-12 (CYANOCOBALAMIN) 1000 MCG tablet   Oral   Take 1,000 mcg by mouth daily.  BP 175/79  Pulse 65  Temp(Src) 98.3 F (36.8 C) (Oral)  Resp 18  SpO2 99% Physical Exam  Nursing note and vitals reviewed. Constitutional: She is oriented to person, place, and time. She appears well-developed and well-nourished. No distress.  HENT:  Head: Normocephalic. Head is with laceration (Over left eyebrow). Head is without raccoon's eyes and without Battle's sign.  Nose: Nose normal.  Eyes: Conjunctivae and EOM are normal. Pupils are equal, round, and reactive to light. No scleral icterus.  Neck: No spinous process tenderness and no muscular tenderness present.  Cardiovascular: Normal rate, regular rhythm, normal heart sounds and intact distal pulses.   No murmur heard. Pulmonary/Chest: Effort normal and breath sounds normal. She has no rales. She exhibits no tenderness.  Abdominal: Soft. There is no tenderness. There is no rebound and no guarding.  Musculoskeletal: Normal range of motion. She exhibits no edema and no tenderness.       Thoracic back: She exhibits no  tenderness and no bony tenderness.       Lumbar back: She exhibits no tenderness and no bony tenderness.  No evidence of trauma to extremities, except as noted.  2+ distal pulses.    Neurological: She is alert and oriented to person, place, and time.  Skin: Skin is warm and dry. No rash noted.  Psychiatric: She has a normal mood and affect.    ED Course  Procedures (including critical care time) Labs Review Labs Reviewed - No data to display Imaging Review Ct Cervical Spine Wo Contrast  02/22/2014   CLINICAL DATA:  Fall  EXAM: CT HEAD WITHOUT CONTRAST  CT CERVICAL SPINE WITHOUT CONTRAST  TECHNIQUE: Multidetector CT imaging of the head and cervical spine was performed following the standard protocol without intravenous contrast. Multiplanar CT image reconstructions of the cervical spine were also generated.  COMPARISON:  None.  FINDINGS: CT HEAD FINDINGS  Negative for acute intracranial hemorrhage, acute infarction, mass, mass effect, hydrocephalus or midline shift. Gray-white differentiation is preserved throughout. A advanced cerebral and cerebellar atrophy ex vacuo dilatation of the lateral ventricles. Small focal left super orbital scalp hematoma. No evidence of underlying fracture. The bilateral globes and orbits are intact and unremarkable. Normal aeration of the mastoid air cells and visualized paranasal sinuses.  CT CERVICAL SPINE FINDINGS  No acute fracture, malalignment or prevertebral soft tissue swelling. Name be Mild multilevel cervical spondylosis in the midcervical spine at C4-C5, C5-C6 and C6-C7. Heterogeneous thyroid gland. 9 mm low-attenuation nodule in the right gland. No acute soft tissue abnormality. Mild biapical pleural parenchymal scarring. No suspicious mass or nodule.  IMPRESSION: CT HEAD  1. No acute intracranial abnormality. 2. Advanced cerebral and cerebellar atrophy with ex vacuo dilatation of the lateral ventricles. 3. Small focal left supraorbital scalp hematoma without  evidence of underlying fracture. C SPINE  1. No acute fracture or malalignment. 2. Mild mid and lower cervical spondylosis. 3. Mild biapical pleural-parenchymal scarring.   Electronically Signed   By: Malachy Moan M.D.   On: 02/22/2014 19:51  All radiology studies independently viewed by me.      EKG Interpretation   Date/Time:  Wednesday February 22 2014 18:54:06 EDT Ventricular Rate:  64 PR Interval:  162 QRS Duration: 92 QT Interval:  394 QTC Calculation: 406 R Axis:   52 Text Interpretation:  Sinus rhythm No significant change was found  Confirmed by [facility]  MD, TREY ([id number]) on 02/22/2014 9:08:06 PM      MDM   Final diagnoses:  Fall  Eyebrow laceration  Contusion    History of dementia, unwitnessed fall, small lac above left eyebrow.  Plan head CT and cervical spine CT, EKG.    CT's negative.  No further injuries identified.  Eyebrow lac cleaned and does not need repair.    Candyce Churn III, MD 02/22/14 [phone]

## 2014-02-22 NOTE — ED Notes (Signed)
Pt trying to get out of bed. Pt repositioned in bed in [place]. Side rails up. Call light with patient, RN encouraged use. RN encouraged and promoted rest.

## 2014-02-22 NOTE — Discharge Instructions (Signed)

## 2014-07-18 ENCOUNTER — Inpatient Hospital Stay (HOSPITAL_COMMUNITY)
Admission: EM | Admit: 2014-07-18 | Discharge: 2014-07-24 | DRG: 481 | Disposition: A | Discharge status: Skilled Nursing Facility | Payer: Medicare HMO | Attending: Internal Medicine | Admitting: Internal Medicine

## 2014-07-18 DIAGNOSIS — I2510 Atherosclerotic heart disease of native coronary artery without angina pectoris: Secondary | ICD-10-CM | POA: Diagnosis present

## 2014-07-18 DIAGNOSIS — M25559 Pain in unspecified hip: Secondary | ICD-10-CM | POA: Diagnosis not present

## 2014-07-18 DIAGNOSIS — Y9210 Unspecified residential institution as the place of occurrence of the external cause: Secondary | ICD-10-CM | POA: Diagnosis present

## 2014-07-18 DIAGNOSIS — N390 Urinary tract infection, site not specified: Secondary | ICD-10-CM | POA: Diagnosis present

## 2014-07-18 DIAGNOSIS — F0390 Unspecified dementia without behavioral disturbance: Secondary | ICD-10-CM | POA: Diagnosis present

## 2014-07-18 DIAGNOSIS — F3289 Other specified depressive episodes: Secondary | ICD-10-CM | POA: Diagnosis present

## 2014-07-18 DIAGNOSIS — F329 Major depressive disorder, single episode, unspecified: Secondary | ICD-10-CM | POA: Diagnosis present

## 2014-07-18 DIAGNOSIS — S72143A Displaced intertrochanteric fracture of unspecified femur, initial encounter for closed fracture: Principal | ICD-10-CM | POA: Diagnosis present

## 2014-07-18 DIAGNOSIS — E559 Vitamin D deficiency, unspecified: Secondary | ICD-10-CM | POA: Diagnosis present

## 2014-07-18 DIAGNOSIS — D649 Anemia, unspecified: Secondary | ICD-10-CM | POA: Diagnosis not present

## 2014-07-18 DIAGNOSIS — E876 Hypokalemia: Secondary | ICD-10-CM | POA: Diagnosis not present

## 2014-07-18 DIAGNOSIS — F419 Anxiety disorder, unspecified: Secondary | ICD-10-CM

## 2014-07-18 DIAGNOSIS — D62 Acute posthemorrhagic anemia: Secondary | ICD-10-CM | POA: Diagnosis not present

## 2014-07-18 DIAGNOSIS — Z803 Family history of malignant neoplasm of breast: Secondary | ICD-10-CM

## 2014-07-18 DIAGNOSIS — F0280 Dementia in other diseases classified elsewhere without behavioral disturbance: Secondary | ICD-10-CM | POA: Diagnosis present

## 2014-07-18 DIAGNOSIS — Z87891 Personal history of nicotine dependence: Secondary | ICD-10-CM

## 2014-07-18 DIAGNOSIS — M810 Age-related osteoporosis without current pathological fracture: Secondary | ICD-10-CM | POA: Diagnosis present

## 2014-07-18 DIAGNOSIS — W010XXA Fall on same level from slipping, tripping and stumbling without subsequent striking against object, initial encounter: Secondary | ICD-10-CM | POA: Diagnosis present

## 2014-07-18 DIAGNOSIS — G309 Alzheimer's disease, unspecified: Secondary | ICD-10-CM | POA: Diagnosis present

## 2014-07-18 DIAGNOSIS — I959 Hypotension, unspecified: Secondary | ICD-10-CM | POA: Diagnosis present

## 2014-07-18 DIAGNOSIS — Z801 Family history of malignant neoplasm of trachea, bronchus and lung: Secondary | ICD-10-CM

## 2014-07-18 DIAGNOSIS — S72002D Fracture of unspecified part of neck of left femur, subsequent encounter for closed fracture with routine healing: Secondary | ICD-10-CM

## 2014-07-18 DIAGNOSIS — E785 Hyperlipidemia, unspecified: Secondary | ICD-10-CM | POA: Diagnosis present

## 2014-07-18 DIAGNOSIS — E213 Hyperparathyroidism, unspecified: Secondary | ICD-10-CM | POA: Diagnosis present

## 2014-07-18 DIAGNOSIS — S72142A Displaced intertrochanteric fracture of left femur, initial encounter for closed fracture: Secondary | ICD-10-CM

## 2014-07-18 DIAGNOSIS — I10 Essential (primary) hypertension: Secondary | ICD-10-CM | POA: Diagnosis present

## 2014-07-18 DIAGNOSIS — Z7982 Long term (current) use of aspirin: Secondary | ICD-10-CM

## 2014-07-18 DIAGNOSIS — D72829 Elevated white blood cell count, unspecified: Secondary | ICD-10-CM

## 2014-07-18 DIAGNOSIS — S72141A Displaced intertrochanteric fracture of right femur, initial encounter for closed fracture: Secondary | ICD-10-CM

## 2014-07-18 DIAGNOSIS — D510 Vitamin B12 deficiency anemia due to intrinsic factor deficiency: Secondary | ICD-10-CM | POA: Diagnosis present

## 2014-07-18 DIAGNOSIS — E860 Dehydration: Secondary | ICD-10-CM

## 2014-07-18 NOTE — ED Notes (Addendum)
According to personnel, unwitnessed fall but they advised PTAR that she was walking to the bathroom and she tripped over her blanket.  Personnel also said she had been laying there for at least 30 minutes.  PTAR said upon their arrival she was on the floor and she was not in any apparent pain.  PTAR said she did not have any pain until she was moved.  The patient is confused, not verbal, has incomprehensible sound.  Unknown if family was contacted.  She comes [employer]

## 2014-07-19 ENCOUNTER — Encounter (HOSPITAL_COMMUNITY): Payer: Self-pay | Admitting: Emergency Medicine

## 2014-07-19 ENCOUNTER — Emergency Department (HOSPITAL_COMMUNITY): Payer: Medicare HMO

## 2014-07-19 ENCOUNTER — Inpatient Hospital Stay (HOSPITAL_COMMUNITY): Payer: Medicare HMO

## 2014-07-19 DIAGNOSIS — D62 Acute posthemorrhagic anemia: Secondary | ICD-10-CM | POA: Diagnosis not present

## 2014-07-19 DIAGNOSIS — E785 Hyperlipidemia, unspecified: Secondary | ICD-10-CM | POA: Diagnosis present

## 2014-07-19 DIAGNOSIS — R1310 Dysphagia, unspecified: Secondary | ICD-10-CM | POA: Diagnosis not present

## 2014-07-19 DIAGNOSIS — Z87891 Personal history of nicotine dependence: Secondary | ICD-10-CM | POA: Diagnosis not present

## 2014-07-19 DIAGNOSIS — Z7982 Long term (current) use of aspirin: Secondary | ICD-10-CM | POA: Diagnosis not present

## 2014-07-19 DIAGNOSIS — F3289 Other specified depressive episodes: Secondary | ICD-10-CM | POA: Diagnosis present

## 2014-07-19 DIAGNOSIS — I10 Essential (primary) hypertension: Secondary | ICD-10-CM

## 2014-07-19 DIAGNOSIS — N390 Urinary tract infection, site not specified: Secondary | ICD-10-CM | POA: Diagnosis present

## 2014-07-19 DIAGNOSIS — I959 Hypotension, unspecified: Secondary | ICD-10-CM | POA: Diagnosis present

## 2014-07-19 DIAGNOSIS — E213 Hyperparathyroidism, unspecified: Secondary | ICD-10-CM | POA: Diagnosis present

## 2014-07-19 DIAGNOSIS — Z803 Family history of malignant neoplasm of breast: Secondary | ICD-10-CM | POA: Diagnosis not present

## 2014-07-19 DIAGNOSIS — S72009A Fracture of unspecified part of neck of unspecified femur, initial encounter for closed fracture: Secondary | ICD-10-CM | POA: Insufficient documentation

## 2014-07-19 DIAGNOSIS — R1319 Other dysphagia: Secondary | ICD-10-CM | POA: Diagnosis not present

## 2014-07-19 DIAGNOSIS — S72142A Displaced intertrochanteric fracture of left femur, initial encounter for closed fracture: Secondary | ICD-10-CM | POA: Diagnosis present

## 2014-07-19 DIAGNOSIS — E860 Dehydration: Secondary | ICD-10-CM

## 2014-07-19 DIAGNOSIS — S72143A Displaced intertrochanteric fracture of unspecified femur, initial encounter for closed fracture: Secondary | ICD-10-CM | POA: Diagnosis present

## 2014-07-19 DIAGNOSIS — M810 Age-related osteoporosis without current pathological fracture: Secondary | ICD-10-CM | POA: Diagnosis present

## 2014-07-19 DIAGNOSIS — W010XXA Fall on same level from slipping, tripping and stumbling without subsequent striking against object, initial encounter: Secondary | ICD-10-CM | POA: Diagnosis present

## 2014-07-19 DIAGNOSIS — I2510 Atherosclerotic heart disease of native coronary artery without angina pectoris: Secondary | ICD-10-CM | POA: Diagnosis present

## 2014-07-19 DIAGNOSIS — F329 Major depressive disorder, single episode, unspecified: Secondary | ICD-10-CM | POA: Diagnosis present

## 2014-07-19 DIAGNOSIS — M25559 Pain in unspecified hip: Secondary | ICD-10-CM | POA: Diagnosis present

## 2014-07-19 DIAGNOSIS — Y9210 Unspecified residential institution as the place of occurrence of the external cause: Secondary | ICD-10-CM | POA: Diagnosis present

## 2014-07-19 DIAGNOSIS — F0390 Unspecified dementia without behavioral disturbance: Secondary | ICD-10-CM

## 2014-07-19 DIAGNOSIS — E876 Hypokalemia: Secondary | ICD-10-CM | POA: Diagnosis not present

## 2014-07-19 DIAGNOSIS — E559 Vitamin D deficiency, unspecified: Secondary | ICD-10-CM | POA: Diagnosis present

## 2014-07-19 DIAGNOSIS — Z801 Family history of malignant neoplasm of trachea, bronchus and lung: Secondary | ICD-10-CM | POA: Diagnosis not present

## 2014-07-19 DIAGNOSIS — M6281 Muscle weakness (generalized): Secondary | ICD-10-CM | POA: Diagnosis not present

## 2014-07-19 DIAGNOSIS — D510 Vitamin B12 deficiency anemia due to intrinsic factor deficiency: Secondary | ICD-10-CM | POA: Diagnosis present

## 2014-07-19 DIAGNOSIS — F0280 Dementia in other diseases classified elsewhere without behavioral disturbance: Secondary | ICD-10-CM | POA: Diagnosis present

## 2014-07-19 DIAGNOSIS — F411 Generalized anxiety disorder: Secondary | ICD-10-CM

## 2014-07-19 LAB — URINALYSIS, ROUTINE W REFLEX MICROSCOPIC
Bilirubin Urine: NEGATIVE
Glucose, UA: 100 mg/dL — AB
Ketones, ur: 15 mg/dL — AB
NITRITE: NEGATIVE
PH: 6.5 (ref 5.0–8.0)
PROTEIN: 30 mg/dL — AB
Specific Gravity, Urine: 1.015 (ref 1.005–1.030)
UROBILINOGEN UA: 1 mg/dL (ref 0.0–1.0)

## 2014-07-19 LAB — COMPREHENSIVE METABOLIC PANEL
ALT: 12 U/L (ref 0–35)
ANION GAP: 12 (ref 5–15)
AST: 21 U/L (ref 0–37)
Albumin: 3.7 g/dL (ref 3.5–5.2)
Alkaline Phosphatase: 149 U/L — ABNORMAL HIGH (ref 39–117)
BUN: 15 mg/dL (ref 6–23)
CO2: 24 mEq/L (ref 19–32)
Calcium: 10.8 mg/dL — ABNORMAL HIGH (ref 8.4–10.5)
Chloride: 105 mEq/L (ref 96–112)
Creatinine, Ser: 0.73 mg/dL (ref 0.50–1.10)
GFR calc non Af Amer: 82 mL/min — ABNORMAL LOW (ref >90)
GLUCOSE: 148 mg/dL — AB (ref 70–99)
Potassium: 4.2 mEq/L (ref 3.7–5.3)
Sodium: 141 mEq/L (ref 137–147)
Total Bilirubin: 0.3 mg/dL (ref 0.3–1.2)
Total Protein: 7.4 g/dL (ref 6.0–8.3)

## 2014-07-19 LAB — CBC WITH DIFFERENTIAL/PLATELET
BASOS ABS: 0 10*3/uL (ref 0.0–0.1)
Basophils Relative: 0 % (ref 0–1)
EOS ABS: 0 10*3/uL (ref 0.0–0.7)
Eosinophils Relative: 0 % (ref 0–5)
HCT: 37.5 % (ref 36.0–46.0)
Hemoglobin: 12.5 g/dL (ref 12.0–15.0)
LYMPHS ABS: 1.1 10*3/uL (ref 0.7–4.0)
Lymphocytes Relative: 8 % — ABNORMAL LOW (ref 12–46)
MCH: 28.9 pg (ref 26.0–34.0)
MCHC: 33.3 g/dL (ref 30.0–36.0)
MCV: 86.8 fL (ref 78.0–100.0)
Monocytes Absolute: 0.7 10*3/uL (ref 0.1–1.0)
Monocytes Relative: 6 % (ref 3–12)
Neutro Abs: 11 10*3/uL — ABNORMAL HIGH (ref 1.7–7.7)
Neutrophils Relative %: 86 % — ABNORMAL HIGH (ref 43–77)
PLATELETS: 232 10*3/uL (ref 150–400)
RBC: 4.32 MIL/uL (ref 3.87–5.11)
RDW: 13.9 % (ref 11.5–15.5)
WBC: 12.9 10*3/uL — ABNORMAL HIGH (ref 4.0–10.5)

## 2014-07-19 LAB — MRSA PCR SCREENING: MRSA by PCR: NEGATIVE

## 2014-07-19 LAB — APTT: APTT: 29 s (ref 24–37)

## 2014-07-19 LAB — CK: Total CK: 111 U/L (ref 7–177)

## 2014-07-19 LAB — URINE MICROSCOPIC-ADD ON

## 2014-07-19 LAB — VITAMIN D 25 HYDROXY (VIT D DEFICIENCY, FRACTURES): Vit D, 25-Hydroxy: 12 ng/mL — ABNORMAL LOW (ref 30–89)

## 2014-07-19 LAB — PROTIME-INR
INR: 1.21 (ref 0.00–1.49)
Prothrombin Time: 15.3 seconds — ABNORMAL HIGH (ref 11.6–15.2)

## 2014-07-19 LAB — LIPASE, BLOOD: LIPASE: 38 U/L (ref 11–59)

## 2014-07-19 LAB — ABO/RH: ABO/RH(D): B NEG

## 2014-07-19 LAB — LACTIC ACID, PLASMA: Lactic Acid, Venous: 2.7 mmol/L — ABNORMAL HIGH (ref 0.5–2.2)

## 2014-07-19 MED ORDER — DEXTROSE-NACL 5-0.45 % IV SOLN: INTRAVENOUS | Status: DC

## 2014-07-19 MED ORDER — METOPROLOL TARTRATE 50 MG PO TABS
50.0000 mg | ORAL_TABLET | Freq: Every day | ORAL | Status: DC
Start: 2014-07-19 — End: 2014-07-24
  Administered 2014-07-19 – 2014-07-24 (×5): 50 mg via ORAL
  Filled 2014-07-19 (×6): qty 1

## 2014-07-19 MED ORDER — LORAZEPAM 0.5 MG PO TABS
0.5000 mg | ORAL_TABLET | Freq: Two times a day (BID) | ORAL | Status: DC
  Administered 2014-07-19 – 2014-07-24 (×9): 0.5 mg via ORAL
  Filled 2014-07-19 (×9): qty 1

## 2014-07-19 MED ORDER — ASPIRIN EC 81 MG PO TBEC
81.0000 mg | DELAYED_RELEASE_TABLET | Freq: Every day | ORAL | Status: DC
  Administered 2014-07-19: 81 mg via ORAL
  Filled 2014-07-19: qty 1

## 2014-07-19 MED ORDER — MORPHINE SULFATE 4 MG/ML IJ SOLN
4.0000 mg | Freq: Once | INTRAMUSCULAR | Status: AC
  Administered 2014-07-19: 4 mg via INTRAVENOUS
  Filled 2014-07-19: qty 1

## 2014-07-19 MED ORDER — DEXTROSE 5 % IV SOLN
1.0000 g | INTRAVENOUS | Status: DC
  Administered 2014-07-19 – 2014-07-20 (×2): 1 g via INTRAVENOUS
  Filled 2014-07-19 (×3): qty 10

## 2014-07-19 MED ORDER — FERROUS SULFATE 325 (65 FE) MG PO TABS
325.0000 mg | ORAL_TABLET | Freq: Every day | ORAL | Status: DC
  Administered 2014-07-19 – 2014-07-22 (×3): 325 mg via ORAL
  Filled 2014-07-19 (×6): qty 1

## 2014-07-19 MED ORDER — MORPHINE SULFATE 2 MG/ML IJ SOLN
0.5000 mg | INTRAMUSCULAR | Status: DC | PRN
  Administered 2014-07-19: 0.5 mg via INTRAVENOUS
  Filled 2014-07-19: qty 1

## 2014-07-19 MED ORDER — HEPARIN SODIUM (PORCINE) 5000 UNIT/ML IJ SOLN
5000.0000 [IU] | Freq: Three times a day (TID) | INTRAMUSCULAR | Status: DC
  Administered 2014-07-20 – 2014-07-22 (×6): 5000 [IU] via SUBCUTANEOUS
  Filled 2014-07-19 (×9): qty 1

## 2014-07-19 MED ORDER — QUETIAPINE FUMARATE 25 MG PO TABS
25.0000 mg | ORAL_TABLET | Freq: Two times a day (BID) | ORAL | Status: DC
  Administered 2014-07-19 – 2014-07-24 (×10): 25 mg via ORAL
  Filled 2014-07-19 (×12): qty 1

## 2014-07-19 MED ORDER — HYDROCODONE-ACETAMINOPHEN 5-325 MG PO TABS
1.0000 | ORAL_TABLET | Freq: Four times a day (QID) | ORAL | Status: DC | PRN
  Administered 2014-07-19: 1 via ORAL
  Administered 2014-07-20: 2 via ORAL
  Filled 2014-07-19: qty 1
  Filled 2014-07-19: qty 2

## 2014-07-19 MED ORDER — ASPIRIN EC 81 MG PO TBEC: 81.0000 mg | DELAYED_RELEASE_TABLET | Freq: Every day | ORAL | Status: DC

## 2014-07-19 MED ORDER — HEPARIN SODIUM (PORCINE) 5000 UNIT/ML IJ SOLN
5000.0000 [IU] | Freq: Three times a day (TID) | INTRAMUSCULAR | Status: AC
  Administered 2014-07-19 (×3): 5000 [IU] via SUBCUTANEOUS
  Filled 2014-07-19 (×2): qty 1

## 2014-07-19 MED ORDER — SODIUM CHLORIDE 0.9 % IV SOLN
INTRAVENOUS | Status: DC
  Administered 2014-07-19: 50 mL/h via INTRAVENOUS
  Administered 2014-07-19 – 2014-07-21 (×3): via INTRAVENOUS

## 2014-07-19 MED ORDER — SODIUM CHLORIDE 0.9 % IV BOLUS (SEPSIS)
1000.0000 mL | Freq: Once | INTRAVENOUS | Status: AC
  Administered 2014-07-19: 1000 mL via INTRAVENOUS

## 2014-07-19 NOTE — Clinical Social Work Placement (Addendum)
Clinical Social Work Department CLINICAL SOCIAL WORK PLACEMENT NOTE 07/19/2014  Patient:  Pamela Blackburn, Pamela Blackburn  Account Number:  [id number] Admit date:  07/18/2014  Clinical Social Worker:  Macario Golds, LCSW  Date/time:  07/19/2014 03:30 PM  Clinical Social Work is seeking post-discharge placement for this patient at the following level of care:   SKILLED NURSING   (*CSW will update this form in Epic as items are completed)   07/19/2014  Patient/family provided with [facility] Department of Clinical Social Work's list of facilities offering this level of care within the geographic area requested by the patient (or if unable, by the patient's family).  07/19/2014  Patient/family informed of their freedom to choose among providers that offer the needed level of care, that participate in Medicare, Medicaid or managed care program needed by the patient, have an available bed and are willing to accept the patient.  07/19/2014  Patient/family informed of [facility]' ownership interest in [facility], as well as of the fact that they are under no obligation to receive care at this facility.  PASARR submitted to EDS on 04/04/2012 PASARR number received on 04/04/2012  FL2 transmitted to all facilities in geographic area requested by pt/family on  07/19/2014 FL2 transmitted to all facilities within larger geographic area on   Patient informed that his/her managed care company has contracts with or will negotiate with  certain facilities, including the following:     Patient/family informed of bed offers received:  07/20/2014 Patient chooses bed at  [facility] Physician recommends and patient chooses bed at    Patient to be transferred to [facility] on  07/24/2014 Patient to be transferred to facility by [facility] Patient and family notified of transfer on  07/24/2014 Name of family member notified:   Esmond Plants by phone  The following physician  request were entered in Epic:   Additional Comments:

## 2014-07-19 NOTE — Progress Notes (Signed)
Patient admitted by Dr. Allena Katz this AM- please see H&P.   Intertrochanteric fracture of left femur  - possible mechanical fall- from facility - X-ray shows there is intertrochanteric fracture.  Orthopedic for surgery in AM  History of dementia.  Continue seroquel with an active as needed.  -high risk for delirium   UTI.  Culture pending IV ceftriaxone   Fall.  CT head and C-spine ok  Marlin Canary DO

## 2014-07-19 NOTE — ED Provider Notes (Signed)
CSN: [id number]     Arrival date & time 07/18/14  2345 History   First MD Initiated Contact with Patient 07/19/14 0033     Chief Complaint  Patient presents with  . Fall    According to personnel, unwitnessed fall but they advised PTAR that she was walking to the bathroom and she tripped over her blanket.  PTAR said upon their arrival she was on the floor and she was not in any apparent pain.     (Consider location/radiation/quality/duration/timing/severity/associated sxs/prior Treatment) HPI  Pamela Blackburn is a 78 year old female with a past medical history of dementia presenting to emergency department after a fall. History is unable to be obtained by the patient due to her dementia. Per EMS, the patient was found down and around at the nursing facility. Patient was down for approximately 30 minutes. The fall was not witnessed by any of the staff, the patient was found on their rounds. Patient is unable give a history and describe where she is having pain.    Past Medical History  Diagnosis Date  . Anemia   . Osteoporosis   . Coronary artery disease   . Dementia   . Hypertension   . Leukocytosis 07/26/2012  . Hyperlipidemia 07/26/2012  . Pernicious anemia 07/26/2012  . HTN (hypertension) 07/26/2012  . CAD (coronary artery disease) 07/26/2012    Cath 12/26/05: LAD 10-15% mid stenosis and distally, LCIRC small and patent, RCA 15-20% mid stenosis. EF 45%, LV with mild anterolateral and mid inferior wall hypokinesia  Myoview : 12/13/2009: No evidence of inducible reversible ischemia EF 77%  . Anxiety 07/26/2012  . History of tobacco use 07/26/2012  . Hyperparathyroidism 07/26/2012  . Vitamin D deficiency 07/26/2012   Past Surgical History  Procedure Laterality Date  . Tonsillectomy     Family History  Problem Relation Age of Onset  . Breast cancer Mother   . Lung cancer Father    History  Substance Use Topics  . Smoking status: Former Smoker -- 0.50 packs/day    Types: Cigarettes  . Smokeless  tobacco: Not on file  . Alcohol Use: No   OB History   Grav Para Term Preterm Abortions TAB SAB Ect Mult Living                 Review of Systems  Unable to perform ROS: Dementia      Allergies  Review of patient's allergies indicates no known allergies.  Home Medications   Prior to Admission medications   Medication Sig Start Date End Date Taking? Authorizing Provider  aspirin EC 81 MG tablet Take 81 mg by mouth daily.   Yes Historical Provider, MD  ferrous sulfate 325 (65 FE) MG tablet Take 325 mg by mouth daily with breakfast.   Yes Historical Provider, MD  LORazepam (ATIVAN) 0.5 MG tablet Take 0.5 mg by mouth every 12 (twelve) hours.   Yes Historical Provider, MD  metoprolol (LOPRESSOR) 50 MG tablet Take 50 mg by mouth daily.   Yes Historical Provider, MD  QUEtiapine (SEROQUEL) 50 MG tablet Take 25 mg by mouth 2 (two) times daily.   Yes Historical Provider, MD  vitamin B-12 (CYANOCOBALAMIN) 1000 MCG tablet Take 1,000 mcg by mouth daily.   Yes Historical Provider, MD   BP 122/85  Pulse 91  Temp(Src) 98.1 F (36.7 C) (Oral)  Resp 16  SpO2 100% Physical Exam  Nursing note and vitals reviewed. Constitutional: She appears well-developed and well-nourished. No distress.  HENT:  Head: Normocephalic and  atraumatic.  Nose: Nose normal.  Mouth/Throat: Oropharynx is clear and moist. No oropharyngeal exudate.  Eyes: Conjunctivae and EOM are normal. Pupils are equal, round, and reactive to light. No scleral icterus.  Neck: Normal range of motion. Neck supple. No JVD present. No tracheal deviation present. No thyromegaly present.  Cardiovascular: Normal rate, regular rhythm and normal heart sounds.  Exam reveals no gallop and no friction rub.   No murmur heard. Pulmonary/Chest: Effort normal and breath sounds normal. No respiratory distress. She has no wheezes. She exhibits no tenderness.  Abdominal: Soft. Bowel sounds are normal. She exhibits no distension and no mass. There is  no tenderness. There is no rebound and no guarding.  Musculoskeletal: She exhibits tenderness. She exhibits no edema.  Left lower extremity is shortened and externally rotated. There is tenderness to palpation of the proximal femur and hip. There is no notable swelling or ecchymosis.  Limited ROM of LLE  Lymphadenopathy:    She has no cervical adenopathy.  Neurological: She is alert.  Skin: Skin is warm and dry. No rash noted. She is not diaphoretic. No erythema. No pallor.    ED Course  Procedures (including critical care time) Labs Review Labs Reviewed  CBC WITH DIFFERENTIAL - Abnormal; Notable for the following:    WBC 12.9 (*)    Neutrophils Relative % 86 (*)    Neutro Abs 11.0 (*)    Lymphocytes Relative 8 (*)    All other components within normal limits  COMPREHENSIVE METABOLIC PANEL - Abnormal; Notable for the following:    Glucose, Bld 148 (*)    Calcium 10.8 (*)    Alkaline Phosphatase 149 (*)    GFR calc non Af Amer 82 (*)    All other components within normal limits  LACTIC ACID, PLASMA - Abnormal; Notable for the following:    Lactic Acid, Venous 2.7 (*)    All other components within normal limits  LIPASE, BLOOD  CK  URINALYSIS, ROUTINE W REFLEX MICROSCOPIC    Imaging Review Dg Chest 1 View  07/19/2014   CLINICAL DATA:  Fall.  Left lower extremity deformity.  EXAM: CHEST - 1 VIEW  COMPARISON:  07/27/2012  FINDINGS: The heart size and mediastinal contours are within normal limits. Both lungs are clear. The visualized skeletal structures are unremarkable.  IMPRESSION: No active disease.   Electronically Signed   By: Burman Nieves M.D.   On: 07/19/2014 01:39   Dg Hip Complete Left  07/19/2014   CLINICAL DATA:  Fall with left lower extremity deformity.  EXAM: LEFT HIP - COMPLETE 2+ VIEW  COMPARISON:  None.  FINDINGS: Comminuted inter trochanteric fracture of the left hip with varus angulation and displacement of lesser and greater trochanteric fragments. No evidence  of dislocation of the hip. The pelvis appears intact. Postoperative changes in the right hip.  IMPRESSION: Comminuted intertrochanteric fracture of the left hip.   Electronically Signed   By: Burman Nieves M.D.   On: 07/19/2014 01:34   Dg Femur Left  07/19/2014   CLINICAL DATA:  Fall.  Left lower extremity deformity.  EXAM: LEFT FEMUR - 2 VIEW  COMPARISON:  Left hip 07/19/2014  FINDINGS: Comminuted intertrochanteric fracture again demonstrated in the left hip with varus angulation. Mid and distal femoral shaft appears intact. Soft tissues are unremarkable.  IMPRESSION: Intertrochanteric fracture in the left hip. Left femur otherwise intact.   Electronically Signed   By: Burman Nieves M.D.   On: 07/19/2014 01:40  EKG Interpretation   Date/Time:  Wednesday July 19 2014 02:23:50 EDT Ventricular Rate:  76 PR Interval:  159 QRS Duration: 106 QT Interval:  415 QTC Calculation: 467 R Axis:   69 Text Interpretation:  Sinus rhythm No significant change since last  tracing Confirmed by Erroll Luna [phone]) on 07/19/2014 2:53:03 AM      MDM   Final diagnoses:  Intertrochanteric fracture of left femur, closed, initial encounter   Patient presents emergency department for evaluation of her fall. X-ray of the left lower extremity reveals a proximal, closed, comminuted left intertrochanteric fracture.  Patient's EKG, lab studies chest x-ray and urinalysis did not reveal an etiology for her fall. CK level is 111.  Patient had an episode of hypotension emergency department to the ED systolic, she did respond to a 1 L fluid bolus.    Orthopedic surgery was contacted and will see the patient in the morning and recommended admission to the hospitalist service.   Tomasita Crumble, MD 07/19/14 [phone]

## 2014-07-19 NOTE — Consult Note (Signed)
ORTHOPAEDIC CONSULTATION  REQUESTING PHYSICIAN: Geradine Girt, DO  Chief Complaint: left intertroch fracture   HPI: Pamela Blackburn is a 78 y.o. female who is severly demented with alzheimers. I have no history of her fall.   Past Medical History  Diagnosis Date  . Anemia   . Osteoporosis   . Coronary artery disease   . Dementia   . Hypertension   . Leukocytosis 07/26/2012  . Hyperlipidemia 07/26/2012  . Pernicious anemia 07/26/2012  . HTN (hypertension) 07/26/2012  . CAD (coronary artery disease) 07/26/2012    Cath 12/26/05: LAD 10-15% mid stenosis and distally, LCIRC small and patent, RCA 15-20% mid stenosis. EF 45%, LV with mild anterolateral and mid inferior wall hypokinesia  Myoview : 12/13/2009: No evidence of inducible reversible ischemia EF 77%  . Anxiety 07/26/2012  . History of tobacco use 07/26/2012  . Hyperparathyroidism 07/26/2012  . Vitamin D deficiency 07/26/2012   Past Surgical History  Procedure Laterality Date  . Tonsillectomy     History   Social History  . Marital Status: Legally Separated    Spouse Name: N/A    Number of Children: N/A  . Years of Education: N/A   Social History Main Topics  . Smoking status: Former Smoker -- 0.50 packs/day    Types: Cigarettes  . Smokeless tobacco: None  . Alcohol Use: No  . Drug Use: No  . Sexual Activity:    Other Topics Concern  . None   Social History Narrative  . None   Family History  Problem Relation Age of Onset  . Breast cancer Mother   . Lung cancer Father    No Known Allergies Prior to Admission medications   Medication Sig Start Date End Date Taking? Authorizing Provider  aspirin EC 81 MG tablet Take 81 mg by mouth daily.   Yes Historical Provider, MD  ferrous sulfate 325 (65 FE) MG tablet Take 325 mg by mouth daily with breakfast.   Yes Historical Provider, MD  LORazepam (ATIVAN) 0.5 MG tablet Take 0.5 mg by mouth every 12 (twelve) hours.   Yes Historical Provider, MD  metoprolol (LOPRESSOR) 50 MG  tablet Take 50 mg by mouth daily.   Yes Historical Provider, MD  QUEtiapine (SEROQUEL) 50 MG tablet Take 25 mg by mouth 2 (two) times daily.   Yes Historical Provider, MD  vitamin B-12 (CYANOCOBALAMIN) 1000 MCG tablet Take 1,000 mcg by mouth daily.   Yes Historical Provider, MD   Dg Chest 1 View  07/19/2014   CLINICAL DATA:  Fall.  Left lower extremity deformity.  EXAM: CHEST - 1 VIEW  COMPARISON:  07/27/2012  FINDINGS: The heart size and mediastinal contours are within normal limits. Both lungs are clear. The visualized skeletal structures are unremarkable.  IMPRESSION: No active disease.   Electronically Signed   By: Lucienne Capers M.D.   On: 07/19/2014 01:39   Dg Hip Complete Left  07/19/2014   CLINICAL DATA:  Fall with left lower extremity deformity.  EXAM: LEFT HIP - COMPLETE 2+ VIEW  COMPARISON:  None.  FINDINGS: Comminuted inter trochanteric fracture of the left hip with varus angulation and displacement of lesser and greater trochanteric fragments. No evidence of dislocation of the hip. The pelvis appears intact. Postoperative changes in the right hip.  IMPRESSION: Comminuted intertrochanteric fracture of the left hip.   Electronically Signed   By: Lucienne Capers M.D.   On: 07/19/2014 01:34   Dg Femur Left  07/19/2014   CLINICAL  DATA:  Fall.  Left lower extremity deformity.  EXAM: LEFT FEMUR - 2 VIEW  COMPARISON:  Left hip 07/19/2014  FINDINGS: Comminuted intertrochanteric fracture again demonstrated in the left hip with varus angulation. Mid and distal femoral shaft appears intact. Soft tissues are unremarkable.  IMPRESSION: Intertrochanteric fracture in the left hip. Left femur otherwise intact.   Electronically Signed   By: Lucienne Capers M.D.   On: 07/19/2014 01:40   Ct Head Wo Contrast  07/19/2014   CLINICAL DATA:  Fall.  Confusion.  EXAM: CT HEAD WITHOUT CONTRAST  CT CERVICAL SPINE WITHOUT CONTRAST  TECHNIQUE: Multidetector CT imaging of the head and cervical spine was performed  following the standard protocol without intravenous contrast. Multiplanar CT image reconstructions of the cervical spine were also generated.  COMPARISON:  CT head and cervical spine 02/22/2014  FINDINGS: CT HEAD FINDINGS  Diffuse cerebral atrophy. Mild ventricular dilatation consistent with central atrophy. Low-attenuation change in the deep white matter consistent with small vessel ischemia. No mass effect or midline shift. No abnormal extra-axial fluid collections. Gray-white matter junctions are distinct. Basal cisterns are not effaced. No evidence of acute intracranial hemorrhage. No depressed skull fractures. Visualized paranasal sinuses and mastoid air cells are not opacified.  CT CERVICAL SPINE FINDINGS  Diffuse bone demineralization. Degenerative changes throughout the cervical spine with narrowed cervical interspaces and endplate hypertrophic changes. Degenerative changes in the facet joints. Normal alignment of the cervical spine. Normal alignment of the facet joints. No vertebral compression deformities. No prevertebral soft tissue swelling. C1-2 articulation appears intact. No focal bone lesion or bone destruction. Bone cortex and trabecular architecture appear intact. Incidental note of thoracic scoliosis with convexity towards the right.  IMPRESSION: No acute intracranial abnormalities. Chronic atrophy and small vessel ischemic changes.  Normal alignment of the cervical spine. No displaced fractures identified. Degenerative changes.   Electronically Signed   By: Lucienne Capers M.D.   On: 07/19/2014 06:17   Ct Cervical Spine Wo Contrast  07/19/2014   CLINICAL DATA:  Fall.  Confusion.  EXAM: CT HEAD WITHOUT CONTRAST  CT CERVICAL SPINE WITHOUT CONTRAST  TECHNIQUE: Multidetector CT imaging of the head and cervical spine was performed following the standard protocol without intravenous contrast. Multiplanar CT image reconstructions of the cervical spine were also generated.  COMPARISON:  CT head and  cervical spine 02/22/2014  FINDINGS: CT HEAD FINDINGS  Diffuse cerebral atrophy. Mild ventricular dilatation consistent with central atrophy. Low-attenuation change in the deep white matter consistent with small vessel ischemia. No mass effect or midline shift. No abnormal extra-axial fluid collections. Gray-white matter junctions are distinct. Basal cisterns are not effaced. No evidence of acute intracranial hemorrhage. No depressed skull fractures. Visualized paranasal sinuses and mastoid air cells are not opacified.  CT CERVICAL SPINE FINDINGS  Diffuse bone demineralization. Degenerative changes throughout the cervical spine with narrowed cervical interspaces and endplate hypertrophic changes. Degenerative changes in the facet joints. Normal alignment of the cervical spine. Normal alignment of the facet joints. No vertebral compression deformities. No prevertebral soft tissue swelling. C1-2 articulation appears intact. No focal bone lesion or bone destruction. Bone cortex and trabecular architecture appear intact. Incidental note of thoracic scoliosis with convexity towards the right.  IMPRESSION: No acute intracranial abnormalities. Chronic atrophy and small vessel ischemic changes.  Normal alignment of the cervical spine. No displaced fractures identified. Degenerative changes.   Electronically Signed   By: Lucienne Capers M.D.   On: 07/19/2014 06:17    Positive ROS: All other systems have  been reviewed and were otherwise negative with the exception of those mentioned in the HPI and as above.  Labs cbc  Recent Labs  07/19/14 0108  WBC 12.9*  HGB 12.5  HCT 37.5  PLT 232    Labs inflam No results found for this basename: ESR, CRP,  in the last 72 hours  Labs coag No results found for this basename: INR, PT, PTT,  in the last 72 hours   Recent Labs  07/19/14 0108  NA 141  K 4.2  CL 105  CO2 24  GLUCOSE 148*  BUN 15  CREATININE 0.73  CALCIUM 10.8*    Physical Exam: Filed  Vitals:   07/19/14 0342  BP: 101/63  Pulse:   Temp: 97.6 F (36.4 C)  Resp: 20   General: Alert, no acute distress Cardiovascular: No pedal edema Respiratory: No cyanosis, no use of accessory musculature GI: No organomegaly, abdomen is soft and non-tender Skin: No lesions in the area of chief complaint other than those listed below in MSK exam.  Neurologic: Sensation intact distally Lymphatic: No axillary or cervical lymphadenopathy  MUSCULOSKELETAL:  Wiggles toes, pain with any ROM 2+ DP Other extremities are atraumatic with painless ROM and NVI.  Assessment: Left intertroch  Plan: IM nail in OR on 8/27 pending clearance.  Hold heparin morning of 8/27  Weight Bearing Status: Bedrest then WBAT post op PT VTE px: SCD's and Aspirin post op   Edmonia Lynch, D, MD Cell [phone]   07/19/2014 7:36 AM

## 2014-07-19 NOTE — ED Notes (Signed)
Patient transported to X-ray 

## 2014-07-19 NOTE — Clinical Social Work Note (Signed)
Clinical Social Work Department BRIEF PSYCHOSOCIAL ASSESSMENT 07/19/2014  Patient:  Pamela Blackburn, Pamela Blackburn     Account Number:  [id number]     Admit date:  07/18/2014  Clinical Social Worker:  Verl Blalock  Date/Time:  07/19/2014 04:30 PM  Referred by:  Physician  Date Referred:  07/19/2014 Referred for  SNF Placement   Other Referral:   Interview type:  Family Other interview type:   Spoke with patient husband over the phone    PSYCHOSOCIAL DATA Living Status:  FACILITY Admitted from facility:  [facility] Level of care:  Assisted Living Primary support name:  Saydi Kobel [phone] Primary support relationship to patient:  SPOUSE Degree of support available:   Strong    CURRENT CONCERNS Current Concerns  Post-Acute Placement   Other Concerns:    SOCIAL WORK ASSESSMENT / PLAN Clinical Social Worker spoke with patient husband over the phone to offer support and discuss patient needs at discharge.  Patient husband states that patient is a current resident at [facility] and he would like for her to return as soon as possible.  CSW spoke with patient husband about ST-SNF prior to return to [facility] in which patient husband was agreeable.  CSW has completed FL2 and will initiate SNF search in [facility].  CSW to follow up with patient husband regarding available bed offers.  CSW remains available for support and to facilitate patient discharge needs once medically ready.   Assessment/plan status:  Psychosocial Support/Ongoing Assessment of Needs Other assessment/ plan:   Information/referral to community resources:   Clinical Social Worker to provide patient husband with facility list when present at the hospital.  Patient husband plans to not make any decisions prior to patient surgery tomorrow.    CSW spoke with [facility] Administrator who states that patient can return at any time following SNF  stay.    PATIENT'S/FAMILY'S RESPONSE TO PLAN OF CARE: Patient is not alert and oriented at this time.  CSW spoke with patient husband over the phone who was very supportive and agreeable to do what was in patient best interest. PT/OT evaluations pending for after patient surgery to determine discharge venue recommendations.  Patient husband verbalized understanding of CSW role and appreciation for support.

## 2014-07-19 NOTE — H&P (Signed)
[facility] History and Physical  Patient: Pamela Blackburn  ZOX:[id number]  DOB: September 03, 1936  DOS: the patient was seen and examined on 07/19/2014 PCP: Kaleen Mask, MD  Chief Complaint: Fall  HPI: Pamela Blackburn is a 78 y.o. female with Past medical history of osteoporosis, dementia, hypertension, coronary artery disease mild. The patient presented with an unwitnessed fall. Patient was obtained from the nursing home personnel as the patient was not able to provide any history. Patient was phoned in her bathroom with her linen wrapped around her at a routine round by the aide at the nursing home. When they tried to  lift e the patient up, she was complaining of pain in her hip and therefore she was brought here for further evaluation. There was no significant change in her mental status after the fall. Patient at her baseline does not communicate significantly although she is able to speak. No focal deficit reported. No prior history of recurrent fall or no history of recent medication change reported. No recent fever or chills or diarrhea or burning urination reported.  The patient is coming from ALF. And at her baseline dependent for most of her ADL.  Review of Systems: as mentioned in the history of present illness.  A Comprehensive review of the other systems is negative.  Past Medical History  Diagnosis Date  . Anemia   . Osteoporosis   . Coronary artery disease   . Dementia   . Hypertension   . Leukocytosis 07/26/2012  . Hyperlipidemia 07/26/2012  . Pernicious anemia 07/26/2012  . HTN (hypertension) 07/26/2012  . CAD (coronary artery disease) 07/26/2012    Cath 12/26/05: LAD 10-15% mid stenosis and distally, LCIRC small and patent, RCA 15-20% mid stenosis. EF 45%, LV with mild anterolateral and mid inferior wall hypokinesia  Myoview : 12/13/2009: No evidence of inducible reversible ischemia EF 77%  . Anxiety 07/26/2012  . History of tobacco use 07/26/2012  . Hyperparathyroidism  07/26/2012  . Vitamin D deficiency 07/26/2012   Past Surgical History  Procedure Laterality Date  . Tonsillectomy     Social History:  reports that she has quit smoking. Her smoking use included Cigarettes. She smoked 0.50 packs per day. She does not have any smokeless tobacco history on file. She reports that she does not drink alcohol or use illicit drugs.  No Known Allergies  Family History  Problem Relation Age of Onset  . Breast cancer Mother   . Lung cancer Father     Prior to Admission medications   Medication Sig Start Date End Date Taking? Authorizing Provider  aspirin EC 81 MG tablet Take 81 mg by mouth daily.   Yes Historical Provider, MD  ferrous sulfate 325 (65 FE) MG tablet Take 325 mg by mouth daily with breakfast.   Yes Historical Provider, MD  LORazepam (ATIVAN) 0.5 MG tablet Take 0.5 mg by mouth every 12 (twelve) hours.   Yes Historical Provider, MD  metoprolol (LOPRESSOR) 50 MG tablet Take 50 mg by mouth daily.   Yes Historical Provider, MD  QUEtiapine (SEROQUEL) 50 MG tablet Take 25 mg by mouth 2 (two) times daily.   Yes Historical Provider, MD  vitamin B-12 (CYANOCOBALAMIN) 1000 MCG tablet Take 1,000 mcg by mouth daily.   Yes Historical Provider, MD    Physical Exam: Filed Vitals:   07/19/14 0015 07/19/14 0145 07/19/14 0245 07/19/14 0342  BP: 129/91 84/54 124/78 101/63  Pulse:  70 82   Temp:    97.6 F (36.4 C)  TempSrc:    Axillary  Resp: Weight:    54.8 kg (120 lb 13 oz)  SpO2:  96% 98% 100%    General: Alert, Awake and Oriented to Time, Place and Person. Appear in mild distress Eyes: PERRL ENT: Oral Mucosa clear moist. Neck: no JVD Cardiovascular: S1 and S2 Present, no Murmur, Peripheral Pulses Present Respiratory: Bilateral Air entry equal and Decreased, Clear to Auscultation, noCrackles, no wheezes Abdomen: Bowel Sound Present, Soft and Non tender Skin: no Rash Extremities: no Pedal edema, no calf tenderness left hip  rotated Neurologic: Grossly no focal neuro deficit.  Labs on Admission:  CBC:  Recent Labs Lab 07/19/14 0108  WBC 12.9*  NEUTROABS 11.0*  HGB 12.5  HCT 37.5  MCV 86.8  PLT 232    CMP     Component Value Date/Time   NA 141 07/19/2014 0108   K 4.2 07/19/2014 0108   CL 105 07/19/2014 0108   CO2 24 07/19/2014 0108   GLUCOSE 148* 07/19/2014 0108   BUN 15 07/19/2014 0108   CREATININE 0.73 07/19/2014 0108   CALCIUM 10.8* 07/19/2014 0108   CALCIUM 11.0* 07/26/2012 1503   PROT 7.4 07/19/2014 0108   ALBUMIN 3.7 07/19/2014 0108   AST 21 07/19/2014 0108   ALT 12 07/19/2014 0108   ALKPHOS 149* 07/19/2014 0108   BILITOT 0.3 07/19/2014 0108   GFRNONAA 82* 07/19/2014 0108   GFRAA >90 07/19/2014 0108     Recent Labs Lab 07/19/14 0108  LIPASE 38   No results found for this basename: AMMONIA,  in the last 168 hours   Recent Labs Lab 07/19/14 0108  CKTOTAL 111   BNP (last 3 results) No results found for this basename: PROBNP,  in the last 8760 hours  Radiological Exams on Admission: Dg Chest 1 View  07/19/2014   CLINICAL DATA:  Fall.  Left lower extremity deformity.  EXAM: CHEST - 1 VIEW  COMPARISON:  07/27/2012  FINDINGS: The heart size and mediastinal contours are within normal limits. Both lungs are clear. The visualized skeletal structures are unremarkable.  IMPRESSION: No active disease.   Electronically Signed   By: Burman Nieves M.D.   On: 07/19/2014 01:39   Dg Hip Complete Left  07/19/2014   CLINICAL DATA:  Fall with left lower extremity deformity.  EXAM: LEFT HIP - COMPLETE 2+ VIEW  COMPARISON:  None.  FINDINGS: Comminuted inter trochanteric fracture of the left hip with varus angulation and displacement of lesser and greater trochanteric fragments. No evidence of dislocation of the hip. The pelvis appears intact. Postoperative changes in the right hip.  IMPRESSION: Comminuted intertrochanteric fracture of the left hip.   Electronically Signed   By: Burman Nieves M.D.   On:  07/19/2014 01:34   Dg Femur Left  07/19/2014   CLINICAL DATA:  Fall.  Left lower extremity deformity.  EXAM: LEFT FEMUR - 2 VIEW  COMPARISON:  Left hip 07/19/2014  FINDINGS: Comminuted intertrochanteric fracture again demonstrated in the left hip with varus angulation. Mid and distal femoral shaft appears intact. Soft tissues are unremarkable.  IMPRESSION: Intertrochanteric fracture in the left hip. Left femur otherwise intact.   Electronically Signed   By: Burman Nieves M.D.   On: 07/19/2014 01:40    EKG: Independently reviewed. normal EKG, normal sinus rhythm. Assessment/Plan Principal Problem:   Intertrochanteric fracture of left femur Active Problems:   Dementia   Osteoporosis   HTN (hypertension)   CAD (coronary artery disease)  1. Intertrochanteric fracture of left femur the patient is presenting with a possible mechanical fall. At present she is unable to provide any history but she does not appear to have any significant neurological deficit. She has a left hip which is externally rotated. X-ray shows there is intertrochanteric fracture. Orthopedic has been consulted who will be following up with the patient. Patient has as daughter who is the next of kin for her. We will discuss with the daughter in the morning for further information. Overnight attempt was unsuccessful to reach.  2.History of dementia. Continue seroquel with an active as needed.  3.Possible UTI. I would treat her with IV ceftriaxone.  4.Fall. I will check CT head and C-spine  Consults: Orthopedics  DVT Prophylaxis: subcutaneous Heparin Nutrition: N.p.o.  Code Status: Full code presumed  Disposition: Admitted to inpatient in telemetry unit.  Author: Lynden Oxford, MD [facility] Pager: [phone] 07/19/2014, 6:02 AM    If 7PM-7AM, please contact night-coverage [email or web address] Password [email or web address]  **Disclaimer: This note may have been dictated with voice recognition software. Similar  sounding words can inadvertently be transcribed and this note may contain transcription errors which may not have been corrected upon publication of note.**

## 2014-07-20 ENCOUNTER — Inpatient Hospital Stay (HOSPITAL_COMMUNITY): Payer: Medicare HMO | Admitting: Certified Registered"

## 2014-07-20 ENCOUNTER — Encounter (HOSPITAL_COMMUNITY): Payer: Medicare HMO | Admitting: Certified Registered"

## 2014-07-20 ENCOUNTER — Inpatient Hospital Stay (HOSPITAL_COMMUNITY): Payer: Medicare HMO

## 2014-07-20 ENCOUNTER — Encounter (HOSPITAL_COMMUNITY)
Admission: EM | Disposition: A | Discharge status: Skilled Nursing Facility | Payer: Self-pay | Source: Home / Self Care | Attending: Internal Medicine

## 2014-07-20 ENCOUNTER — Encounter (HOSPITAL_COMMUNITY): Payer: Self-pay | Admitting: Certified Registered"

## 2014-07-20 DIAGNOSIS — S72009D Fracture of unspecified part of neck of unspecified femur, subsequent encounter for closed fracture with routine healing: Secondary | ICD-10-CM

## 2014-07-20 HISTORY — PX: INTRAMEDULLARY (IM) NAIL INTERTROCHANTERIC: SHX5875

## 2014-07-20 LAB — URINE CULTURE
Colony Count: NO GROWTH
Culture: NO GROWTH

## 2014-07-20 LAB — SURGICAL PCR SCREEN
MRSA, PCR: NEGATIVE
Staphylococcus aureus: NEGATIVE

## 2014-07-20 SURGERY — FIXATION, FRACTURE, INTERTROCHANTERIC, WITH INTRAMEDULLARY ROD
Anesthesia: General | Site: Hip | Laterality: Left

## 2014-07-20 MED ORDER — CEFAZOLIN SODIUM-DEXTROSE 2-3 GM-% IV SOLR
2.0000 g | Freq: Four times a day (QID) | INTRAVENOUS | Status: AC
  Administered 2014-07-20 (×2): 2 g via INTRAVENOUS
  Filled 2014-07-20 (×3): qty 50

## 2014-07-20 MED ORDER — PHENYLEPHRINE 40 MCG/ML (10ML) SYRINGE FOR IV PUSH (FOR BLOOD PRESSURE SUPPORT)
PREFILLED_SYRINGE | INTRAVENOUS | Status: AC
  Filled 2014-07-20: qty 10

## 2014-07-20 MED ORDER — ASPIRIN EC 325 MG PO TBEC: 325.0000 mg | DELAYED_RELEASE_TABLET | Freq: Every day | ORAL | Status: DC

## 2014-07-20 MED ORDER — ROCURONIUM BROMIDE 50 MG/5ML IV SOLN
INTRAVENOUS | Status: AC
  Filled 2014-07-20: qty 1

## 2014-07-20 MED ORDER — PROPOFOL 10 MG/ML IV BOLUS
INTRAVENOUS | Status: DC | PRN
  Administered 2014-07-20: 80 mg via INTRAVENOUS

## 2014-07-20 MED ORDER — ACETAMINOPHEN 325 MG PO TABS: 650.0000 mg | ORAL_TABLET | Freq: Four times a day (QID) | ORAL | Status: DC | PRN

## 2014-07-20 MED ORDER — EPHEDRINE SULFATE 50 MG/ML IJ SOLN
INTRAMUSCULAR | Status: AC
  Filled 2014-07-20: qty 1

## 2014-07-20 MED ORDER — HYDROCODONE-ACETAMINOPHEN 5-325 MG PO TABS: 1.0000 | ORAL_TABLET | ORAL | Status: DC | PRN

## 2014-07-20 MED ORDER — PROPOFOL 10 MG/ML IV BOLUS
INTRAVENOUS | Status: AC
  Filled 2014-07-20: qty 20

## 2014-07-20 MED ORDER — LIDOCAINE HCL (CARDIAC) 20 MG/ML IV SOLN
INTRAVENOUS | Status: DC | PRN
  Administered 2014-07-20: 15 mg via INTRAVENOUS

## 2014-07-20 MED ORDER — CEFAZOLIN SODIUM-DEXTROSE 2-3 GM-% IV SOLR
INTRAVENOUS | Status: AC
  Filled 2014-07-20: qty 50

## 2014-07-20 MED ORDER — FENTANYL CITRATE 0.05 MG/ML IJ SOLN
INTRAMUSCULAR | Status: AC
  Filled 2014-07-20: qty 5

## 2014-07-20 MED ORDER — FENTANYL CITRATE 0.05 MG/ML IJ SOLN
INTRAMUSCULAR | Status: AC
  Filled 2014-07-20: qty 2

## 2014-07-20 MED ORDER — GLYCOPYRROLATE 0.2 MG/ML IJ SOLN
INTRAMUSCULAR | Status: DC | PRN
  Administered 2014-07-20: 0.4 mg via INTRAVENOUS

## 2014-07-20 MED ORDER — NEOSTIGMINE METHYLSULFATE 10 MG/10ML IV SOLN
INTRAVENOUS | Status: DC | PRN
  Administered 2014-07-20: 3 mg via INTRAVENOUS

## 2014-07-20 MED ORDER — FENTANYL CITRATE 0.05 MG/ML IJ SOLN
25.0000 ug | INTRAMUSCULAR | Status: DC | PRN
  Administered 2014-07-20: 25 ug via INTRAVENOUS

## 2014-07-20 MED ORDER — DOCUSATE SODIUM 100 MG PO CAPS: 100.0000 mg | ORAL_CAPSULE | Freq: Two times a day (BID) | ORAL | Status: AC

## 2014-07-20 MED ORDER — CEFAZOLIN SODIUM-DEXTROSE 2-3 GM-% IV SOLR
INTRAVENOUS | Status: DC | PRN
  Administered 2014-07-20: 2 g via INTRAVENOUS

## 2014-07-20 MED ORDER — ROCURONIUM BROMIDE 100 MG/10ML IV SOLN
INTRAVENOUS | Status: DC | PRN
  Administered 2014-07-20: 25 mg via INTRAVENOUS

## 2014-07-20 MED ORDER — ACETAMINOPHEN 650 MG RE SUPP: 650.0000 mg | Freq: Four times a day (QID) | RECTAL | Status: DC | PRN

## 2014-07-20 MED ORDER — LACTATED RINGERS IV SOLN
INTRAVENOUS | Status: DC | PRN
  Administered 2014-07-20: 07:00:00 via INTRAVENOUS

## 2014-07-20 MED ORDER — DROPERIDOL 2.5 MG/ML IJ SOLN
0.6250 mg | INTRAMUSCULAR | Status: DC | PRN
  Filled 2014-07-20: qty 0.25

## 2014-07-20 MED ORDER — MENTHOL 3 MG MT LOZG
1.0000 | LOZENGE | OROMUCOSAL | Status: DC | PRN
  Filled 2014-07-20: qty 9

## 2014-07-20 MED ORDER — SODIUM CHLORIDE 0.9 % IJ SOLN
INTRAMUSCULAR | Status: AC
  Filled 2014-07-20: qty 10

## 2014-07-20 MED ORDER — 0.9 % SODIUM CHLORIDE (POUR BTL) OPTIME
TOPICAL | Status: DC | PRN
  Administered 2014-07-20: 1000 mL

## 2014-07-20 MED ORDER — PHENOL 1.4 % MT LIQD
1.0000 | OROMUCOSAL | Status: DC | PRN
Start: 2014-07-20 — End: 2014-07-24
  Filled 2014-07-20: qty 177

## 2014-07-20 MED ORDER — ASPIRIN EC 325 MG PO TBEC
325.0000 mg | DELAYED_RELEASE_TABLET | Freq: Every day | ORAL | Status: DC
  Administered 2014-07-21 – 2014-07-23 (×3): 325 mg via ORAL
  Filled 2014-07-20 (×5): qty 1

## 2014-07-20 MED ORDER — ONDANSETRON HCL 4 MG/2ML IJ SOLN
INTRAMUSCULAR | Status: DC | PRN
  Administered 2014-07-20: 4 mg via INTRAVENOUS

## 2014-07-20 MED ORDER — DEXAMETHASONE SODIUM PHOSPHATE 4 MG/ML IJ SOLN
INTRAMUSCULAR | Status: DC | PRN
  Administered 2014-07-20: 4 mg via INTRAVENOUS

## 2014-07-20 MED ORDER — SUCCINYLCHOLINE CHLORIDE 20 MG/ML IJ SOLN
INTRAMUSCULAR | Status: AC
  Filled 2014-07-20: qty 1

## 2014-07-20 MED ORDER — DEXAMETHASONE SODIUM PHOSPHATE 4 MG/ML IJ SOLN
INTRAMUSCULAR | Status: AC
  Filled 2014-07-20: qty 1

## 2014-07-20 MED ORDER — LIDOCAINE HCL (CARDIAC) 20 MG/ML IV SOLN
INTRAVENOUS | Status: AC
  Filled 2014-07-20: qty 5

## 2014-07-20 MED ORDER — FENTANYL CITRATE 0.05 MG/ML IJ SOLN
INTRAMUSCULAR | Status: DC | PRN
  Administered 2014-07-20 (×2): 50 ug via INTRAVENOUS

## 2014-07-20 SURGICAL SUPPLY — 49 items
BIT DRILL AO GAMMA 4.2X180 (BIT) ×1 IMPLANT
COVER MAYO STAND STRL (DRAPES) ×2 IMPLANT
COVER PERINEAL POST (MISCELLANEOUS) ×2 IMPLANT
COVER SURGICAL LIGHT HANDLE (MISCELLANEOUS) ×2 IMPLANT
DRAPE STERI IOBAN 125X83 (DRAPES) ×2 IMPLANT
DRSG ADAPTIC 3X8 NADH LF (GAUZE/BANDAGES/DRESSINGS) ×2 IMPLANT
DRSG MEPILEX BORDER 4X4 (GAUZE/BANDAGES/DRESSINGS) ×3 IMPLANT
DRSG TEGADERM 2-3/8X2-3/4 SM (GAUZE/BANDAGES/DRESSINGS) ×2 IMPLANT
DRSG TEGADERM 4X4.75 (GAUZE/BANDAGES/DRESSINGS) ×2 IMPLANT
DURAPREP 26ML APPLICATOR (WOUND CARE) ×2 IMPLANT
ELECT REM PT RETURN 9FT ADLT (ELECTROSURGICAL) ×2
ELECTRODE REM PT RTRN 9FT ADLT (ELECTROSURGICAL) ×1 IMPLANT
GAUZE SPONGE 4X4 12PLY STRL (GAUZE/BANDAGES/DRESSINGS) ×2 IMPLANT
GAUZE XEROFORM 5X9 LF (GAUZE/BANDAGES/DRESSINGS) ×2 IMPLANT
GLOVE BIO SURGEON STRL SZ7.5 (GLOVE) ×4 IMPLANT
GLOVE BIOGEL PI IND STRL 6.5 (GLOVE) IMPLANT
GLOVE BIOGEL PI IND STRL 8 (GLOVE) ×1 IMPLANT
GLOVE BIOGEL PI INDICATOR 6.5 (GLOVE) ×1
GLOVE BIOGEL PI INDICATOR 8 (GLOVE) ×1
GLOVE BIOGEL PI ORTHO PRO SZ8 (GLOVE) ×1
GLOVE PI ORTHO PRO STRL SZ8 (GLOVE) IMPLANT
GOWN STRL REUS W/ TWL LRG LVL3 (GOWN DISPOSABLE) ×1 IMPLANT
GOWN STRL REUS W/TWL LRG LVL3 (GOWN DISPOSABLE) ×2
GUIDEROD T2 3X1000 (ROD) ×1 IMPLANT
K-WIRE  3.2X450M STR (WIRE) ×1
K-WIRE 3.2X450M STR (WIRE) ×1
KIT NAIL LONG 10X360MMX125 (Nail) ×1 IMPLANT
KIT ROOM TURNOVER OR (KITS) ×2 IMPLANT
KWIRE 3.2X450M STR (WIRE) IMPLANT
LINER BOOT UNIVERSAL DISP (MISCELLANEOUS) ×2 IMPLANT
MANIFOLD NEPTUNE II (INSTRUMENTS) ×2 IMPLANT
NS IRRIG 1000ML POUR BTL (IV SOLUTION) ×2 IMPLANT
PACK GENERAL/GYN (CUSTOM PROCEDURE TRAY) ×2 IMPLANT
PAD ARMBOARD 7.5X6 YLW CONV (MISCELLANEOUS) ×4 IMPLANT
SCREW LAG GAMMA 3 TI 10.5X105M (Screw) ×1 IMPLANT
SCREW LOCKING T2 F/T  5MMX40MM (Screw) ×1 IMPLANT
SCREW LOCKING T2 F/T  5X37.5MM (Screw) ×1 IMPLANT
SCREW LOCKING T2 F/T 5MMX40MM (Screw) IMPLANT
SCREW LOCKING T2 F/T 5X37.5MM (Screw) IMPLANT
STAPLER VISISTAT 35W (STAPLE) ×2 IMPLANT
STRIP CLOSURE SKIN 1/2X4 (GAUZE/BANDAGES/DRESSINGS) ×3 IMPLANT
SUT MNCRL AB 4-0 PS2 18 (SUTURE) ×2 IMPLANT
SUT MON AB 2-0 CT1 27 (SUTURE) ×2 IMPLANT
SUT VIC AB 0 CT1 27 (SUTURE) ×2
SUT VIC AB 0 CT1 27XBRD ANBCTR (SUTURE) ×1 IMPLANT
TOWEL OR 17X24 6PK STRL BLUE (TOWEL DISPOSABLE) ×2 IMPLANT
TOWEL OR 17X26 10 PK STRL BLUE (TOWEL DISPOSABLE) ×2 IMPLANT
TOWEL OR NON WOVEN STRL DISP B (DISPOSABLE) ×2 IMPLANT
WATER STERILE IRR 1000ML POUR (IV SOLUTION) IMPLANT

## 2014-07-20 NOTE — Evaluation (Signed)
Occupational Therapy Evaluation Patient Details Name: Pamela Blackburn MRN: [id number] DOB: 1936/03/27 Today's Date: 07/20/2014    History of Present Illness This 78 y.o. female with h/o severe dementia admitted from memory care facility after being found on the floor in the bathroom indicating Lt. hip pain.  xray showed Lt. interotrochanteric hip fracture.  Underwent IM nailing.  Pt is WBAT   Clinical Impression   .Pt admitted with above. Pt is unable to engage in any ADL activities.  She requires total A +2 with bed mobility.  She will need SNF level rehab at discharge and all other OT needs can be addressed at SNF.  Will sign off.    Follow Up Recommendations  SNF    Equipment Recommendations  None recommended by OT    Recommendations for Other Services       Precautions / Restrictions Precautions Precautions: Fall Precaution Comments: severe dementia Restrictions Weight Bearing Restrictions: Yes LLE Weight Bearing: Weight bearing as tolerated      Mobility Bed Mobility Overal bed mobility: +2 for physical assistance;Needs Assistance Bed Mobility: Rolling Rolling: Total assist;+2 for physical assistance         General bed mobility comments: Pt pulling at therapist and CNA, unable to assist with task.  Screams in pain when turned  Transfers                 General transfer comment: did not attempt    Balance                                            ADL Overall ADL's : Needs assistance/impaired Eating/Feeding: Total assistance;Bed level Eating/Feeding Details (indicate cue type and reason): Pt did reach for and hold cup spontaneously, but unable to complete task of bringing cup to mouth - required mod A to drink  Grooming: Wash/dry hands;Wash/dry face;Oral care;Brushing hair;Total assistance;Bed level Grooming Details (indicate cue type and reason): Pt does not seem to recognize objects, nor how to use them.  Hand over hand assist  provided to comb hair, but pt made no attempt to assist or take over task  Upper Body Bathing: Total assistance;Bed level   Lower Body Bathing: +2 for physical assistance;Total assistance;Bed level   Upper Body Dressing : Total assistance;Bed level   Lower Body Dressing: Total assistance;+2 for physical assistance;Bed level   Toilet Transfer: Total assistance (unable)   Toileting- Clothing Manipulation and Hygiene: Total assistance;+2 for physical assistance;Bed level       Functional mobility during ADLs: Total assistance;+2 for physical assistance General ADL Comments: Pt seen bed level.  Unable to engage in simple grooming activities     Vision                     Perception     Praxis      Pertinent Vitals/Pain Pain Assessment: Faces Faces Pain Scale: Hurts whole lot Pain Location: Unable to determine - likely Lt hip with attempts at rolling  Pain Intervention(s): Limited activity within patient's tolerance     Hand Dominance  (unknown)   Extremity/Trunk Assessment Upper Extremity Assessment Upper Extremity Assessment: Overall WFL for tasks assessed (pt spontaneously moving Bil. UEs pulling on objects)   Lower Extremity Assessment Lower Extremity Assessment: Defer to PT evaluation       Communication Communication Communication: Expressive difficulties   Cognition Arousal/Alertness: Awake/alert Behavior During  Therapy: Restless Overall Cognitive Status: No family/caregiver present to determine baseline cognitive functioning (Pt with h/o severe dementia)                     General Comments       Exercises       Shoulder Instructions      Home Living Family/patient expects to be discharged to:: Skilled nursing facility                                        Prior Functioning/Environment Level of Independence: Needs assistance    ADL's / Homemaking Assistance Needed: RN spoke with staff at facility where she  resided.  She required extensive assistance with BADLs Communication / Swallowing Assistance Needed: Speech non sensical most of the time      OT Diagnosis: Generalized weakness;Cognitive deficits;Acute pain   OT Problem List: Decreased activity tolerance;Decreased cognition;Decreased safety awareness;Decreased knowledge of use of DME or AE;Pain   OT Treatment/Interventions:      OT Goals(Current goals can be found in the care plan section) Acute Rehab OT Goals OT Goal Formulation: Patient unable to participate in goal setting  OT Frequency:     Barriers to D/C:            Co-evaluation              End of Session Nurse Communication: Mobility status  Activity Tolerance: Patient limited by pain;Other (comment) (cognitive status) Patient left: in bed;with call bell/phone within reach;with bed alarm set   Time: 1610-9604 OT Time Calculation (min): 12 min Charges:  OT General Charges $OT Visit: 1 Procedure OT Evaluation $Initial OT Evaluation Tier I: 1 Procedure G-Codes:    Tymber Stallings, Ursula Alert M 10-Aug-2014, 5:11 PM

## 2014-07-20 NOTE — Progress Notes (Addendum)
PROGRESS NOTE    Pamela Blackburn:[id number] DOB: 1936/04/01 DOA: 07/18/2014 PCP: Kaleen Mask, MD  HPI/Brief narrative 78 year old female with history of osteoporosis, dementia, hypertension, CAD, presented from nursing home following an unwitnessed fall and right hip pain. She was found to have intertrochanteric fracture of left femur and underwent surgical fixation 8/27.  Assessment/Plan:  1. Intertrochanteric fracture of left femur: Sustained following mechanical fall. Orthopedics performed ORIF/IM nailing on 07/20/14. Management per orthopedics who recommend weight bearing as tolerated and aspirin 325 mg daily for DVT prophylaxis for 4 weeks post procedure. Will need SNF. 2. History of advanced dementia: Mental status at baseline. 3. Presumed UTI: Patient had been empirically started on IV Rocephin but urine culture is no growth-DC Rocephin. 4. Mechanical fall: No acute findings on CT head or cervical spine. 5. Hypertension: Controlled. Continue metoprolol.   Code Status: Full Family Communication: None at bedside Disposition Plan: SNF when medically stable   Consultants:  Orthopedics  Procedures:  ORIF left hip 8/27  Foley catheter  Antibiotics:  Rocephin- DC'ed  Subjective: Patient pleasantly confused. Does not report complaints.  Objective: Filed Vitals:   07/20/14 1000 07/20/14 1025 07/20/14 1200 07/20/14 1246  BP: 127/70   121/88  Pulse: 68   105  Temp:  98.4 F (36.9 C)  97.9 F (36.6 C)  TempSrc:    Oral  Resp:   18 16  Weight:      SpO2: 100%   97%    Intake/Output Summary (Last 24 hours) at 07/20/14 1745 Last data filed at 07/20/14 0930  Gross per 24 hour  Intake   1357 ml  Output   2000 ml  Net   -643 ml   Filed Weights   07/19/14 0342  Weight: 54.8 kg (120 lb 13 oz)     Exam:  General exam: Moderately built and frail elderly female lying comfortably in bed. She was seen postoperatively in her room. Respiratory system:  Clear. No increased work of breathing. Cardiovascular system: S1 & S2 heard, RRR. No JVD, murmurs, gallops, clicks or pedal edema. Telemetry: Sinus rhythm. Gastrointestinal system: Abdomen is nondistended, soft and nontender. Normal bowel sounds heard. Central nervous system: Alert and oriented only to self. No focal neurological deficits. Extremities: Symmetric 5 x 5 power. Left hip surgical site clean, dry.   Data Reviewed: Basic Metabolic Panel:  Recent Labs Lab 07/19/14 0108  NA 141  K 4.2  CL 105  CO2 24  GLUCOSE 148*  BUN 15  CREATININE 0.73  CALCIUM 10.8*   Liver Function Tests:  Recent Labs Lab 07/19/14 0108  AST 21  ALT 12  ALKPHOS 149*  BILITOT 0.3  PROT 7.4  ALBUMIN 3.7    Recent Labs Lab 07/19/14 0108  LIPASE 38   No results found for this basename: AMMONIA,  in the last 168 hours CBC:  Recent Labs Lab 07/19/14 0108  WBC 12.9*  NEUTROABS 11.0*  HGB 12.5  HCT 37.5  MCV 86.8  PLT 232   Cardiac Enzymes:  Recent Labs Lab 07/19/14 0108  CKTOTAL 111   BNP (last 3 results) No results found for this basename: PROBNP,  in the last 8760 hours CBG: No results found for this basename: GLUCAP,  in the last 168 hours  Recent Results (from the past 240 hour(s))  MRSA PCR SCREENING     Status: None   Collection Time    07/19/14  3:50 AM      Result Value Ref Range Status  MRSA by PCR NEGATIVE  NEGATIVE Final   Comment:            The GeneXpert MRSA Assay (FDA     approved for NASAL specimens     only), is one component of a     comprehensive MRSA colonization     surveillance program. It is not     intended to diagnose MRSA     infection nor to guide or     monitor treatment for     MRSA infections.  URINE CULTURE     Status: None   Collection Time    07/19/14 12:46 PM      Result Value Ref Range Status   Specimen Description URINE, CATHETERIZED   Final   Special Requests NONE   Final   Culture  Setup Time     Final   Value:  07/19/2014 16:07     Performed at [facility] Count     Final   Value: NO GROWTH     Performed at [facility]   Culture     Final   Value: NO GROWTH     Performed at [facility]   Report Status 07/20/2014 FINAL   Final  SURGICAL PCR SCREEN     Status: None   Collection Time    07/20/14 12:02 AM      Result Value Ref Range Status   MRSA, PCR NEGATIVE  NEGATIVE Final   Staphylococcus aureus NEGATIVE  NEGATIVE Final   Comment:            The Xpert SA Assay (FDA     approved for NASAL specimens     in patients over 23 years of age),     is one component of     a comprehensive surveillance     program.  Test performance has     been validated by [employer] for patients greater     than or equal to 32 year old.     It is not intended     to diagnose infection nor to     guide or monitor treatment.        Studies: Dg Chest 1 View  07/19/2014   CLINICAL DATA:  Fall.  Left lower extremity deformity.  EXAM: CHEST - 1 VIEW  COMPARISON:  07/27/2012  FINDINGS: The heart size and mediastinal contours are within normal limits. Both lungs are clear. The visualized skeletal structures are unremarkable.  IMPRESSION: No active disease.   Electronically Signed   By: Burman Nieves M.D.   On: 07/19/2014 01:39   Dg Hip Complete Left  07/19/2014   CLINICAL DATA:  Fall with left lower extremity deformity.  EXAM: LEFT HIP - COMPLETE 2+ VIEW  COMPARISON:  None.  FINDINGS: Comminuted inter trochanteric fracture of the left hip with varus angulation and displacement of lesser and greater trochanteric fragments. No evidence of dislocation of the hip. The pelvis appears intact. Postoperative changes in the right hip.  IMPRESSION: Comminuted intertrochanteric fracture of the left hip.   Electronically Signed   By: Burman Nieves M.D.   On: 07/19/2014 01:34   Dg Femur Left  07/20/2014   CLINICAL DATA:  Intertrochanteric fracture of the proximal left femur.  EXAM:  LEFT FEMUR - 2 VIEW  COMPARISON:  Radiographs dated 07/19/2014  FINDINGS: Intra medullary nail has been inserted as well as a lag screw. Distal fixation screw has been  inserted. Alignment and position of the fracture is near anatomic.  IMPRESSION: Open reduction and internal fixation of intertrochanteric fracture of the proximal left femur.   Electronically Signed   By: Geanie Cooley M.D.   On: 07/20/2014 16:14   Dg Femur Left  07/20/2014   CLINICAL DATA:  Nailing LEFT hip  EXAM: LEFT FEMUR - 2 VIEW; DG C-ARM 61-120 MIN  COMPARISON:  07/19/2014  FINDINGS: Four digital C-arm fluoroscopic images submitted.  Images demonstrate placement of an IM nail with compression screw across a reduced intertrochanteric fracture of the LEFT femur.  Diffuse osseous demineralization.  Hip and knee joint alignments grossly normal.  Distal locking screw present.  No dislocation or other focal bony abnormalities identified.  IMPRESSION: Post nailing of an intertrochanteric fracture of the LEFT femur.   Electronically Signed   By: Ulyses Southward M.D.   On: 07/20/2014 09:06   Dg Femur Left  07/19/2014   CLINICAL DATA:  Fall.  Left lower extremity deformity.  EXAM: LEFT FEMUR - 2 VIEW  COMPARISON:  Left hip 07/19/2014  FINDINGS: Comminuted intertrochanteric fracture again demonstrated in the left hip with varus angulation. Mid and distal femoral shaft appears intact. Soft tissues are unremarkable.  IMPRESSION: Intertrochanteric fracture in the left hip. Left femur otherwise intact.   Electronically Signed   By: Burman Nieves M.D.   On: 07/19/2014 01:40   Ct Head Wo Contrast  07/19/2014   CLINICAL DATA:  Fall.  Confusion.  EXAM: CT HEAD WITHOUT CONTRAST  CT CERVICAL SPINE WITHOUT CONTRAST  TECHNIQUE: Multidetector CT imaging of the head and cervical spine was performed following the standard protocol without intravenous contrast. Multiplanar CT image reconstructions of the cervical spine were also generated.  COMPARISON:  CT head  and cervical spine 02/22/2014  FINDINGS: CT HEAD FINDINGS  Diffuse cerebral atrophy. Mild ventricular dilatation consistent with central atrophy. Low-attenuation change in the deep white matter consistent with small vessel ischemia. No mass effect or midline shift. No abnormal extra-axial fluid collections. Gray-white matter junctions are distinct. Basal cisterns are not effaced. No evidence of acute intracranial hemorrhage. No depressed skull fractures. Visualized paranasal sinuses and mastoid air cells are not opacified.  CT CERVICAL SPINE FINDINGS  Diffuse bone demineralization. Degenerative changes throughout the cervical spine with narrowed cervical interspaces and endplate hypertrophic changes. Degenerative changes in the facet joints. Normal alignment of the cervical spine. Normal alignment of the facet joints. No vertebral compression deformities. No prevertebral soft tissue swelling. C1-2 articulation appears intact. No focal bone lesion or bone destruction. Bone cortex and trabecular architecture appear intact. Incidental note of thoracic scoliosis with convexity towards the right.  IMPRESSION: No acute intracranial abnormalities. Chronic atrophy and small vessel ischemic changes.  Normal alignment of the cervical spine. No displaced fractures identified. Degenerative changes.   Electronically Signed   By: Burman Nieves M.D.   On: 07/19/2014 06:17   Ct Cervical Spine Wo Contrast  07/19/2014   CLINICAL DATA:  Fall.  Confusion.  EXAM: CT HEAD WITHOUT CONTRAST  CT CERVICAL SPINE WITHOUT CONTRAST  TECHNIQUE: Multidetector CT imaging of the head and cervical spine was performed following the standard protocol without intravenous contrast. Multiplanar CT image reconstructions of the cervical spine were also generated.  COMPARISON:  CT head and cervical spine 02/22/2014  FINDINGS: CT HEAD FINDINGS  Diffuse cerebral atrophy. Mild ventricular dilatation consistent with central atrophy. Low-attenuation change  in the deep white matter consistent with small vessel ischemia. No mass effect or midline shift. No  abnormal extra-axial fluid collections. Gray-white matter junctions are distinct. Basal cisterns are not effaced. No evidence of acute intracranial hemorrhage. No depressed skull fractures. Visualized paranasal sinuses and mastoid air cells are not opacified.  CT CERVICAL SPINE FINDINGS  Diffuse bone demineralization. Degenerative changes throughout the cervical spine with narrowed cervical interspaces and endplate hypertrophic changes. Degenerative changes in the facet joints. Normal alignment of the cervical spine. Normal alignment of the facet joints. No vertebral compression deformities. No prevertebral soft tissue swelling. C1-2 articulation appears intact. No focal bone lesion or bone destruction. Bone cortex and trabecular architecture appear intact. Incidental note of thoracic scoliosis with convexity towards the right.  IMPRESSION: No acute intracranial abnormalities. Chronic atrophy and small vessel ischemic changes.  Normal alignment of the cervical spine. No displaced fractures identified. Degenerative changes.   Electronically Signed   By: Burman Nieves M.D.   On: 07/19/2014 06:17   Dg C-arm 1-60 Min  07/20/2014   CLINICAL DATA:  Nailing LEFT hip  EXAM: LEFT FEMUR - 2 VIEW; DG C-ARM 61-120 MIN  COMPARISON:  07/19/2014  FINDINGS: Four digital C-arm fluoroscopic images submitted.  Images demonstrate placement of an IM nail with compression screw across a reduced intertrochanteric fracture of the LEFT femur.  Diffuse osseous demineralization.  Hip and knee joint alignments grossly normal.  Distal locking screw present.  No dislocation or other focal bony abnormalities identified.  IMPRESSION: Post nailing of an intertrochanteric fracture of the LEFT femur.   Electronically Signed   By: Ulyses Southward M.D.   On: 07/20/2014 09:06        Scheduled Meds: . [START ON 07/21/2014] aspirin EC  325 mg Oral  Q breakfast  . cefTRIAXone (ROCEPHIN)  IV  1 g Intravenous Q24H  . fentaNYL      . ferrous sulfate  325 mg Oral Q breakfast  . heparin subcutaneous  5,000 Units Subcutaneous 3 times per day  . LORazepam  0.5 mg Oral Q12H  . metoprolol  50 mg Oral Daily  . QUEtiapine  25 mg Oral BID   Continuous Infusions: . sodium chloride 50 mL/hr at 07/20/14 1241    Principal Problem:   Intertrochanteric fracture of left femur Active Problems:   Dementia   Osteoporosis   HTN (hypertension)   CAD (coronary artery disease)    Time spent: 30 minutes    Deontaye Civello, MD, FACP, FHM. [facility] Pager [phone]  If 7PM-7AM, please contact night-coverage [email or web address] Password [facility] 07/20/2014, 5:45 PM    LOS: 2 days

## 2014-07-20 NOTE — Care Management Note (Signed)
    Page 1 of 1   07/27/2014     11:22:44 AM CARE MANAGEMENT NOTE 07/27/2014  Patient:  Pamela Blackburn, Pamela Blackburn   Account Number:  [id number]  Date Initiated:  07/20/2014  Documentation initiated by:  Aariyah Sampey  Subjective/Objective Assessment:   Pt adm on 07/18/14 s/p fall with lt hip fracture.  PTA, pt resides at [facility] in memory care unit.     Action/Plan:   For surgical repair of hip fx today.  Pt will need SNF at dc.  CSW consulted to facilitate dc to SNF when medically stable for dc.   Anticipated DC Date:  07/24/2014   Anticipated DC Plan:  SKILLED NURSING FACILITY  In-house referral  Clinical Social Worker      DC Planning Services  CM consult      Choice offered to / List presented to:             Status of service:  Completed, signed off Medicare Important Message given?  YES (If response is "NO", the following Medicare IM given date fields will be blank) Date Medicare IM given:  07/21/2014 Medicare IM given by:  Bently Wyss Date Additional Medicare IM given:  07/24/2014 Additional Medicare IM given by:  Dametri Ozburn  Discharge Disposition:  SKILLED NURSING FACILITY  Per UR Regulation:  Reviewed for med. necessity/level of care/duration of stay  If discussed at Long Length of Stay Meetings, dates discussed:    Comments:  07/24/14 Sidney Ace, RN, BSN [phone] Pt discharging to SNF today, per CSW arrangements.

## 2014-07-20 NOTE — Progress Notes (Signed)
On arrival to 2w19

## 2014-07-20 NOTE — H&P (View-Only) (Signed)
ORTHOPAEDIC CONSULTATION  REQUESTING PHYSICIAN: Geradine Girt, DO  Chief Complaint: left intertroch fracture   HPI: Pamela Blackburn is a 78 y.o. female who is severly demented with alzheimers. I have no history of her fall.   Past Medical History  Diagnosis Date  . Anemia   . Osteoporosis   . Coronary artery disease   . Dementia   . Hypertension   . Leukocytosis 07/26/2012  . Hyperlipidemia 07/26/2012  . Pernicious anemia 07/26/2012  . HTN (hypertension) 07/26/2012  . CAD (coronary artery disease) 07/26/2012    Cath 12/26/05: LAD 10-15% mid stenosis and distally, LCIRC small and patent, RCA 15-20% mid stenosis. EF 45%, LV with mild anterolateral and mid inferior wall hypokinesia  Myoview : 12/13/2009: No evidence of inducible reversible ischemia EF 77%  . Anxiety 07/26/2012  . History of tobacco use 07/26/2012  . Hyperparathyroidism 07/26/2012  . Vitamin D deficiency 07/26/2012   Past Surgical History  Procedure Laterality Date  . Tonsillectomy     History   Social History  . Marital Status: Legally Separated    Spouse Name: N/A    Number of Children: N/A  . Years of Education: N/A   Social History Main Topics  . Smoking status: Former Smoker -- 0.50 packs/day    Types: Cigarettes  . Smokeless tobacco: None  . Alcohol Use: No  . Drug Use: No  . Sexual Activity:    Other Topics Concern  . None   Social History Narrative  . None   Family History  Problem Relation Age of Onset  . Breast cancer Mother   . Lung cancer Father    No Known Allergies Prior to Admission medications   Medication Sig Start Date End Date Taking? Authorizing Provider  aspirin EC 81 MG tablet Take 81 mg by mouth daily.   Yes Historical Provider, MD  ferrous sulfate 325 (65 FE) MG tablet Take 325 mg by mouth daily with breakfast.   Yes Historical Provider, MD  LORazepam (ATIVAN) 0.5 MG tablet Take 0.5 mg by mouth every 12 (twelve) hours.   Yes Historical Provider, MD  metoprolol (LOPRESSOR) 50 MG  tablet Take 50 mg by mouth daily.   Yes Historical Provider, MD  QUEtiapine (SEROQUEL) 50 MG tablet Take 25 mg by mouth 2 (two) times daily.   Yes Historical Provider, MD  vitamin B-12 (CYANOCOBALAMIN) 1000 MCG tablet Take 1,000 mcg by mouth daily.   Yes Historical Provider, MD   Dg Chest 1 View  07/19/2014   CLINICAL DATA:  Fall.  Left lower extremity deformity.  EXAM: CHEST - 1 VIEW  COMPARISON:  07/27/2012  FINDINGS: The heart size and mediastinal contours are within normal limits. Both lungs are clear. The visualized skeletal structures are unremarkable.  IMPRESSION: No active disease.   Electronically Signed   By: Lucienne Capers M.D.   On: 07/19/2014 01:39   Dg Hip Complete Left  07/19/2014   CLINICAL DATA:  Fall with left lower extremity deformity.  EXAM: LEFT HIP - COMPLETE 2+ VIEW  COMPARISON:  None.  FINDINGS: Comminuted inter trochanteric fracture of the left hip with varus angulation and displacement of lesser and greater trochanteric fragments. No evidence of dislocation of the hip. The pelvis appears intact. Postoperative changes in the right hip.  IMPRESSION: Comminuted intertrochanteric fracture of the left hip.   Electronically Signed   By: Lucienne Capers M.D.   On: 07/19/2014 01:34   Dg Femur Left  07/19/2014   CLINICAL  DATA:  Fall.  Left lower extremity deformity.  EXAM: LEFT FEMUR - 2 VIEW  COMPARISON:  Left hip 07/19/2014  FINDINGS: Comminuted intertrochanteric fracture again demonstrated in the left hip with varus angulation. Mid and distal femoral shaft appears intact. Soft tissues are unremarkable.  IMPRESSION: Intertrochanteric fracture in the left hip. Left femur otherwise intact.   Electronically Signed   By: Lucienne Capers M.D.   On: 07/19/2014 01:40   Ct Head Wo Contrast  07/19/2014   CLINICAL DATA:  Fall.  Confusion.  EXAM: CT HEAD WITHOUT CONTRAST  CT CERVICAL SPINE WITHOUT CONTRAST  TECHNIQUE: Multidetector CT imaging of the head and cervical spine was performed  following the standard protocol without intravenous contrast. Multiplanar CT image reconstructions of the cervical spine were also generated.  COMPARISON:  CT head and cervical spine 02/22/2014  FINDINGS: CT HEAD FINDINGS  Diffuse cerebral atrophy. Mild ventricular dilatation consistent with central atrophy. Low-attenuation change in the deep white matter consistent with small vessel ischemia. No mass effect or midline shift. No abnormal extra-axial fluid collections. Gray-white matter junctions are distinct. Basal cisterns are not effaced. No evidence of acute intracranial hemorrhage. No depressed skull fractures. Visualized paranasal sinuses and mastoid air cells are not opacified.  CT CERVICAL SPINE FINDINGS  Diffuse bone demineralization. Degenerative changes throughout the cervical spine with narrowed cervical interspaces and endplate hypertrophic changes. Degenerative changes in the facet joints. Normal alignment of the cervical spine. Normal alignment of the facet joints. No vertebral compression deformities. No prevertebral soft tissue swelling. C1-2 articulation appears intact. No focal bone lesion or bone destruction. Bone cortex and trabecular architecture appear intact. Incidental note of thoracic scoliosis with convexity towards the right.  IMPRESSION: No acute intracranial abnormalities. Chronic atrophy and small vessel ischemic changes.  Normal alignment of the cervical spine. No displaced fractures identified. Degenerative changes.   Electronically Signed   By: Lucienne Capers M.D.   On: 07/19/2014 06:17   Ct Cervical Spine Wo Contrast  07/19/2014   CLINICAL DATA:  Fall.  Confusion.  EXAM: CT HEAD WITHOUT CONTRAST  CT CERVICAL SPINE WITHOUT CONTRAST  TECHNIQUE: Multidetector CT imaging of the head and cervical spine was performed following the standard protocol without intravenous contrast. Multiplanar CT image reconstructions of the cervical spine were also generated.  COMPARISON:  CT head and  cervical spine 02/22/2014  FINDINGS: CT HEAD FINDINGS  Diffuse cerebral atrophy. Mild ventricular dilatation consistent with central atrophy. Low-attenuation change in the deep white matter consistent with small vessel ischemia. No mass effect or midline shift. No abnormal extra-axial fluid collections. Gray-white matter junctions are distinct. Basal cisterns are not effaced. No evidence of acute intracranial hemorrhage. No depressed skull fractures. Visualized paranasal sinuses and mastoid air cells are not opacified.  CT CERVICAL SPINE FINDINGS  Diffuse bone demineralization. Degenerative changes throughout the cervical spine with narrowed cervical interspaces and endplate hypertrophic changes. Degenerative changes in the facet joints. Normal alignment of the cervical spine. Normal alignment of the facet joints. No vertebral compression deformities. No prevertebral soft tissue swelling. C1-2 articulation appears intact. No focal bone lesion or bone destruction. Bone cortex and trabecular architecture appear intact. Incidental note of thoracic scoliosis with convexity towards the right.  IMPRESSION: No acute intracranial abnormalities. Chronic atrophy and small vessel ischemic changes.  Normal alignment of the cervical spine. No displaced fractures identified. Degenerative changes.   Electronically Signed   By: Lucienne Capers M.D.   On: 07/19/2014 06:17    Positive ROS: All other systems have  been reviewed and were otherwise negative with the exception of those mentioned in the HPI and as above.  Labs cbc  Recent Labs  07/19/14 0108  WBC 12.9*  HGB 12.5  HCT 37.5  PLT 232    Labs inflam No results found for this basename: ESR, CRP,  in the last 72 hours  Labs coag No results found for this basename: INR, PT, PTT,  in the last 72 hours   Recent Labs  07/19/14 0108  NA 141  K 4.2  CL 105  CO2 24  GLUCOSE 148*  BUN 15  CREATININE 0.73  CALCIUM 10.8*    Physical Exam: Filed  Vitals:   07/19/14 0342  BP: 101/63  Pulse:   Temp: 97.6 F (36.4 C)  Resp: 20   General: Alert, no acute distress Cardiovascular: No pedal edema Respiratory: No cyanosis, no use of accessory musculature GI: No organomegaly, abdomen is soft and non-tender Skin: No lesions in the area of chief complaint other than those listed below in MSK exam.  Neurologic: Sensation intact distally Lymphatic: No axillary or cervical lymphadenopathy  MUSCULOSKELETAL:  Wiggles toes, pain with any ROM 2+ DP Other extremities are atraumatic with painless ROM and NVI.  Assessment: Left intertroch  Plan: IM nail in OR on 8/27 pending clearance.  Hold heparin morning of 8/27  Weight Bearing Status: Bedrest then WBAT post op PT VTE px: SCD's and Aspirin post op   Edmonia Lynch, D, MD Cell [phone]   07/19/2014 7:36 AM

## 2014-07-20 NOTE — Interval H&P Note (Signed)
History and Physical Interval Note:  07/20/2014 7:12 AM  Pamela Blackburn  has presented today for surgery, with the diagnosis of IT fracture left hip  The various methods of treatment have been discussed with the patient and family. After consideration of risks, benefits and other options for treatment, the patient has consented to  Procedure(s): INTRAMEDULLARY (IM) NAIL INTERTROCHANTRIC (Left) as a surgical intervention .  The patient's history has been reviewed, patient examined, no change in status, stable for surgery.  I have reviewed the patient's chart and labs.  Questions were answered to the patient's satisfaction.     Sherial Ebrahim, D

## 2014-07-20 NOTE — Anesthesia Preprocedure Evaluation (Addendum)
Anesthesia Evaluation  Patient identified by MRN, date of birth, ID band Patient confused    Reviewed: Allergy & Precautions, H&P , NPO status , Patient's Chart, lab work & pertinent test results, Unable to perform ROS - Chart review only  History of Anesthesia Complications Negative for: history of anesthetic complications  Airway Mallampati: II TM Distance: >3 FB Neck ROM: Full    Dental  (+) Edentulous Upper, Edentulous Lower   Pulmonary neg pulmonary ROS, former smoker,  breath sounds clear to auscultation        Cardiovascular hypertension, Pt. on medications and Pt. on home beta blockers + CAD (non-obstructive by cath '07) Rhythm:Regular Rate:Normal  Myoview : 12/13/2009: No evidence of inducible reversible ischemia EF 77%   Neuro/Psych Depression dementia   GI/Hepatic negative GI ROS, Neg liver ROS,   Endo/Other  negative endocrine ROS  Renal/GU negative Renal ROS     Musculoskeletal   Abdominal   Peds  Hematology   Anesthesia Other Findings   Reproductive/Obstetrics                         Anesthesia Physical Anesthesia Plan  ASA: III  Anesthesia Plan: General   Post-op Pain Management:    Induction: Intravenous  Airway Management Planned: Oral ETT  Additional Equipment:   Intra-op Plan:   Post-operative Plan: Extubation in OR  Informed Consent: I have reviewed the patients History and Physical, chart, labs and discussed the procedure including the risks, benefits and alternatives for the proposed anesthesia with the patient or authorized representative who has indicated his/her understanding and acceptance.     Plan Discussed with: CRNA and Surgeon  Anesthesia Plan Comments: (Plan routine monitors, GETA)        Anesthesia Quick Evaluation

## 2014-07-20 NOTE — Anesthesia Postprocedure Evaluation (Signed)
  Anesthesia Post-op Note  Patient: Pamela Blackburn  Procedure(s) Performed: Procedure(s): INTRAMEDULLARY (IM) NAIL INTERTROCHANTRIC (Left)  Patient Location: PACU  Anesthesia Type:General  Level of Consciousness: awake, alert  and confused, similar to preop state  Airway and Oxygen Therapy: Patient Spontanous Breathing  Post-op Pain: none  Post-op Assessment: Post-op Vital signs reviewed, Patient's Cardiovascular Status Stable, Respiratory Function Stable, Patent Airway, No signs of Nausea or vomiting and Pain level controlled  Post-op Vital Signs: Reviewed and stable  Last Vitals:  Filed Vitals:   07/20/14 0930  BP: 130/66  Pulse: 70  Temp: 36.6 C  Resp: 16    Complications: No apparent anesthesia complications

## 2014-07-20 NOTE — Transfer of Care (Signed)
Immediate Anesthesia Transfer of Care Note  Patient: Pamela Blackburn  Procedure(s) Performed: Procedure(s): INTRAMEDULLARY (IM) NAIL INTERTROCHANTRIC (Left)  Patient Location: PACU  Anesthesia Type:General  Level of Consciousness: awake, alert , oriented and patient cooperative  Airway & Oxygen Therapy: Patient Spontanous Breathing and Patient connected to nasal cannula oxygen  Post-op Assessment: Report given to PACU RN, Post -op Vital signs reviewed and stable and Patient moving all extremities  Post vital signs: Reviewed and stable  Complications: No apparent anesthesia complications

## 2014-07-20 NOTE — Op Note (Signed)
DATE OF SURGERY:  07/20/2014  TIME: 8:30 AM  PATIENT NAME:  Pamela Blackburn  AGE: 78 y.o.  PRE-OPERATIVE DIAGNOSIS:  Intertrochanter fracture left hip  POST-OPERATIVE DIAGNOSIS:  SAME  PROCEDURE:  INTRAMEDULLARY (IM) NAIL INTERTROCHANTRIC  SURGEON:  Aliza Moret, D  ASSISTANT:  Janace Litten OPA  OPERATIVE IMPLANTS: Stryker Gamma Nail with distal interlock screw  PREOPERATIVE INDICATIONS:  Pamela Blackburn is a 78 y.o. year old who fell and suffered a hip fracture. She was brought into the ER and then admitted and optimized and then elected for surgical intervention.    The risks benefits and alternatives were discussed with the patient including but not limited to the risks of nonoperative treatment, versus surgical intervention including infection, bleeding, nerve injury, malunion, nonunion, hardware prominence, hardware failure, need for hardware removal, blood clots, cardiopulmonary complications, morbidity, mortality, among others, and they were willing to proceed.    OPERATIVE PROCEDURE:  The patient was brought to the operating room and placed in the supine position. General anesthesia was administered, with a foley. She was placed on the fracture table.  Closed reduction was performed under C-arm guidance. The length of the femur was also measured using fluoroscopy. Time out was then performed after sterile prep and drape. She received preoperative antibiotics.  Incision was made proximal to the greater trochanter. A guidewire was placed in the appropriate position. Confirmation was made on AP and lateral views. The above-named nail was opened. I opened the proximal femur with a reamer. I then placed the nail by hand easily down. I did not need to ream the femur.  Once the nail was completely seated, I placed a guidepin into the femoral head into the center center position. I measured the length, and then reamed the lateral cortex and up into the head. I then placed the lag  screw. Slight compression was applied. Anatomic fixation achieved. Bone quality was mediocre.  I then secured the proximal interlocking bolt, and took off a half a turn, and then removed the instruments, and took final C-arm pictures AP and lateral the entire length of the leg.  I then used perfect circles technique to place a distal interlock screw.   Anatomic reconstruction was achieved, and the wounds were irrigated copiously and closed with Vicryl followed by staples and sterile gauze for the skin. The patient was awakened and returned to PACU in stable and satisfactory condition. There no complications and the patient tolerated the procedure well.  She will be weightbearing as tolerated, and will be on ASA 325  for a period of four weeks after discharge.   Pamela Blackburn, M.D.

## 2014-07-20 NOTE — Discharge Instructions (Signed)
Bear weight as tolerated  Keep incision covered until follow up

## 2014-07-20 NOTE — Progress Notes (Signed)
Patient ID: Pamela Blackburn, female   DOB: [birth year], 78 y.o.   MRN: [id number]     Subjective:  Patient does not communicate but did wake with ease and follow commands  Objective:   VITALS:   Filed Vitals:   07/20/14 1000 07/20/14 1025 07/20/14 1200 07/20/14 1246  BP: 127/70   121/88  Pulse: 68   105  Temp:  98.4 F (36.9 C)  97.9 F (36.6 C)  TempSrc:    Oral  Resp:   18 16  Weight:      SpO2: 100%   97%    ABD soft Sensation intact distally Dorsiflexion/Plantar flexion intact Incision: dressing C/D/I and no drainage   Lab Results  Component Value Date   WBC 12.9* 07/19/2014   HGB 12.5 07/19/2014   HCT 37.5 07/19/2014   MCV 86.8 07/19/2014   PLT 232 07/19/2014     Assessment/Plan: Day of Surgery   Principal Problem:   Intertrochanteric fracture of left femur Active Problems:   Dementia   Osteoporosis   HTN (hypertension)   CAD (coronary artery disease)   Advance diet Up with therapy Continue plan per medicine WBAT Dry dressing PRN   Haskel Khan 07/20/2014, 2:09 PM   Margarita Rana MD [phone]

## 2014-07-20 NOTE — Clinical Social Work Note (Signed)
Clinical Social Worker continuing to follow patient and family for support and discharge planning needs.  CSW spoke with patient husband over the phone to provide bed offers.  PT/OT evaluations to be completed tomorrow to submit for insurance authorization.  Patient husband to provide CSW with bed choice tomorrow.  CSW remains available for support and to facilitate patient discharge needs once medically ready.  Macario Golds, [place] [phone]

## 2014-07-20 NOTE — Anesthesia Procedure Notes (Signed)
Procedure Name: Intubation Date/Time: 07/20/2014 7:30 AM Performed by: Jerilee Hoh Pre-anesthesia Checklist: Patient identified, Emergency Drugs available, Suction available and Patient being monitored Patient Re-evaluated:Patient Re-evaluated prior to inductionOxygen Delivery Method: Circle system utilized Preoxygenation: Pre-oxygenation with 100% oxygen Intubation Type: IV induction Ventilation: Mask ventilation without difficulty Laryngoscope Size: Mac and 3 Grade View: Grade I Tube type: Oral Tube size: 7.5 mm Number of attempts: 1 Airway Equipment and Method: Stylet Placement Confirmation: ETT inserted through vocal cords under direct vision,  positive ETCO2 and breath sounds checked- equal and bilateral Secured at: 22 cm Tube secured with: Tape Dental Injury: Teeth and Oropharynx as per pre-operative assessment

## 2014-07-21 ENCOUNTER — Encounter (HOSPITAL_COMMUNITY): Payer: Self-pay | Admitting: Orthopedic Surgery

## 2014-07-21 DIAGNOSIS — E876 Hypokalemia: Secondary | ICD-10-CM | POA: Diagnosis not present

## 2014-07-21 DIAGNOSIS — N390 Urinary tract infection, site not specified: Secondary | ICD-10-CM | POA: Diagnosis present

## 2014-07-21 DIAGNOSIS — D62 Acute posthemorrhagic anemia: Secondary | ICD-10-CM

## 2014-07-21 DIAGNOSIS — D649 Anemia, unspecified: Secondary | ICD-10-CM | POA: Diagnosis not present

## 2014-07-21 LAB — BASIC METABOLIC PANEL
ANION GAP: 8 (ref 5–15)
BUN: 8 mg/dL (ref 6–23)
CHLORIDE: 105 meq/L (ref 96–112)
CO2: 27 mEq/L (ref 19–32)
CREATININE: 0.61 mg/dL (ref 0.50–1.10)
Calcium: 9.8 mg/dL (ref 8.4–10.5)
GFR calc Af Amer: >90 mL/min (ref >90)
GFR calc non Af Amer: 87 mL/min — ABNORMAL LOW (ref >90)
Glucose, Bld: 115 mg/dL — ABNORMAL HIGH (ref 70–99)
Potassium: 3.6 mEq/L — ABNORMAL LOW (ref 3.7–5.3)
Sodium: 140 mEq/L (ref 137–147)

## 2014-07-21 LAB — CBC
HCT: 26.7 % — ABNORMAL LOW (ref 36.0–46.0)
Hemoglobin: 9 g/dL — ABNORMAL LOW (ref 12.0–15.0)
MCH: 29.5 pg (ref 26.0–34.0)
MCHC: 33.7 g/dL (ref 30.0–36.0)
MCV: 87.5 fL (ref 78.0–100.0)
PLATELETS: 180 10*3/uL (ref 150–400)
RBC: 3.05 MIL/uL — ABNORMAL LOW (ref 3.87–5.11)
RDW: 14.5 % (ref 11.5–15.5)
WBC: 6.5 10*3/uL (ref 4.0–10.5)

## 2014-07-21 MED ORDER — BISACODYL 10 MG RE SUPP
10.0000 mg | Freq: Every day | RECTAL | Status: DC | PRN
  Filled 2014-07-21: qty 1

## 2014-07-21 MED ORDER — POTASSIUM CHLORIDE CRYS ER 20 MEQ PO TBCR
40.0000 meq | EXTENDED_RELEASE_TABLET | Freq: Once | ORAL | Status: AC
  Administered 2014-07-21: 40 meq via ORAL
  Filled 2014-07-21: qty 2

## 2014-07-21 MED ORDER — CEFUROXIME AXETIL 500 MG PO TABS
500.0000 mg | ORAL_TABLET | Freq: Two times a day (BID) | ORAL | Status: DC
  Administered 2014-07-21 – 2014-07-24 (×6): 500 mg via ORAL
  Filled 2014-07-21 (×8): qty 1

## 2014-07-21 MED ORDER — FLEET ENEMA 7-19 GM/118ML RE ENEM: 1.0000 | ENEMA | Freq: Every day | RECTAL | Status: DC | PRN

## 2014-07-21 MED ORDER — SENNOSIDES-DOCUSATE SODIUM 8.6-50 MG PO TABS
2.0000 | ORAL_TABLET | Freq: Every day | ORAL | Status: DC
  Administered 2014-07-21 – 2014-07-23 (×3): 2 via ORAL
  Filled 2014-07-21 (×4): qty 2

## 2014-07-21 NOTE — Evaluation (Signed)
Physical Therapy Evaluation Patient Details Name: Pamela Blackburn MRN: [id number] DOB: 16-Feb-1936 Today's Date: 07/21/2014   History of Present Illness  This 78 y.o. female with h/o severe dementia admitted from memory care facility after being found on the floor in the bathroom indicating Lt. hip pain.  xray showed Lt. interotrochanteric hip fracture.  Underwent IM nailing.  Pt is WBAT  Clinical Impression  Pt admitted with above. Pt currently with functional limitations due to the deficits listed below (see PT Problem List).  Pt will benefit from skilled PT to increase their independence and safety with mobility to allow discharge to the venue listed below.     Follow Up Recommendations SNF;Supervision/Assistance - 24 hour    Equipment Recommendations  None recommended by PT    Recommendations for Other Services       Precautions / Restrictions Precautions Precautions: Fall Precaution Comments: severe dementia Restrictions Weight Bearing Restrictions: Yes LLE Weight Bearing: Weight bearing as tolerated      Mobility  Bed Mobility Overal bed mobility: Needs Assistance;+2 for physical assistance Bed Mobility: Rolling;Supine to Sit Rolling: Max assist   Supine to sit: Mod assist     General bed mobility comments: Pt able to get to EOB with pain with movement but was able to work through it.   Transfers Overall transfer level: Needs assistance   Transfers: Sit to/from [employer] Transfers Sit to Stand: Max assist;+2 physical assistance   Squat pivot transfers: Max assist;+2 physical assistance     General transfer comment: Pt was able to perform squat pivot with 2 person assist and gait belt.  Had to have assist to hold at buttock to achieve partial stand.  Pt did weight bear a little on left LE and tolerated for transfer.  Once in chair, smiling and happy.    Ambulation/Gait                Stairs            Wheelchair Mobility    Modified  Rankin (Stroke Patients Only)       Balance Overall balance assessment: Needs assistance;History of Falls Sitting-balance support: No upper extremity supported;Feet supported Sitting balance-Leahy Scale: Fair Sitting balance - Comments: Was able to sit EOB for 10 minutes without assist.    Standing balance support: Bilateral upper extremity supported;During functional activity Standing balance-Leahy Scale: Poor Standing balance comment: Needs assist.                             Pertinent Vitals/Pain Pain Assessment: Faces Faces Pain Scale: Hurts whole lot Pain Location: left hip with movement Pain Descriptors / Indicators: Moaning Pain Intervention(s): Limited activity within patient's tolerance;Monitored during session;Premedicated before session;Repositioned VSS    Home Living Family/patient expects to be discharged to:: Skilled nursing facility                      Prior Function Level of Independence: Needs assistance   Gait / Transfers Assistance Needed: Supervision  ADL's / Homemaking Assistance Needed: RN spoke with staff at facility where she resided.  She required extensive assistance with BADLs  Comments: Per chart, pt was a Careers information officer" on memory unit     Hand Dominance   Dominant Hand:  (unknown)    Extremity/Trunk Assessment   Upper Extremity Assessment: Defer to OT evaluation           Lower Extremity Assessment: LLE deficits/detail  Communication   Communication: Expressive difficulties  Cognition Arousal/Alertness: Awake/alert Behavior During Therapy: Restless Overall Cognitive Status: No family/caregiver present to determine baseline cognitive functioning (Pt with h/o severe dementia)                      General Comments      Exercises General Exercises - Lower Extremity Ankle Circles/Pumps: AAROM;Both;5 reps;Supine Heel Slides: AAROM;Both;5 reps;Supine      Assessment/Plan    PT Assessment  Patient needs continued PT services  PT Diagnosis Generalized weakness;Acute pain   PT Problem List Decreased activity tolerance;Decreased balance;Decreased strength;Decreased mobility;Decreased knowledge of use of DME;Decreased safety awareness;Decreased knowledge of precautions;Pain  PT Treatment Interventions DME instruction;Gait training;Functional mobility training;Therapeutic activities;Therapeutic exercise;Balance training;Patient/family education   PT Goals (Current goals can be found in the Care Plan section) Acute Rehab PT Goals Patient Stated Goal: unable to state PT Goal Formulation: With patient Time For Goal Achievement: 08/04/14 Potential to Achieve Goals: Good    Frequency Min 3X/week   Barriers to discharge        Co-evaluation               End of Session Equipment Utilized During Treatment: Gait belt Activity Tolerance: Patient limited by fatigue;Patient limited by pain Patient left: in chair;with call bell/phone within reach;with chair alarm set Nurse Communication: Mobility status;Need for lift equipment;Weight bearing status         Time: 1102-1120 PT Time Calculation (min): 18 min   Charges:   PT Evaluation $Initial PT Evaluation Tier I: 1 Procedure PT Treatments $Therapeutic Activity: 8-22 mins   PT G Codes:          INGOLD,Pamela Blackburn 12-Aug-2014, 1:40 PM [facility] [phone] (pager)

## 2014-07-21 NOTE — Progress Notes (Signed)
Patient ID: Pamela Blackburn, female   DOB: November 20, 1936, 78 y.o.   MRN: [id number]     Subjective:   Patient unable to report and is non verbal.  Does open eyes but is having trouble following commands  Objective:   VITALS:   Filed Vitals:   07/20/14 2031 07/21/14 0454 07/21/14 0800 07/21/14 1039  BP: 127/114 131/72  102/52  Pulse: 90 99  98  Temp: 98.2 F (36.8 C) 97.5 F (36.4 C)    TempSrc: Axillary Axillary    Resp: Weight:      SpO2: 100% 98% 98%     ABD soft Sensation intact distally Dorsiflexion/Plantar flexion intact Incision: dressing C/D/I and scant drainage   Lab Results  Component Value Date   WBC 6.5 07/21/2014   HGB 9.0* 07/21/2014   HCT 26.7* 07/21/2014   MCV 87.5 07/21/2014   PLT 180 07/21/2014     Assessment/Plan: 1 Day Post-Op   Principal Problem:   Intertrochanteric fracture of left femur Active Problems:   Dementia   Osteoporosis   HTN (hypertension)   CAD (coronary artery disease)   Advance diet Up with therapy Continue plan per medicine WBAGILMA BESSETTEing PRN   Haskel Khan 07/21/2014, 11:15 AM   Margarita Rana MD [phone]

## 2014-07-21 NOTE — Progress Notes (Addendum)
PROGRESS NOTE    Pamela Blackburn WJX:[id number] DOB: 02/22/1936 DOA: 07/18/2014 PCP: Kaleen Mask, MD  HPI/Brief narrative 78 year old female with history of osteoporosis, dementia, hypertension, CAD, presented from nursing home following an unwitnessed fall and right hip pain. She was found to have intertrochanteric fracture of left femur and underwent surgical fixation 8/27.  Assessment/Plan:  1. Intertrochanteric fracture of left femur: Sustained following mechanical fall. Orthopedics performed ORIF/IM nailing on 07/20/14. Management per orthopedics who recommend weight bearing as tolerated and aspirin 325 mg daily for DVT prophylaxis for 4 weeks post procedure. Will need SNF. 2. History of advanced dementia: Mental status at baseline. 3. UTI: change to ceftin 4. Mechanical fall: No acute findings on CT head or cervical spine. 5. Hypertension: Controlled. Continue metoprolol. 6. Bowel regimen Anemia: repeat CBC in am Hypokalemia: replete.  D/c telemetry  Code Status: Full Family Communication: None at bedside Disposition Plan: SNF likely this weekend if stable   Consultants:  Orthopedics  Procedures:  ORIF left hip 8/27  Foley catheter  Antibiotics:  Rocephin- DC'ed  Subjective: unable  Objective: Filed Vitals:   07/21/14 0454 07/21/14 0800 07/21/14 1039 07/21/14 1449  BP: 131/72  102/52 122/96  Pulse: 99  98 93  Temp: 97.5 F (36.4 C)   98.3 F (36.8 C)  TempSrc: Axillary   Oral  Resp: Weight:      SpO2: 98% 98%  98%    Intake/Output Summary (Last 24 hours) at 07/21/14 1732 Last data filed at 07/21/14 1300  Gross per 24 hour  Intake    942 ml  Output    850 ml  Net     92 ml   Filed Weights   07/19/14 0342  Weight: 54.8 kg (120 lb 13 oz)     Exam:  General exam: confused.  Respiratory system: CTA without WRR. Cardiovascular system: NSR without wRR Gastrointestinal system: Abdomen is nondistended, soft and nontender.  Normal bowel sounds heard. Extremities: Symmetric 5 x 5 power. Left hip surgical site clean, dry.   Data Reviewed: Basic Metabolic Panel:  Recent Labs Lab 07/19/14 0108 07/21/14 0545  NA 141 140  K 4.2 3.6*  CL 105 105  CO2 24 27  GLUCOSE 148* 115*  BUN 15 8  CREATININE 0.73 0.61  CALCIUM 10.8* 9.8   Liver Function Tests:  Recent Labs Lab 07/19/14 0108  AST 21  ALT 12  ALKPHOS 149*  BILITOT 0.3  PROT 7.4  ALBUMIN 3.7    Recent Labs Lab 07/19/14 0108  LIPASE 38   No results found for this basename: AMMONIA,  in the last 168 hours CBC:  Recent Labs Lab 07/19/14 0108 07/21/14 0545  WBC 12.9* 6.5  NEUTROABS 11.0*  --   HGB 12.5 9.0*  HCT 37.5 26.7*  MCV 86.8 87.5  PLT 232 180   Cardiac Enzymes:  Recent Labs Lab 07/19/14 0108  CKTOTAL 111   BNP (last 3 results) No results found for this basename: PROBNP,  in the last 8760 hours CBG: No results found for this basename: GLUCAP,  in the last 168 hours  Recent Results (from the past 240 hour(s))  MRSA PCR SCREENING     Status: None   Collection Time    07/19/14  3:50 AM      Result Value Ref Range Status   MRSA by PCR NEGATIVE  NEGATIVE Final   Comment:            The GeneXpert MRSA  Assay (FDA     approved for NASAL specimens     only), is one component of a     comprehensive MRSA colonization     surveillance program. It is not     intended to diagnose MRSA     infection nor to guide or     monitor treatment for     MRSA infections.  URINE CULTURE     Status: None   Collection Time    07/19/14 12:46 PM      Result Value Ref Range Status   Specimen Description URINE, CATHETERIZED   Final   Special Requests NONE   Final   Culture  Setup Time     Final   Value: 07/19/2014 16:07     Performed at [facility] Count     Final   Value: NO GROWTH     Performed at [facility]   Culture     Final   Value: NO GROWTH     Performed at [facility]   Report  Status 07/20/2014 FINAL   Final  SURGICAL PCR SCREEN     Status: None   Collection Time    07/20/14 12:02 AM      Result Value Ref Range Status   MRSA, PCR NEGATIVE  NEGATIVE Final   Staphylococcus aureus NEGATIVE  NEGATIVE Final   Comment:            The Xpert SA Assay (FDA     approved for NASAL specimens     in patients over 55 years of age),     is one component of     a comprehensive surveillance     program.  Test performance has     been validated by [employer] for patients greater     than or equal to 52 year old.     It is not intended     to diagnose infection nor to     guide or monitor treatment.        Studies: Dg Femur Left  07/20/2014   CLINICAL DATA:  Intertrochanteric fracture of the proximal left femur.  EXAM: LEFT FEMUR - 2 VIEW  COMPARISON:  Radiographs dated 07/19/2014  FINDINGS: Intra medullary nail has been inserted as well as a lag screw. Distal fixation screw has been inserted. Alignment and position of the fracture is near anatomic.  IMPRESSION: Open reduction and internal fixation of intertrochanteric fracture of the proximal left femur.   Electronically Signed   By: Geanie Cooley M.D.   On: 07/20/2014 16:14   Dg Femur Left  07/20/2014   CLINICAL DATA:  Nailing LEFT hip  EXAM: LEFT FEMUR - 2 VIEW; DG C-ARM 61-120 MIN  COMPARISON:  07/19/2014  FINDINGS: Four digital C-arm fluoroscopic images submitted.  Images demonstrate placement of an IM nail with compression screw across a reduced intertrochanteric fracture of the LEFT femur.  Diffuse osseous demineralization.  Hip and knee joint alignments grossly normal.  Distal locking screw present.  No dislocation or other focal bony abnormalities identified.  IMPRESSION: Post nailing of an intertrochanteric fracture of the LEFT femur.   Electronically Signed   By: Ulyses Southward M.D.   On: 07/20/2014 09:06   Dg C-arm 1-60 Min  07/20/2014   CLINICAL DATA:  Nailing LEFT hip  EXAM: LEFT FEMUR - 2 VIEW; DG C-ARM  61-120 MIN  COMPARISON:  07/19/2014  FINDINGS: Four digital C-arm fluoroscopic  images submitted.  Images demonstrate placement of an IM nail with compression screw across a reduced intertrochanteric fracture of the LEFT femur.  Diffuse osseous demineralization.  Hip and knee joint alignments grossly normal.  Distal locking screw present.  No dislocation or other focal bony abnormalities identified.  IMPRESSION: Post nailing of an intertrochanteric fracture of the LEFT femur.   Electronically Signed   By: Ulyses Southward M.D.   On: 07/20/2014 09:06        Scheduled Meds: . aspirin EC  325 mg Oral Q breakfast  . ferrous sulfate  325 mg Oral Q breakfast  . heparin subcutaneous  5,000 Units Subcutaneous 3 times per day  . LORazepam  0.5 mg Oral Q12H  . metoprolol  50 mg Oral Daily  . QUEtiapine  25 mg Oral BID   Continuous Infusions: . sodium chloride 50 mL/hr at 07/21/14 1038    Principal Problem:   Intertrochanteric fracture of left femur Active Problems:   Dementia   Osteoporosis   HTN (hypertension)   CAD (coronary artery disease)    Time spent: 30 minutes    Christiane Ha, MD, [facility] Pager [phone]  If 7PM-7AM, please contact night-coverage [email or web address] Password [facility] 07/21/2014, 5:32 PM    LOS: 3 days

## 2014-07-21 NOTE — Progress Notes (Signed)
Discontinued foley catheter per order. Removed 9cc of water from balloon. Pt tolerated well. Awaiting for pt to void. Monitoring will continue.

## 2014-07-21 NOTE — Clinical Social Work Note (Signed)
Clinical Social Worker continuing to follow patient and family for support and discharge planning needs.  CSW spoke with patient husband at bedside, who states that he has chosen [facility].  CSW updated facility liaison who plans to submit clinicals for insurance authorization today.  CSW to await confirmation from facility that patient can discharge over the weekend.  CSW remains available for support and to facilitate patient discharge needs once medically ready.  Macario Golds, [place] [phone]

## 2014-07-22 LAB — CBC
HCT: 23.7 % — ABNORMAL LOW (ref 36.0–46.0)
HEMOGLOBIN: 7.8 g/dL — AB (ref 12.0–15.0)
MCH: 28.3 pg (ref 26.0–34.0)
MCHC: 32.9 g/dL (ref 30.0–36.0)
MCV: 85.9 fL (ref 78.0–100.0)
Platelets: 184 10*3/uL (ref 150–400)
RBC: 2.76 MIL/uL — ABNORMAL LOW (ref 3.87–5.11)
RDW: 14.6 % (ref 11.5–15.5)
WBC: 5.7 10*3/uL (ref 4.0–10.5)

## 2014-07-22 LAB — BASIC METABOLIC PANEL
ANION GAP: 9 (ref 5–15)
BUN: 9 mg/dL (ref 6–23)
CALCIUM: 9.8 mg/dL (ref 8.4–10.5)
CHLORIDE: 103 meq/L (ref 96–112)
CO2: 25 mEq/L (ref 19–32)
CREATININE: 0.59 mg/dL (ref 0.50–1.10)
GFR, EST NON AFRICAN AMERICAN: 87 mL/min — AB (ref >90)
Glucose, Bld: 118 mg/dL — ABNORMAL HIGH (ref 70–99)
Potassium: 4 mEq/L (ref 3.7–5.3)
Sodium: 137 mEq/L (ref 137–147)

## 2014-07-22 LAB — PREPARE RBC (CROSSMATCH)

## 2014-07-22 MED ORDER — POLYETHYLENE GLYCOL 3350 17 G PO PACK
17.0000 g | PACK | Freq: Every day | ORAL | Status: DC
  Administered 2014-07-22 – 2014-07-24 (×3): 17 g via ORAL
  Filled 2014-07-22 (×3): qty 1

## 2014-07-22 MED ORDER — SODIUM CHLORIDE 0.9 % IV SOLN
Freq: Once | INTRAVENOUS | Status: AC
  Administered 2014-07-22: 09:00:00 via INTRAVENOUS

## 2014-07-22 NOTE — Progress Notes (Signed)
Physical Therapy Treatment Patient Details Name: Pamela Blackburn MRN: [id number] DOB: 11/27/1935 Today's Date: 07/22/2014    History of Present Illness This 78 y.o. female with h/o severe dementia admitted from memory care facility after being found on the floor in the bathroom indicating Lt. hip pain.  xray showed Lt. interotrochanteric hip fracture.  Underwent IM nailing.  Pt is WBAT    PT Comments    Pt asleep upon arrival but arousable.  Does not follow commands.  Requires +2 for mobility.   Pt does not state pain but moans & facial grimmacing with all movement.   Follow Up Recommendations  SNF;Supervision/Assistance - 24 hour     Equipment Recommendations  None recommended by PT    Recommendations for Other Services       Precautions / Restrictions Precautions Precautions: Fall Precaution Comments: severe dementia Restrictions Weight Bearing Restrictions: Yes LLE Weight Bearing: Weight bearing as tolerated    Mobility  Bed Mobility Overal bed mobility: +2 for physical assistance Bed Mobility: Rolling;Supine to Sit Rolling: Max assist   Supine to sit: +2 for physical assistance     General bed mobility comments: Pt not following any commands/cues.  Required total assist for all components of transitional movements  Transfers Overall transfer level: Needs assistance Equipment used: None Transfers: Sit to/from Stand;Stand Pivot Transfers Sit to Stand: +2 physical assistance;Max assist Stand pivot transfers: +2 physical assistance;Max assist       General transfer comment: cues for technique with pt unable to follow any cues.  use of draw pad under pts hips to lift, rotate, & position in recliner.    Ambulation/Gait                 Stairs            Wheelchair Mobility    Modified Rankin (Stroke Patients Only)       Balance                                    Cognition Arousal/Alertness: Awake/alert   Overall Cognitive  Status: No family/caregiver present to determine baseline cognitive functioning                      Exercises      General Comments        Pertinent Vitals/Pain Pain Assessment: Faces Faces Pain Scale: Hurts whole lot Pain Location: Lt hip with movement Pain Descriptors / Indicators: Moaning Pain Intervention(s): Limited activity within patient's tolerance;Monitored during session;Repositioned    Home Living                      Prior Function            PT Goals (current goals can now be found in the care plan section) Acute Rehab PT Goals Patient Stated Goal: unable to state PT Goal Formulation: With patient Time For Goal Achievement: 08/04/14 Potential to Achieve Goals: Good Progress towards PT goals: Not progressing toward goals - comment    Frequency  Min 3X/week    PT Plan Current plan remains appropriate    Co-evaluation             End of Session Equipment Utilized During Treatment: Gait belt Activity Tolerance: Patient limited by pain;Other (comment) (limited by cognitive deficits) Patient left: in chair;with call bell/phone within reach;with chair alarm set     Time:  1610-9604 PT Time Calculation (min): 23 min  Charges:  $Therapeutic Activity: 23-37 mins                    G Codes:      Lara Mulch 07/22/2014, 12:00 PM   Verdell Face, PTA [phone] 07/22/2014

## 2014-07-22 NOTE — Progress Notes (Addendum)
PROGRESS NOTE    CLYDE UPSHAW ZOX:[id number] DOB: [birth year] DOA: 07/18/2014 PCP: Kaleen Mask, MD  HPI/Brief narrative 78 year old female with history of osteoporosis, dementia, hypertension, CAD, presented from nursing home following an unwitnessed fall and right hip pain. She was found to have intertrochanteric fracture of left femur and underwent surgical fixation 8/27.  Assessment/Plan:  1. Intertrochanteric fracture of left femur: Sustained following mechanical fall. Orthopedics performed ORIF/IM nailing on 07/20/14. Management per orthopedics who recommend weight bearing as tolerated and aspirin 325 mg daily for DVT prophylaxis for 4 weeks post procedure. Will need SNF. 2. History of advanced dementia: Mental status at baseline. 3. UTI: change to ceftin 4. Mechanical fall: No acute findings on CT head or cervical spine. 5. Hypertension: Controlled. Continue metoprolol. 6. Bowel regimen Anemia: hgb drifting down. 12 on admission. 7.8 today. No reported bleeding. Has ecchymoses on hip. Will give 1 unit PRBC. Obtained informed consent with husband Hypokalemia: corrected   Code Status: Full Family Communication: husband, Alfredo Bach by phone Disposition Plan: SNF when anemia stabilizes   Consultants:  Orthopedics  Procedures:  ORIF left hip 8/27  Foley catheter  Antibiotics:  Rocephin- DC'ed  Subjective: unable  Objective: Filed Vitals:   07/21/14 2118 07/22/14 0000 07/22/14 0400 07/22/14 0533  BP: 102/53   111/61  Pulse: 108   101  Temp: 98.6 F (37 C)   98.4 F (36.9 C)  TempSrc: Axillary   Oral  Resp: Weight:      SpO2: 98%   97%    Intake/Output Summary (Last 24 hours) at 07/22/14 0824 Last data filed at 07/21/14 1900  Gross per 24 hour  Intake    690 ml  Output    350 ml  Net    340 ml   Filed Weights   07/19/14 0342  Weight: 54.8 kg (120 lb 13 oz)     Exam:  General exam: confused.  Respiratory system: CTA without  WRR. Cardiovascular system: NSR without MGR Gastrointestinal system: Abdomen is nondistended, soft and nontender. Normal bowel sounds heard. Extremities: no pretibial edema. Incision with dried blood. Dressing without drainage. Ecchymoses posterior thigh   Data Reviewed: Basic Metabolic Panel:  Recent Labs Lab 07/19/14 0108 07/21/14 0545 07/22/14 0410  NA 141 140 137  K 4.2 3.6* 4.0  CL 105 105 103  CO2 GLUCOSE 148* 115* 118*  BUN CREATININE 0.73 0.61 0.59  CALCIUM 10.8* 9.8 9.8   Liver Function Tests:  Recent Labs Lab 07/19/14 0108  AST 21  ALT 12  ALKPHOS 149*  BILITOT 0.3  PROT 7.4  ALBUMIN 3.7    Recent Labs Lab 07/19/14 0108  LIPASE 38   No results found for this basename: AMMONIA,  in the last 168 hours CBC:  Recent Labs Lab 07/19/14 0108 07/21/14 0545 07/22/14 0410  WBC 12.9* 6.5 5.7  NEUTROABS 11.0*  --   --   HGB 12.5 9.0* 7.8*  HCT 37.5 26.7* 23.7*  MCV 86.8 87.5 85.9  PLT 232 180 184   Cardiac Enzymes:  Recent Labs Lab 07/19/14 0108  CKTOTAL 111   BNP (last 3 results) No results found for this basename: PROBNP,  in the last 8760 hours CBG: No results found for this basename: GLUCAP,  in the last 168 hours  Recent Results (from the past 240 hour(s))  MRSA PCR SCREENING     Status: None   Collection Time    07/19/14  3:50 AM      Result Value Ref Range Status   MRSA by PCR NEGATIVE  NEGATIVE Final   Comment:            The GeneXpert MRSA Assay (FDA     approved for NASAL specimens     only), is one component of a     comprehensive MRSA colonization     surveillance program. It is not     intended to diagnose MRSA     infection nor to guide or     monitor treatment for     MRSA infections.  URINE CULTURE     Status: None   Collection Time    07/19/14 12:46 PM      Result Value Ref Range Status   Specimen Description URINE, CATHETERIZED   Final   Special Requests NONE   Final   Culture  Setup Time      Final   Value: 07/19/2014 16:07     Performed at [facility] Count     Final   Value: NO GROWTH     Performed at [facility]   Culture     Final   Value: NO GROWTH     Performed at [facility]   Report Status 07/20/2014 FINAL   Final  SURGICAL PCR SCREEN     Status: None   Collection Time    07/20/14 12:02 AM      Result Value Ref Range Status   MRSA, PCR NEGATIVE  NEGATIVE Final   Staphylococcus aureus NEGATIVE  NEGATIVE Final   Comment:            The Xpert SA Assay (FDA     approved for NASAL specimens     in patients over 6 years of age),     is one component of     a comprehensive surveillance     program.  Test performance has     been validated by [employer] for patients greater     than or equal to 70 year old.     It is not intended     to diagnose infection nor to     guide or monitor treatment.        Studies: Dg Femur Left  07/20/2014   CLINICAL DATA:  Intertrochanteric fracture of the proximal left femur.  EXAM: LEFT FEMUR - 2 VIEW  COMPARISON:  Radiographs dated 07/19/2014  FINDINGS: Intra medullary nail has been inserted as well as a lag screw. Distal fixation screw has been inserted. Alignment and position of the fracture is near anatomic.  IMPRESSION: Open reduction and internal fixation of intertrochanteric fracture of the proximal left femur.   Electronically Signed   By: Geanie Cooley M.D.   On: 07/20/2014 16:14   Dg Femur Left  07/20/2014   CLINICAL DATA:  Nailing LEFT hip  EXAM: LEFT FEMUR - 2 VIEW; DG C-ARM 61-120 MIN  COMPARISON:  07/19/2014  FINDINGS: Four digital C-arm fluoroscopic images submitted.  Images demonstrate placement of an IM nail with compression screw across a reduced intertrochanteric fracture of the LEFT femur.  Diffuse osseous demineralization.  Hip and knee joint alignments grossly normal.  Distal locking screw present.  No dislocation or other focal bony abnormalities identified.  IMPRESSION:  Post nailing of an intertrochanteric fracture of the LEFT femur.   Electronically Signed   By: Ulyses Southward M.D.   On: 07/20/2014  09:06   Dg C-arm 1-60 Min  07/20/2014   CLINICAL DATA:  Nailing LEFT hip  EXAM: LEFT FEMUR - 2 VIEW; DG C-ARM 61-120 MIN  COMPARISON:  07/19/2014  FINDINGS: Four digital C-arm fluoroscopic images submitted.  Images demonstrate placement of an IM nail with compression screw across a reduced intertrochanteric fracture of the LEFT femur.  Diffuse osseous demineralization.  Hip and knee joint alignments grossly normal.  Distal locking screw present.  No dislocation or other focal bony abnormalities identified.  IMPRESSION: Post nailing of an intertrochanteric fracture of the LEFT femur.   Electronically Signed   By: Ulyses Southward M.D.   On: 07/20/2014 09:06        Scheduled Meds: . aspirin EC  325 mg Oral Q breakfast  . cefUROXime  500 mg Oral BID WC  . ferrous sulfate  325 mg Oral Q breakfast  . heparin subcutaneous  5,000 Units Subcutaneous 3 times per day  . LORazepam  0.5 mg Oral Q12H  . metoprolol  50 mg Oral Daily  . QUEtiapine  25 mg Oral BID  . senna-docusate  2 tablet Oral QHS   Continuous Infusions:    Principal Problem:   Intertrochanteric fracture of left femur Active Problems:   Dementia   Osteoporosis   HTN (hypertension)   CAD (coronary artery disease)   Anemia   Hypokalemia   UTI (urinary tract infection)    Time spent: 30 minutes    Christiane Ha, MD, [facility] Pager [phone]  If 7PM-7AM, please contact night-coverage [email or web address] Password [email or web address] 07/22/2014, 8:24 AM    LOS: 4 days

## 2014-07-22 NOTE — Progress Notes (Signed)
Subjective: 2 Days Post-Op Procedure(s) (LRB): INTRAMEDULLARY (IM) NAIL INTERTROCHANTRIC (Left) Patient appears comfortable without pain.  Open eyes but difficulty following commands.    Objective: Vital signs in last 24 hours: Temp:  [98.3 F (36.8 C)-98.6 F (37 C)] 98.4 F (36.9 C) (08/29 0533) Pulse Rate:  [93-108] 101 (08/29 0533) Resp:  [16-17] 17 (08/29 0533) BP: (102-122)/(52-96) 111/61 mmHg (08/29 0533) SpO2:  [97 %-98 %] 97 % (08/29 0533)  Intake/Output from previous day: 08/28 0701 - 08/29 0700 In: 690 [P.O.:690] Out: 350 [Urine:350] Intake/Output this shift:     Recent Labs  07/21/14 0545 07/22/14 0410  HGB 9.0* 7.8*    Recent Labs  07/21/14 0545 07/22/14 0410  WBC 6.5 5.7  RBC 3.05* 2.76*  HCT 26.7* 23.7*  PLT 180 184    Recent Labs  07/21/14 0545 07/22/14 0410  NA 140 137  K 3.6* 4.0  CL 105 103  CO2 27 25  BUN 8 9  CREATININE 0.61 0.59  GLUCOSE 115* 118*  CALCIUM 9.8 9.8   No results found for this basename: LABPT, INR,  in the last 72 hours  Intact pulses distally Incision: dressing C/D/I and scant drainage No cellulitis present  Assessment/Plan: 2 Days Post-Op Procedure(s) (LRB): INTRAMEDULLARY (IM) NAIL INTERTROCHANTRIC (Left) Up with therapy  Continue plan per medicine  WBAT  Dry dressing PRN   Pamela Blackburn, Pamela Blackburn 07/22/2014, 9:13 AM

## 2014-07-23 LAB — TYPE AND SCREEN
ABO/RH(D): B NEG
Antibody Screen: NEGATIVE
Unit division: 0

## 2014-07-23 LAB — HEMOGLOBIN AND HEMATOCRIT, BLOOD
HCT: 31.1 % — ABNORMAL LOW (ref 36.0–46.0)
Hemoglobin: 10.4 g/dL — ABNORMAL LOW (ref 12.0–15.0)

## 2014-07-23 MED ORDER — FERROUS SULFATE 300 (60 FE) MG/5ML PO SYRP
300.0000 mg | ORAL_SOLUTION | Freq: Every day | ORAL | Status: DC
  Administered 2014-07-23 – 2014-07-24 (×2): 300 mg via ORAL
  Filled 2014-07-23 (×4): qty 5

## 2014-07-23 NOTE — Progress Notes (Signed)
PROGRESS NOTE    Pamela Blackburn:[id number] DOB: 1936-04-07 DOA: 07/18/2014 PCP: Kaleen Mask, MD  HPI/Brief narrative 78 year old female with history of osteoporosis, dementia, hypertension, CAD, presented from nursing home following an unwitnessed fall and right hip pain. She was found to have intertrochanteric fracture of left femur and underwent surgical fixation 8/27.  Assessment/Plan:  1. Intertrochanteric fracture of left femur: Sustained following mechanical fall. Orthopedics performed ORIF/IM nailing on 07/20/14. Management per orthopedics who recommend weight bearing as tolerated and aspirin 325 mg daily for DVT prophylaxis for 4 weeks post procedure. Stable for SNF when bed arranged 2. History of advanced dementia: Mental status at baseline. 3. UTI:  ceftin for another 36 hours 4. Mechanical fall: No acute findings on CT head or cervical spine. 5. Hypertension: Controlled. Continue metoprolol. 6. Bowel regimen Anemia: better after 1 unit prbc Hypokalemia: corrected  Code Status: Full Family Communication: husband, Alfredo Bach by phone, as well as daughter 8/29 Disposition Plan: SNF when bed arranged   Consultants:  Orthopedics  Procedures:  ORIF left hip 8/27  Foley catheter  Antibiotics:  Rocephin- DC'ed  Subjective: unable  Objective: Filed Vitals:   07/23/14 0400 07/23/14 0426 07/23/14 0800 07/23/14 1144  BP:  108/60    Pulse:  93    Temp:  98.1 F (36.7 C)    TempSrc:  Oral    Resp: Weight:      SpO2: 100% 97% 94% 96%    Intake/Output Summary (Last 24 hours) at 07/23/14 1206 Last data filed at 07/23/14 0858  Gross per 24 hour  Intake    557 ml  Output      0 ml  Net    557 ml   Filed Weights   07/19/14 0342  Weight: 54.8 kg (120 lb 13 oz)     Exam:  General exam: confused.  Respiratory system: CTA without WRR. Cardiovascular system: NSR without MGR Gastrointestinal system: Abdomen is nondistended, soft and  nontender. Normal bowel sounds heard. Extremities: no pretibial edema. Incision with dried blood. Dressing without drainage. Ecchymoses posterior thigh   Data Reviewed: Basic Metabolic Panel:  Recent Labs Lab 07/19/14 0108 07/21/14 0545 07/22/14 0410  NA 141 140 137  K 4.2 3.6* 4.0  CL 105 105 103  CO2 GLUCOSE 148* 115* 118*  BUN CREATININE 0.73 0.61 0.59  CALCIUM 10.8* 9.8 9.8   Liver Function Tests:  Recent Labs Lab 07/19/14 0108  AST 21  ALT 12  ALKPHOS 149*  BILITOT 0.3  PROT 7.4  ALBUMIN 3.7    Recent Labs Lab 07/19/14 0108  LIPASE 38   No results found for this basename: AMMONIA,  in the last 168 hours CBC:  Recent Labs Lab 07/19/14 0108 07/21/14 0545 07/22/14 0410 07/23/14 0630  WBC 12.9* 6.5 5.7  --   NEUTROABS 11.0*  --   --   --   HGB 12.5 9.0* 7.8* 10.4*  HCT 37.5 26.7* 23.7* 31.1*  MCV 86.8 87.5 85.9  --   PLT 232 180 184  --    Cardiac Enzymes:  Recent Labs Lab 07/19/14 0108  CKTOTAL 111   BNP (last 3 results) No results found for this basename: PROBNP,  in the last 8760 hours CBG: No results found for this basename: GLUCAP,  in the last 168 hours  Recent Results (from the past 240 hour(s))  MRSA PCR SCREENING     Status: None   Collection  Time    07/19/14  3:50 AM      Result Value Ref Range Status   MRSA by PCR NEGATIVE  NEGATIVE Final   Comment:            The GeneXpert MRSA Assay (FDA     approved for NASAL specimens     only), is one component of a     comprehensive MRSA colonization     surveillance program. It is not     intended to diagnose MRSA     infection nor to guide or     monitor treatment for     MRSA infections.  URINE CULTURE     Status: None   Collection Time    07/19/14 12:46 PM      Result Value Ref Range Status   Specimen Description URINE, CATHETERIZED   Final   Special Requests NONE   Final   Culture  Setup Time     Final   Value: 07/19/2014 16:07     Performed at [facility] Count     Final   Value: NO GROWTH     Performed at [facility]   Culture     Final   Value: NO GROWTH     Performed at [facility]   Report Status 07/20/2014 FINAL   Final  SURGICAL PCR SCREEN     Status: None   Collection Time    07/20/14 12:02 AM      Result Value Ref Range Status   MRSA, PCR NEGATIVE  NEGATIVE Final   Staphylococcus aureus NEGATIVE  NEGATIVE Final   Comment:            The Xpert SA Assay (FDA     approved for NASAL specimens     in patients over 37 years of age),     is one component of     a comprehensive surveillance     program.  Test performance has     been validated by [employer] for patients greater     than or equal to 35 year old.     It is not intended     to diagnose infection nor to     guide or monitor treatment.        Studies: No results found.      Scheduled Meds: . aspirin EC  325 mg Oral Q breakfast  . cefUROXime  500 mg Oral BID WC  . ferrous sulfate  300 mg Oral Q breakfast  . LORazepam  0.5 mg Oral Q12H  . metoprolol  50 mg Oral Daily  . polyethylene glycol  17 g Oral Daily  . QUEtiapine  25 mg Oral BID  . senna-docusate  2 tablet Oral QHS   Continuous Infusions:    Principal Problem:   Intertrochanteric fracture of left femur Active Problems:   Dementia   Osteoporosis   HTN (hypertension)   CAD (coronary artery disease)   Anemia   Hypokalemia   UTI (urinary tract infection)    Time spent: 30 minutes    Christiane Ha, MD, [facility] Pager [phone]  If 7PM-7AM, please contact night-coverage [email or web address] Password [email or web address] 07/23/2014, 12:06 PM    LOS: 5 days

## 2014-07-24 DIAGNOSIS — M6281 Muscle weakness (generalized): Secondary | ICD-10-CM | POA: Diagnosis not present

## 2014-07-24 DIAGNOSIS — N390 Urinary tract infection, site not specified: Secondary | ICD-10-CM

## 2014-07-24 DIAGNOSIS — R1319 Other dysphagia: Secondary | ICD-10-CM | POA: Diagnosis not present

## 2014-07-24 DIAGNOSIS — R1310 Dysphagia, unspecified: Secondary | ICD-10-CM | POA: Diagnosis not present

## 2014-07-24 LAB — CBC
HEMATOCRIT: 30.5 % — AB (ref 36.0–46.0)
Hemoglobin: 10.1 g/dL — ABNORMAL LOW (ref 12.0–15.0)
MCH: 29 pg (ref 26.0–34.0)
MCHC: 33.1 g/dL (ref 30.0–36.0)
MCV: 87.6 fL (ref 78.0–100.0)
Platelets: 239 10*3/uL (ref 150–400)
RBC: 3.48 MIL/uL — ABNORMAL LOW (ref 3.87–5.11)
RDW: 15 % (ref 11.5–15.5)
WBC: 7 10*3/uL (ref 4.0–10.5)

## 2014-07-24 LAB — BASIC METABOLIC PANEL
Anion gap: 10 (ref 5–15)
BUN: 15 mg/dL (ref 6–23)
CALCIUM: 10.6 mg/dL — AB (ref 8.4–10.5)
CO2: 26 mEq/L (ref 19–32)
CREATININE: 0.67 mg/dL (ref 0.50–1.10)
Chloride: 99 mEq/L (ref 96–112)
GFR calc Af Amer: >90 mL/min (ref >90)
GFR, EST NON AFRICAN AMERICAN: 84 mL/min — AB (ref >90)
GLUCOSE: 109 mg/dL — AB (ref 70–99)
Potassium: 3.9 mEq/L (ref 3.7–5.3)
Sodium: 135 mEq/L — ABNORMAL LOW (ref 137–147)

## 2014-07-24 MED ORDER — BISACODYL 10 MG RE SUPP: 10.0000 mg | Freq: Every day | RECTAL | Status: DC | PRN

## 2014-07-24 MED ORDER — LORAZEPAM 0.5 MG PO TABS: 0.5000 mg | ORAL_TABLET | Freq: Two times a day (BID) | ORAL | Status: DC

## 2014-07-24 MED ORDER — FLEET ENEMA 7-19 GM/118ML RE ENEM: 1.0000 | ENEMA | Freq: Every day | RECTAL | Status: DC | PRN

## 2014-07-24 MED ORDER — ASPIRIN 325 MG PO TABS
325.0000 mg | ORAL_TABLET | Freq: Every day | ORAL | Status: DC
  Administered 2014-07-24: 325 mg via ORAL
  Filled 2014-07-24: qty 1

## 2014-07-24 MED ORDER — ASPIRIN EC 325 MG PO TBEC: 325.0000 mg | DELAYED_RELEASE_TABLET | Freq: Every day | ORAL | Status: DC

## 2014-07-24 MED ORDER — SENNOSIDES-DOCUSATE SODIUM 8.6-50 MG PO TABS: 2.0000 | ORAL_TABLET | Freq: Every day | ORAL | Status: AC

## 2014-07-24 MED ORDER — ACETAMINOPHEN 325 MG PO TABS: 650.0000 mg | ORAL_TABLET | Freq: Four times a day (QID) | ORAL | Status: DC | PRN

## 2014-07-24 MED ORDER — POLYETHYLENE GLYCOL 3350 17 G PO PACK: 17.0000 g | PACK | Freq: Every day | ORAL | Status: DC

## 2014-07-24 MED ORDER — QUETIAPINE FUMARATE 50 MG PO TABS: 25.0000 mg | ORAL_TABLET | Freq: Two times a day (BID) | ORAL | Status: DC

## 2014-07-24 MED ORDER — SODIUM CHLORIDE 0.9 % IV BOLUS (SEPSIS)
500.0000 mL | Freq: Once | INTRAVENOUS | Status: AC
  Administered 2014-07-24: 500 mL via INTRAVENOUS

## 2014-07-24 MED ORDER — BISACODYL 10 MG RE SUPP
10.0000 mg | Freq: Once | RECTAL | Status: AC
  Administered 2014-07-24: 10 mg via RECTAL

## 2014-07-24 MED ORDER — HYDROCODONE-ACETAMINOPHEN 5-325 MG PO TABS: 1.0000 | ORAL_TABLET | ORAL | Status: DC | PRN

## 2014-07-24 NOTE — Progress Notes (Signed)
I participated in the care of this patient and agree with the above history, physical and evaluation. I performed a review of the history and a physical exam as detailed   Cortlan Dolin Daniel Severa Jeremiah MD  

## 2014-07-24 NOTE — Discharge Summary (Signed)
Physician Discharge Summary  Pamela Blackburn ZOX:[id number] DOB: 09-Dec-1935 DOA: 07/18/2014  PCP: Kaleen Mask, MD  Admit date: 07/18/2014 Discharge date: 07/24/2014  Time spent: greater than 30 minutes  Recommendations for Outpatient Follow-up:  1. Monitor h/h 2. Encourage PO intake 3. Going to SNF  Discharge Diagnoses:  Principal Problem:   Intertrochanteric fracture of left femur Active Problems:   Dementia   Osteoporosis   HTN (hypertension)   CAD (coronary artery disease)   Anemia   Hypokalemia   UTI (urinary tract infection)  Discharge Condition: stable  Filed Weights   07/19/14 0342  Weight: 54.8 kg (120 lb 13 oz)    History of present illness/Hospital course 78 year old female with history of osteoporosis, dementia, hypertension, CAD, presented from nursing home following an unwitnessed fall and right hip pain. She was found to have intertrochanteric fracture of left femur and underwent surgical fixation 8/27.   1. Intertrochanteric fracture of left femur: Sustained following mechanical fall. Orthopedics performed ORIF/IM nailing on 07/20/14. orthopedics recommend weight bearing as tolerated and aspirin 325 mg daily for DVT prophylaxis for 4 weeks post procedure.  2. History of advanced dementia: Mental status at baseline. 3. UTI, culture negative. Completed course of antibiotics 4. Mechanical fall: No acute findings on CT head or cervical spine. 5. Hypertension: Controlled. Continue metoprolol. Anemia, acute blood loss: hgb dropped to 7.8 after surgery. After 1 unit PRBC, stable over 10 Hypokalemia: corrected   Code status: full  Consultants:  Orthopedics Procedures:  IM nail left hip 8/27   Discharge Exam: Filed Vitals:   07/24/14 1022  BP: 146/105  Pulse:   Temp:   Resp:     General: in chair. Asleep. arousable Cardiovascular: RRR without MGR Respiratory: CTA without WRR Ext incision ok. No CCE  Discharge Instructions   Diet - low sodium  heart healthy    Complete by:  As directed      Walk with assistance    Complete by:  As directed      Weight bearing as tolerated    Complete by:  As directed             Medication List         acetaminophen 325 MG tablet  Commonly known as:  TYLENOL  Take 2 tablets (650 mg total) by mouth every 6 (six) hours as needed for mild pain (or Fever >/= 101).     aspirin EC 325 MG tablet  Take 1 tablet (325 mg total) by mouth daily. For 1 month     bisacodyl 10 MG suppository  Commonly known as:  DULCOLAX  Place 1 suppository (10 mg total) rectally daily as needed for moderate constipation.     docusate sodium 100 MG capsule  Commonly known as:  COLACE  Take 1 capsule (100 mg total) by mouth 2 (two) times daily. Continue this while taking narcotics to help with bowel movements     ferrous sulfate 325 (65 FE) MG tablet  Take 325 mg by mouth daily with breakfast.     HYDROcodone-acetaminophen 5-325 MG per tablet  Commonly known as:  NORCO  Take 1-2 tablets by mouth every 4 (four) hours as needed for moderate pain.     LORazepam 0.5 MG tablet  Commonly known as:  ATIVAN  Take 1 tablet (0.5 mg total) by mouth every 12 (twelve) hours. Hold for sedation     metoprolol 50 MG tablet  Commonly known as:  LOPRESSOR  Take 50 mg by mouth daily.  polyethylene glycol packet  Commonly known as:  MIRALAX / GLYCOLAX  Take 17 g by mouth daily.     QUEtiapine 50 MG tablet  Commonly known as:  SEROQUEL  Take 0.5 tablets (25 mg total) by mouth 2 (two) times daily. Hold for sedation     senna-docusate 8.6-50 MG per tablet  Commonly known as:  Senokot-S  Take 2 tablets by mouth at bedtime.     sodium phosphate 7-19 GM/118ML Enem  Place 133 mLs (1 enema total) rectally daily as needed for severe constipation.     vitamin B-12 1000 MCG tablet  Commonly known as:  CYANOCOBALAMIN  Take 1,000 mcg by mouth daily.       No Known Allergies     Follow-up Information   Follow up with  MURPHY, TIMOTHY, D, MD In 2 weeks.   Specialty:  Orthopedic Surgery   Contact information:   [address] [place] [address] [phone]        The results of significant diagnostics from this hospitalization (including imaging, microbiology, ancillary and laboratory) are listed below for reference.    Significant Diagnostic Studies: Dg Chest 1 View  07/19/2014   CLINICAL DATA:  Fall.  Left lower extremity deformity.  EXAM: CHEST - 1 VIEW  COMPARISON:  07/27/2012  FINDINGS: The heart size and mediastinal contours are within normal limits. Both lungs are clear. The visualized skeletal structures are unremarkable.  IMPRESSION: No active disease.   Electronically Signed   By: Burman Nieves M.D.   On: 07/19/2014 01:39   Dg Hip Complete Left  07/19/2014   CLINICAL DATA:  Fall with left lower extremity deformity.  EXAM: LEFT HIP - COMPLETE 2+ VIEW  COMPARISON:  None.  FINDINGS: Comminuted inter trochanteric fracture of the left hip with varus angulation and displacement of lesser and greater trochanteric fragments. No evidence of dislocation of the hip. The pelvis appears intact. Postoperative changes in the right hip.  IMPRESSION: Comminuted intertrochanteric fracture of the left hip.   Electronically Signed   By: Burman Nieves M.D.   On: 07/19/2014 01:34   Dg Femur Left  07/20/2014   CLINICAL DATA:  Intertrochanteric fracture of the proximal left femur.  EXAM: LEFT FEMUR - 2 VIEW  COMPARISON:  Radiographs dated 07/19/2014  FINDINGS: Intra medullary nail has been inserted as well as a lag screw. Distal fixation screw has been inserted. Alignment and position of the fracture is near anatomic.  IMPRESSION: Open reduction and internal fixation of intertrochanteric fracture of the proximal left femur.   Electronically Signed   By: Geanie Cooley M.D.   On: 07/20/2014 16:14   Dg Femur Left  07/20/2014   CLINICAL DATA:  Nailing LEFT hip  EXAM: LEFT FEMUR - 2 VIEW; DG C-ARM  61-120 MIN  COMPARISON:  07/19/2014  FINDINGS: Four digital C-arm fluoroscopic images submitted.  Images demonstrate placement of an IM nail with compression screw across a reduced intertrochanteric fracture of the LEFT femur.  Diffuse osseous demineralization.  Hip and knee joint alignments grossly normal.  Distal locking screw present.  No dislocation or other focal bony abnormalities identified.  IMPRESSION: Post nailing of an intertrochanteric fracture of the LEFT femur.   Electronically Signed   By: Ulyses Southward M.D.   On: 07/20/2014 09:06   Dg Femur Left  07/19/2014   CLINICAL DATA:  Fall.  Left lower extremity deformity.  EXAM: LEFT FEMUR - 2 VIEW  COMPARISON:  Left hip 07/19/2014  FINDINGS:  Comminuted intertrochanteric fracture again demonstrated in the left hip with varus angulation. Mid and distal femoral shaft appears intact. Soft tissues are unremarkable.  IMPRESSION: Intertrochanteric fracture in the left hip. Left femur otherwise intact.   Electronically Signed   By: Burman Nieves M.D.   On: 07/19/2014 01:40   Ct Head Wo Contrast  07/19/2014   CLINICAL DATA:  Fall.  Confusion.  EXAM: CT HEAD WITHOUT CONTRAST  CT CERVICAL SPINE WITHOUT CONTRAST  TECHNIQUE: Multidetector CT imaging of the head and cervical spine was performed following the standard protocol without intravenous contrast. Multiplanar CT image reconstructions of the cervical spine were also generated.  COMPARISON:  CT head and cervical spine 02/22/2014  FINDINGS: CT HEAD FINDINGS  Diffuse cerebral atrophy. Mild ventricular dilatation consistent with central atrophy. Low-attenuation change in the deep white matter consistent with small vessel ischemia. No mass effect or midline shift. No abnormal extra-axial fluid collections. Gray-white matter junctions are distinct. Basal cisterns are not effaced. No evidence of acute intracranial hemorrhage. No depressed skull fractures. Visualized paranasal sinuses and mastoid air cells are not  opacified.  CT CERVICAL SPINE FINDINGS  Diffuse bone demineralization. Degenerative changes throughout the cervical spine with narrowed cervical interspaces and endplate hypertrophic changes. Degenerative changes in the facet joints. Normal alignment of the cervical spine. Normal alignment of the facet joints. No vertebral compression deformities. No prevertebral soft tissue swelling. C1-2 articulation appears intact. No focal bone lesion or bone destruction. Bone cortex and trabecular architecture appear intact. Incidental note of thoracic scoliosis with convexity towards the right.  IMPRESSION: No acute intracranial abnormalities. Chronic atrophy and small vessel ischemic changes.  Normal alignment of the cervical spine. No displaced fractures identified. Degenerative changes.   Electronically Signed   By: Burman Nieves M.D.   On: 07/19/2014 06:17   Ct Cervical Spine Wo Contrast  07/19/2014   CLINICAL DATA:  Fall.  Confusion.  EXAM: CT HEAD WITHOUT CONTRAST  CT CERVICAL SPINE WITHOUT CONTRAST  TECHNIQUE: Multidetector CT imaging of the head and cervical spine was performed following the standard protocol without intravenous contrast. Multiplanar CT image reconstructions of the cervical spine were also generated.  COMPARISON:  CT head and cervical spine 02/22/2014  FINDINGS: CT HEAD FINDINGS  Diffuse cerebral atrophy. Mild ventricular dilatation consistent with central atrophy. Low-attenuation change in the deep white matter consistent with small vessel ischemia. No mass effect or midline shift. No abnormal extra-axial fluid collections. Gray-white matter junctions are distinct. Basal cisterns are not effaced. No evidence of acute intracranial hemorrhage. No depressed skull fractures. Visualized paranasal sinuses and mastoid air cells are not opacified.  CT CERVICAL SPINE FINDINGS  Diffuse bone demineralization. Degenerative changes throughout the cervical spine with narrowed cervical interspaces and endplate  hypertrophic changes. Degenerative changes in the facet joints. Normal alignment of the cervical spine. Normal alignment of the facet joints. No vertebral compression deformities. No prevertebral soft tissue swelling. C1-2 articulation appears intact. No focal bone lesion or bone destruction. Bone cortex and trabecular architecture appear intact. Incidental note of thoracic scoliosis with convexity towards the right.  IMPRESSION: No acute intracranial abnormalities. Chronic atrophy and small vessel ischemic changes.  Normal alignment of the cervical spine. No displaced fractures identified. Degenerative changes.   Electronically Signed   By: Burman Nieves M.D.   On: 07/19/2014 06:17   Dg C-arm 1-60 Min  07/20/2014   CLINICAL DATA:  Nailing LEFT hip  EXAM: LEFT FEMUR - 2 VIEW; DG C-ARM 61-120 MIN  COMPARISON:  07/19/2014  FINDINGS: Four digital C-arm fluoroscopic images submitted.  Images demonstrate placement of an IM nail with compression screw across a reduced intertrochanteric fracture of the LEFT femur.  Diffuse osseous demineralization.  Hip and knee joint alignments grossly normal.  Distal locking screw present.  No dislocation or other focal bony abnormalities identified.  IMPRESSION: Post nailing of an intertrochanteric fracture of the LEFT femur.   Electronically Signed   By: Ulyses Southward M.D.   On: 07/20/2014 09:06    Microbiology: Recent Results (from the past 240 hour(s))  MRSA PCR SCREENING     Status: None   Collection Time    07/19/14  3:50 AM      Result Value Ref Range Status   MRSA by PCR NEGATIVE  NEGATIVE Final   Comment:            The GeneXpert MRSA Assay (FDA     approved for NASAL specimens     only), is one component of a     comprehensive MRSA colonization     surveillance program. It is not     intended to diagnose MRSA     infection nor to guide or     monitor treatment for     MRSA infections.  URINE CULTURE     Status: None   Collection Time    07/19/14 12:46  PM      Result Value Ref Range Status   Specimen Description URINE, CATHETERIZED   Final   Special Requests NONE   Final   Culture  Setup Time     Final   Value: 07/19/2014 16:07     Performed at [facility] Count     Final   Value: NO GROWTH     Performed at [facility]   Culture     Final   Value: NO GROWTH     Performed at [facility]   Report Status 07/20/2014 FINAL   Final  SURGICAL PCR SCREEN     Status: None   Collection Time    07/20/14 12:02 AM      Result Value Ref Range Status   MRSA, PCR NEGATIVE  NEGATIVE Final   Staphylococcus aureus NEGATIVE  NEGATIVE Final   Comment:            The Xpert SA Assay (FDA     approved for NASAL specimens     in patients over 59 years of age),     is one component of     a comprehensive surveillance     program.  Test performance has     been validated by [employer] for patients greater     than or equal to 30 year old.     It is not intended     to diagnose infection nor to     guide or monitor treatment.     Labs: Basic Metabolic Panel:  Recent Labs Lab 07/19/14 0108 07/21/14 0545 07/22/14 0410 07/24/14 0431  NA 141 140 137 135*  K 4.2 3.6* 4.0 3.9  CL 105 105 103 99  CO2 GLUCOSE 148* 115* 118* 109*  BUN CREATININE 0.73 0.61 0.59 0.67  CALCIUM 10.8* 9.8 9.8 10.6*   Liver Function Tests:  Recent Labs Lab 07/19/14 0108  AST 21  ALT 12  ALKPHOS 149*  BILITOT 0.3  PROT 7.4  ALBUMIN 3.7  Recent Labs Lab 07/19/14 0108  LIPASE 38   No results found for this basename: AMMONIA,  in the last 168 hours CBC:  Recent Labs Lab 07/19/14 0108 07/21/14 0545 07/22/14 0410 07/23/14 0630 07/24/14 0431  WBC 12.9* 6.5 5.7  --  7.0  NEUTROABS 11.0*  --   --   --   --   HGB 12.5 9.0* 7.8* 10.4* 10.1*  HCT 37.5 26.7* 23.7* 31.1* 30.5*  MCV 86.8 87.5 85.9  --  87.6  PLT 232 180 184  --  239   Cardiac Enzymes:  Recent Labs Lab 07/19/14 0108   CKTOTAL 111   BNP: BNP (last 3 results) No results found for this basename: PROBNP,  in the last 8760 hours CBG: No results found for this basename: GLUCAP,  in the last 168 hours     Signed:  Haylyn Halberg L  [facility] 07/24/2014, 12:28 PM

## 2014-07-24 NOTE — Progress Notes (Signed)
Physical Therapy Treatment Patient Details Name: Pamela Blackburn MRN: [id number] DOB: 1936/08/13 Today's Date: 07/24/2014    History of Present Illness This 78 y.o. female with h/o severe dementia admitted from memory care facility after being found on the floor in the bathroom indicating Lt. hip pain.  xray showed Lt. interotrochanteric hip fracture.  Underwent IM nailing.  Pt is WBAT    PT Comments    Pt admitted with above. Pt currently with functional limitations due to pain and cognition limiting activity.  Changing frequency to 2xwk given limited progress toward goals by pt due to her pain and cognitive deficits.  Continue to recommend SNF.    Pt will benefit from skilled PT to increase their independence and safety with mobility to allow discharge to the venue listed below.   Follow Up Recommendations  SNF;Supervision/Assistance - 24 hour     Equipment Recommendations  None recommended by PT    Recommendations for Other Services       Precautions / Restrictions Precautions Precautions: Fall Precaution Comments: severe dementia Restrictions Weight Bearing Restrictions: Yes LLE Weight Bearing: Weight bearing as tolerated    Mobility  Bed Mobility Overal bed mobility: Needs Assistance;+2 for physical assistance Bed Mobility: Rolling;Supine to Sit Rolling: Max assist   Supine to sit: +2 for physical assistance;Total assist     General bed mobility comments: Pt not following any commands/cues.  Required total assist for all components of transitional movements  Transfers Overall transfer level: Needs assistance Equipment used: None Transfers: Sit to/from [employer] Transfers Sit to Stand: +2 physical assistance;Max assist   Squat pivot transfers: Total assist;+2 physical assistance     General transfer comment: cues for technique with pt unable to follow any cues.  use of draw pad under pts hips to lift, rotate, & position in recliner.    Ambulation/Gait                 Stairs            Wheelchair Mobility    Modified Rankin (Stroke Patients Only)       Balance Overall balance assessment: Needs assistance;History of Falls Sitting-balance support: No upper extremity supported;Feet supported Sitting balance-Leahy Scale: Fair Sitting balance - Comments: Was able to sit EOB for 10 minutes without assist.    Standing balance support: Bilateral upper extremity supported;During functional activity Standing balance-Leahy Scale: Poor Standing balance comment: Cannot stand fully upright.  Needs total assist.                     Cognition Arousal/Alertness: Awake/alert Behavior During Therapy: Restless Overall Cognitive Status: No family/caregiver present to determine baseline cognitive functioning                      Exercises General Exercises - Lower Extremity Ankle Circles/Pumps: AAROM;Both;5 reps;Supine Heel Slides: AAROM;Both;5 reps;Supine    General Comments        Pertinent Vitals/Pain Pain Assessment: Faces Faces Pain Scale: Hurts whole lot Pain Location: left hip with movement Pain Descriptors / Indicators: Moaning Pain Intervention(s): Limited activity within patient's tolerance;Monitored during session;Repositioned VSS    Home Living                      Prior Function            PT Goals (current goals can now be found in the care plan section) Progress towards PT goals: Not progressing toward goals - comment (  Pt with cognition issues affecting progress)    Frequency  Min 2X/week    PT Plan Frequency needs to be updated    Co-evaluation             End of Session Equipment Utilized During Treatment: Gait belt Activity Tolerance: Patient limited by pain (limited by cognitive deficits) Patient left: in chair;with call bell/phone within reach;with chair alarm set     Time: [phone] PT Time Calculation (min): 15 min  Charges:  $Therapeutic Activity: 8-22  mins                    G Codes:      INGOLD,Pietrina Jagodzinski 08/23/2014, 1:36 PM [facility] [phone] (pager)

## 2014-07-24 NOTE — Clinical Social Work Note (Signed)
Clinical Social Worker facilitated patient discharge including contacting patient family and facility to confirm patient discharge plans.  Clinical information faxed to facility and family agreeable with plan.  CSW arranged ambulance transport via [facility] to [facility].  RN to call report prior to discharge.  Clinical Social Worker will sign off for now as social work intervention is no longer needed. Please consult us again if new need arises.  Jesse Kili Gracy, LCSW [phone] 

## 2014-07-24 NOTE — Progress Notes (Signed)
I participated in the care of this patient and agree with the above history, physical and evaluation. I performed a review of the history and a physical exam as detailed   Ulysses Alper Daniel Ransom Nickson MD  

## 2014-07-25 ENCOUNTER — Non-Acute Institutional Stay (SKILLED_NURSING_FACILITY): Payer: Medicare HMO | Admitting: Internal Medicine

## 2014-07-25 ENCOUNTER — Other Ambulatory Visit: Payer: Self-pay | Admitting: *Deleted

## 2014-07-25 DIAGNOSIS — S72009D Fracture of unspecified part of neck of unspecified femur, subsequent encounter for closed fracture with routine healing: Secondary | ICD-10-CM

## 2014-07-25 DIAGNOSIS — I10 Essential (primary) hypertension: Secondary | ICD-10-CM

## 2014-07-25 DIAGNOSIS — N3000 Acute cystitis without hematuria: Secondary | ICD-10-CM

## 2014-07-25 DIAGNOSIS — W19XXXD Unspecified fall, subsequent encounter: Secondary | ICD-10-CM

## 2014-07-25 DIAGNOSIS — Z5189 Encounter for other specified aftercare: Secondary | ICD-10-CM

## 2014-07-25 DIAGNOSIS — S72142D Displaced intertrochanteric fracture of left femur, subsequent encounter for closed fracture with routine healing: Secondary | ICD-10-CM

## 2014-07-25 DIAGNOSIS — F0390 Unspecified dementia without behavioral disturbance: Secondary | ICD-10-CM

## 2014-07-25 DIAGNOSIS — D62 Acute posthemorrhagic anemia: Secondary | ICD-10-CM

## 2014-07-25 DIAGNOSIS — W19XXXA Unspecified fall, initial encounter: Secondary | ICD-10-CM

## 2014-07-25 MED ORDER — HYDROCODONE-ACETAMINOPHEN 5-325 MG PO TABS: ORAL_TABLET | ORAL | Status: DC

## 2014-07-25 MED ORDER — LORAZEPAM 0.5 MG PO TABS: 0.5000 mg | ORAL_TABLET | Freq: Two times a day (BID) | ORAL | Status: DC

## 2014-07-25 NOTE — Progress Notes (Signed)
MRN: [id number] Name: Pamela Blackburn  Sex: female Age: 78 y.o. DOB: 1936/04/20  PSC #: Ronni Rumble Facility/Room: 125A Level Of Care: SNF Provider: Merrilee Seashore D Emergency Contacts: Extended Emergency Contact Information Primary Emergency Contact: Coots,Lisa Address: [address]          [place], [place] [address] [place] Home Phone: [phone] Relation: Daughter Secondary Emergency Contact: Jones,Hunter  [place] Home Phone: [phone] Relation: Relative   Allergies: Review of patient's allergies indicates no known allergies.  Chief Complaint  Patient presents with  . New Admit To SNF    HPI: Patient is 78 y.o. female who is s/p L hip fx, s/p ORIF, after a fall, admitted for OT/PT.  Past Medical History  Diagnosis Date  . Anemia   . Osteoporosis   . Coronary artery disease   . Dementia   . Hypertension   . Leukocytosis 07/26/2012  . Hyperlipidemia 07/26/2012  . Pernicious anemia 07/26/2012  . HTN (hypertension) 07/26/2012  . CAD (coronary artery disease) 07/26/2012    Cath 12/26/05: LAD 10-15% mid stenosis and distally, LCIRC small and patent, RCA 15-20% mid stenosis. EF 45%, LV with mild anterolateral and mid inferior wall hypokinesia  Myoview : 12/13/2009: No evidence of inducible reversible ischemia EF 77%  . Anxiety 07/26/2012  . History of tobacco use 07/26/2012  . Hyperparathyroidism 07/26/2012  . Vitamin D deficiency 07/26/2012    Past Surgical History  Procedure Laterality Date  . Tonsillectomy    . Intramedullary (im) nail intertrochanteric Left 07/20/2014    Procedure: INTRAMEDULLARY (IM) NAIL INTERTROCHANTRIC;  Surgeon: Sheral Apley, MD;  Location: [facility] OR;  Service: Orthopedics;  Laterality: Left;      Medication List       This list is accurate as of: 07/25/14 11:59 PM.  Always use your most recent med list.               acetaminophen 325 MG tablet  Commonly known as:  TYLENOL  Take 2 tablets (650 mg total) by mouth  every 6 (six) hours as needed for mild pain (or Fever >/= 101).     aspirin EC 325 MG tablet  Take 1 tablet (325 mg total) by mouth daily. For 1 month     bisacodyl 10 MG suppository  Commonly known as:  DULCOLAX  Place 1 suppository (10 mg total) rectally daily as needed for moderate constipation.     docusate sodium 100 MG capsule  Commonly known as:  COLACE  Take 1 capsule (100 mg total) by mouth 2 (two) times daily. Continue this while taking narcotics to help with bowel movements     ferrous sulfate 325 (65 FE) MG tablet  Take 325 mg by mouth daily with breakfast.     HYDROcodone-acetaminophen 5-325 MG per tablet  Commonly known as:  NORCO  Take one tablet by mouth every 4 hours as needed for moderate pain; Take two tablets by mouth every 4 hours as needed for severe pain     LORazepam 0.5 MG tablet  Commonly known as:  ATIVAN  Take 1 tablet (0.5 mg total) by mouth every 12 (twelve) hours. Hold for sedation     metoprolol 50 MG tablet  Commonly known as:  LOPRESSOR  Take 50 mg by mouth daily.     polyethylene glycol packet  Commonly known as:  MIRALAX / GLYCOLAX  Take 17 g by mouth daily.     QUEtiapine 50 MG tablet  Commonly known as:  SEROQUEL  Take 0.5 tablets (25 mg total) by mouth 2 (two) times daily. Hold for sedation     senna-docusate 8.6-50 MG per tablet  Commonly known as:  Senokot-S  Take 2 tablets by mouth at bedtime.     sodium phosphate 7-19 GM/118ML Enem  Place 133 mLs (1 enema total) rectally daily as needed for severe constipation.     vitamin B-12 1000 MCG tablet  Commonly known as:  CYANOCOBALAMIN  Take 1,000 mcg by mouth daily.        No orders of the defined types were placed in this encounter.    Immunization History  Administered Date(s) Administered  . Pneumococcal Polysaccharide-23 08/02/2012    History  Substance Use Topics  . Smoking status: Former Smoker -- 0.50 packs/day    Types: Cigarettes  . Smokeless tobacco: Not on  file  . Alcohol Use: No    Family history is noncontributory    Review of Systems  - pt with dementia,pt has no c/o; ROS not reliable    Filed Vitals:   07/25/14 1740  BP: 122/65  Pulse: 70  Temp: 97.6 F (36.4 C)  Resp: 20    Physical Exam  GENERAL APPEARANCE: Alert, min conversant. Appropriately groomed. No acute distress.  SKIN: No diaphoresis rash HEAD: Normocephalic, atraumatic  EYES: Conjunctiva/lids clear. Pupils round, reactive. EOMs intact.  EARS: External exam WNL, canals clear. Hearing grossly normal.  NOSE: No deformity or discharge.  MOUTH/THROAT: Lips w/o lesions RESPIRATORY: Breathing is even, unlabored. Lung sounds are clear   CARDIOVASCULAR: Heart RRR no murmurs, rubs or gallops. No peripheral edema.  GASTROINTESTINAL: Abdomen is soft, non-tender, not distended w/ normal bowel sounds GENITOURINARY: Bladder non tender, not distended  MUSCULOSKELETAL: No abnormal joints or musculature NEUROLOGIC:  Cranial nerves 2-12 grossly intact. Moves all extremities. PSYCHIATRIC: vague, dementia, no behavioral issues  Patient Active Problem List   Diagnosis Date Noted  . Fall 07/29/2014  . Acute blood loss anemia 07/29/2014  . Anemia 07/21/2014  . Hypokalemia 07/21/2014  . UTI (urinary tract infection) 07/21/2014  . Intertrochanteric fracture of left femur 07/19/2014  . Hip fracture 07/19/2014  . Fever 07/27/2012  . Closed intertrochanteric fracture of right hip 07/26/2012  . Leukocytosis 07/26/2012  . Hypercalcemia 07/26/2012  . Dehydration 07/26/2012  . Hyperlipidemia 07/26/2012  . Pernicious anemia 07/26/2012  . Dementia without behavioral disturbance 07/26/2012  . Osteoporosis 07/26/2012  . HTN (hypertension) 07/26/2012  . CAD (coronary artery disease) 07/26/2012  . Anxiety 07/26/2012  . History of tobacco use 07/26/2012  . Hyperparathyroidism 07/26/2012  . Vitamin d deficiency 07/26/2012    CBC    Component Value Date/Time   WBC 7.0 07/24/2014  0431   RBC 3.48* 07/24/2014 0431   HGB 10.1* 07/24/2014 0431   HCT 30.5* 07/24/2014 0431   PLT 239 07/24/2014 0431   MCV 87.6 07/24/2014 0431   LYMPHSABS 1.1 07/19/2014 0108   MONOABS 0.7 07/19/2014 0108   EOSABS 0.0 07/19/2014 0108   BASOSABS 0.0 07/19/2014 0108    CMP     Component Value Date/Time   NA 135* 07/24/2014 0431   K 3.9 07/24/2014 0431   CL 99 07/24/2014 0431   CO2 26 07/24/2014 0431   GLUCOSE 109* 07/24/2014 0431   BUN 15 07/24/2014 0431   CREATININE 0.67 07/24/2014 0431   CALCIUM 10.6* 07/24/2014 0431   CALCIUM 11.0* 07/26/2012 1503   PROT 7.4 07/19/2014 0108   ALBUMIN 3.7 07/19/2014 0108   AST 21 07/19/2014 0108  ALT 12 07/19/2014 0108   ALKPHOS 149* 07/19/2014 0108   BILITOT 0.3 07/19/2014 0108   GFRNONAA 84* 07/24/2014 0431   GFRAA >90 07/24/2014 0431    Assessment and Plan  Intertrochanteric fracture of left femur Sustained following mechanical fall. Orthopedics performed ORIF/IM nailing on 07/20/14. orthopedics recommend weight bearing as tolerated and aspirin 325 mg daily for DVT prophylaxis for 4 weeks post procedure   Fall No acute findings on CT head or cervical spine   UTI (urinary tract infection) Cx neg;was treated  Dementia without behavioral disturbance Continue seroquel and ativan  HTN (hypertension) Stable on lopressor  Acute blood loss anemia hgb dropped to 7.8 after surgery. After 1 unit PRBC, stable over 10     Brennyn Ortlieb, Randon Goldsmith, MD

## 2014-07-25 NOTE — Telephone Encounter (Signed)
[facility] 

## 2014-07-29 ENCOUNTER — Encounter: Payer: Self-pay | Admitting: Internal Medicine

## 2014-07-29 DIAGNOSIS — D62 Acute posthemorrhagic anemia: Secondary | ICD-10-CM | POA: Insufficient documentation

## 2014-07-29 DIAGNOSIS — W19XXXA Unspecified fall, initial encounter: Secondary | ICD-10-CM | POA: Insufficient documentation

## 2014-07-29 NOTE — Assessment & Plan Note (Signed)
Continue seroquel and ativan 

## 2014-07-29 NOTE — Assessment & Plan Note (Signed)
hgb dropped to 7.8 after surgery. After 1 unit PRBC, stable over 10

## 2014-07-29 NOTE — Assessment & Plan Note (Signed)
No acute findings on CT head or cervical spine

## 2014-07-29 NOTE — Assessment & Plan Note (Signed)
Stable on lopressor

## 2014-07-29 NOTE — Assessment & Plan Note (Signed)
Cx neg;was treated

## 2014-07-29 NOTE — Assessment & Plan Note (Signed)
Sustained following mechanical fall. Orthopedics performed ORIF/IM nailing on 07/20/14. orthopedics recommend weight bearing as tolerated and aspirin 325 mg daily for DVT prophylaxis for 4 weeks post procedure

## 2014-08-16 ENCOUNTER — Non-Acute Institutional Stay (SKILLED_NURSING_FACILITY): Payer: Medicare HMO | Admitting: Internal Medicine

## 2014-08-16 DIAGNOSIS — F0390 Unspecified dementia without behavioral disturbance: Secondary | ICD-10-CM

## 2014-08-16 DIAGNOSIS — I10 Essential (primary) hypertension: Secondary | ICD-10-CM

## 2014-08-16 DIAGNOSIS — S72142D Displaced intertrochanteric fracture of left femur, subsequent encounter for closed fracture with routine healing: Secondary | ICD-10-CM

## 2014-08-16 DIAGNOSIS — D62 Acute posthemorrhagic anemia: Secondary | ICD-10-CM

## 2014-08-16 DIAGNOSIS — S72009D Fracture of unspecified part of neck of unspecified femur, subsequent encounter for closed fracture with routine healing: Secondary | ICD-10-CM

## 2014-08-16 NOTE — Assessment & Plan Note (Addendum)
Pt is failing to thrive quickly; PT notes big decline from last week to this week, in terms of activity.  ST notes pt sucks when fed, doesn't eat or drink much.

## 2014-08-16 NOTE — Progress Notes (Signed)
MRN: [id number] Name: Pamela Blackburn  Sex: female Age: 78 y.o. DOB: [birth year]  PSC #: Ronni Rumble Facility/Room: 212A Level Of Care: SNF Provider: Merrilee Seashore D Emergency Contacts: Extended Emergency Contact Information Primary Emergency Contact: Poinsett,Lisa Address: [address]          [place], [place] [address] [place] Home Phone: [phone] Relation: Daughter Secondary Emergency Contact: Jones,Hunter  [place] Home Phone: [phone] Relation: Relative    Allergies: Review of patient's allergies indicates no known allergies.  Chief Complaint  Patient presents with  . Hospitalization Follow-up    HPI: Patient is 78 y.o. female whose daughter has called asking for an update on her mother.  Past Medical History  Diagnosis Date  . Anemia   . Osteoporosis   . Coronary artery disease   . Dementia   . Hypertension   . Leukocytosis 07/26/2012  . Hyperlipidemia 07/26/2012  . Pernicious anemia 07/26/2012  . HTN (hypertension) 07/26/2012  . CAD (coronary artery disease) 07/26/2012    Cath 12/26/05: LAD 10-15% mid stenosis and distally, LCIRC small and patent, RCA 15-20% mid stenosis. EF 45%, LV with mild anterolateral and mid inferior wall hypokinesia  Myoview : 12/13/2009: No evidence of inducible reversible ischemia EF 77%  . Anxiety 07/26/2012  . History of tobacco use 07/26/2012  . Hyperparathyroidism 07/26/2012  . Vitamin D deficiency 07/26/2012    Past Surgical History  Procedure Laterality Date  . Tonsillectomy    . Intramedullary (im) nail intertrochanteric Left 07/20/2014    Procedure: INTRAMEDULLARY (IM) NAIL INTERTROCHANTRIC;  Surgeon: Sheral Apley, MD;  Location: [facility] OR;  Service: Orthopedics;  Laterality: Left;      Medication List       This list is accurate as of: 08/16/14 11:59 PM.  Always use your most recent med list.               acetaminophen 325 MG tablet  Commonly known as:  TYLENOL  Take 2 tablets (650 mg total) by  mouth every 6 (six) hours as needed for mild pain (or Fever >/= 101).     aspirin EC 325 MG tablet  Take 1 tablet (325 mg total) by mouth daily. For 1 month     bisacodyl 10 MG suppository  Commonly known as:  DULCOLAX  Place 1 suppository (10 mg total) rectally daily as needed for moderate constipation.     docusate sodium 100 MG capsule  Commonly known as:  COLACE  Take 1 capsule (100 mg total) by mouth 2 (two) times daily. Continue this while taking narcotics to help with bowel movements     ferrous sulfate 325 (65 FE) MG tablet  Take 325 mg by mouth daily with breakfast.     HYDROcodone-acetaminophen 5-325 MG per tablet  Commonly known as:  NORCO  Take one tablet by mouth every 4 hours as needed for moderate pain; Take two tablets by mouth every 4 hours as needed for severe pain     LORazepam 0.5 MG tablet  Commonly known as:  ATIVAN  Take 1 tablet (0.5 mg total) by mouth every 12 (twelve) hours. Hold for sedation     metoprolol 50 MG tablet  Commonly known as:  LOPRESSOR  Take 50 mg by mouth daily.     polyethylene glycol packet  Commonly known as:  MIRALAX / GLYCOLAX  Take 17 g by mouth daily.     QUEtiapine 50 MG tablet  Commonly known as:  SEROQUEL  Take  0.5 tablets (25 mg total) by mouth 2 (two) times daily. Hold for sedation     senna-docusate 8.6-50 MG per tablet  Commonly known as:  Senokot-S  Take 2 tablets by mouth at bedtime.     sodium phosphate 7-19 GM/118ML Enem  Place 133 mLs (1 enema total) rectally daily as needed for severe constipation.     vitamin B-12 1000 MCG tablet  Commonly known as:  CYANOCOBALAMIN  Take 1,000 mcg by mouth daily.        No orders of the defined types were placed in this encounter.    Immunization History  Administered Date(s) Administered  . Pneumococcal Polysaccharide-23 08/02/2012    History  Substance Use Topics  . Smoking status: Former Smoker -- 0.50 packs/day    Types: Cigarettes  . Smokeless tobacco:  Not on file  . Alcohol Use: No    Review of Systems    UTO from pt- she can not comply    Filed Vitals:   08/16/14 1037  BP: 120/76  Pulse: 87  Temp: 98.1 F (36.7 C)  Resp: 18    Physical Exam  GENERAL APPEARANCE: Alert, nonconversant No acute distress  SKIN: No diaphoresis rash HEENT: Unremarkable RESPIRATORY: Breathing is even, unlabored. Lung sounds are clear   CARDIOVASCULAR: Heart RRR no murmurs, rubs or gallops. No peripheral edema  GASTROINTESTINAL: Abdomen is soft, non-tender, not distended w/ normal bowel sounds.  GENITOURINARY: Bladder non tender, not distended  MUSCULOSKELETAL: No abnormal joints or musculature NEUROLOGIC:  Moves all extremities  PSYCHIATRIC: dementia, no behavioral issues  Patient Active Problem List   Diagnosis Date Noted  . Fall 07/29/2014  . Acute blood loss anemia 07/29/2014  . Anemia 07/21/2014  . Hypokalemia 07/21/2014  . UTI (urinary tract infection) 07/21/2014  . Intertrochanteric fracture of left femur 07/19/2014  . Hip fracture 07/19/2014  . Fever 07/27/2012  . Closed intertrochanteric fracture of right hip 07/26/2012  . Leukocytosis 07/26/2012  . Hypercalcemia 07/26/2012  . Dehydration 07/26/2012  . Hyperlipidemia 07/26/2012  . Pernicious anemia 07/26/2012  . Dementia without behavioral disturbance 07/26/2012  . Osteoporosis 07/26/2012  . HTN (hypertension) 07/26/2012  . CAD (coronary artery disease) 07/26/2012  . Anxiety 07/26/2012  . History of tobacco use 07/26/2012  . Hyperparathyroidism 07/26/2012  . Vitamin d deficiency 07/26/2012    CBC    Component Value Date/Time   WBC 7.0 07/24/2014 0431   RBC 3.48* 07/24/2014 0431   HGB 10.1* 07/24/2014 0431   HCT 30.5* 07/24/2014 0431   PLT 239 07/24/2014 0431   MCV 87.6 07/24/2014 0431   LYMPHSABS 1.1 07/19/2014 0108   MONOABS 0.7 07/19/2014 0108   EOSABS 0.0 07/19/2014 0108   BASOSABS 0.0 07/19/2014 0108    CMP     Component Value Date/Time   NA 135* 07/24/2014 0431    K 3.9 07/24/2014 0431   CL 99 07/24/2014 0431   CO2 26 07/24/2014 0431   GLUCOSE 109* 07/24/2014 0431   BUN 15 07/24/2014 0431   CREATININE 0.67 07/24/2014 0431   CALCIUM 10.6* 07/24/2014 0431   CALCIUM 11.0* 07/26/2012 1503   PROT 7.4 07/19/2014 0108   ALBUMIN 3.7 07/19/2014 0108   AST 21 07/19/2014 0108   ALT 12 07/19/2014 0108   ALKPHOS 149* 07/19/2014 0108   BILITOT 0.3 07/19/2014 0108   GFRNONAA 84* 07/24/2014 0431   GFRAA >90 07/24/2014 0431    Assessment and Plan  Dementia without behavioral disturbance Pt is failing to thrive quickly; PT notes big decline  from last week to this week, in terms of activity.  ST notes pt sucks when fed, doesn't eat or drink much.   Intertrochanteric fracture of left femur Pt is progressing very poorly in OT/PT.  HTN (hypertension) Controlled on metoprolol  Acute blood loss anemia Continue iron and B12    Margit Hanks, MD

## 2014-08-19 ENCOUNTER — Encounter: Payer: Self-pay | Admitting: Internal Medicine

## 2014-08-19 NOTE — Assessment & Plan Note (Signed)
Pt is progressing very poorly in OT/PT.

## 2014-08-19 NOTE — Assessment & Plan Note (Signed)
Continue iron and B12

## 2014-08-19 NOTE — Assessment & Plan Note (Signed)
Controlled on metoprolol.

## 2014-10-16 ENCOUNTER — Emergency Department (HOSPITAL_COMMUNITY)
Admission: EM | Admit: 2014-10-16 | Discharge: 2014-10-17 | Disposition: A | Discharge status: Home / Self Care | Payer: Medicare HMO | Attending: Emergency Medicine | Admitting: Emergency Medicine

## 2014-10-16 ENCOUNTER — Emergency Department (HOSPITAL_COMMUNITY): Payer: Medicare HMO

## 2014-10-16 ENCOUNTER — Encounter (HOSPITAL_COMMUNITY): Payer: Self-pay | Admitting: Emergency Medicine

## 2014-10-16 DIAGNOSIS — Y939 Activity, unspecified: Secondary | ICD-10-CM | POA: Insufficient documentation

## 2014-10-16 DIAGNOSIS — Y929 Unspecified place or not applicable: Secondary | ICD-10-CM | POA: Insufficient documentation

## 2014-10-16 DIAGNOSIS — F0390 Unspecified dementia without behavioral disturbance: Secondary | ICD-10-CM | POA: Diagnosis not present

## 2014-10-16 DIAGNOSIS — I2510 Atherosclerotic heart disease of native coronary artery without angina pectoris: Secondary | ICD-10-CM | POA: Insufficient documentation

## 2014-10-16 DIAGNOSIS — I10 Essential (primary) hypertension: Secondary | ICD-10-CM | POA: Diagnosis not present

## 2014-10-16 DIAGNOSIS — F419 Anxiety disorder, unspecified: Secondary | ICD-10-CM | POA: Insufficient documentation

## 2014-10-16 DIAGNOSIS — Z8739 Personal history of other diseases of the musculoskeletal system and connective tissue: Secondary | ICD-10-CM | POA: Insufficient documentation

## 2014-10-16 DIAGNOSIS — W1839XA Other fall on same level, initial encounter: Secondary | ICD-10-CM | POA: Diagnosis not present

## 2014-10-16 DIAGNOSIS — Z8639 Personal history of other endocrine, nutritional and metabolic disease: Secondary | ICD-10-CM | POA: Diagnosis not present

## 2014-10-16 DIAGNOSIS — S79911A Unspecified injury of right hip, initial encounter: Secondary | ICD-10-CM | POA: Diagnosis not present

## 2014-10-16 DIAGNOSIS — Y998 Other external cause status: Secondary | ICD-10-CM | POA: Insufficient documentation

## 2014-10-16 DIAGNOSIS — Z79899 Other long term (current) drug therapy: Secondary | ICD-10-CM | POA: Insufficient documentation

## 2014-10-16 DIAGNOSIS — Z7982 Long term (current) use of aspirin: Secondary | ICD-10-CM | POA: Diagnosis not present

## 2014-10-16 DIAGNOSIS — Z87891 Personal history of nicotine dependence: Secondary | ICD-10-CM | POA: Insufficient documentation

## 2014-10-16 DIAGNOSIS — D649 Anemia, unspecified: Secondary | ICD-10-CM | POA: Diagnosis not present

## 2014-10-16 DIAGNOSIS — W19XXXA Unspecified fall, initial encounter: Secondary | ICD-10-CM

## 2014-10-16 NOTE — ED Provider Notes (Signed)
CSN: [id number]     Arrival date & time 10/16/14  2001 History   First MD Initiated Contact with Patient 10/16/14 2137     Chief Complaint  Patient presents with  . Fall  . Hip Pain    HPI: Pamela Blackburn is a 78 year old Caucasian female with PMH of Left Femur Fracture (Repaired 07/20/2014), Severe Dementia, Osteoporosis, HTN, and CAD who presets to the ED on 10/16/2014 following an unwitnessed fall [facility]. The patient is unable to provide any history or details surrounding the fall due to her severe dementia. According to medical records, she was found lying on a hardwood floor and expressed having pain along her left hip.  According to records, the patient underwent surgery by Dr. Eulah PontMurphy on 07/20/2014 and had an intramedullary nail placed in her trochanter to repair an intertrochanteric fracture she sustained from a fall. Since the surgery, records by the nursing facility provider show her physical and mental abilities have significantly declined over the past few months.   Past Medical History  Diagnosis Date  . Anemia   . Osteoporosis   . Coronary artery disease   . Dementia   . Hypertension   . Leukocytosis 07/26/2012  . Hyperlipidemia 07/26/2012  . Pernicious anemia 07/26/2012  . HTN (hypertension) 07/26/2012  . CAD (coronary artery disease) 07/26/2012    Cath 12/26/05: LAD 10-15% mid stenosis and distally, LCIRC small and patent, RCA 15-20% mid stenosis. EF 45%, LV with mild anterolateral and mid inferior wall hypokinesia  Myoview : 12/13/2009: No evidence of inducible reversible ischemia EF 77%  . Anxiety 07/26/2012  . History of tobacco use 07/26/2012  . Hyperparathyroidism 07/26/2012  . Vitamin D deficiency 07/26/2012   Past Surgical History  Procedure Laterality Date  . Tonsillectomy    . Intramedullary (im) nail intertrochanteric Left 07/20/2014    Procedure: INTRAMEDULLARY (IM) NAIL INTERTROCHANTRIC;  Surgeon: Sheral Apleyimothy D Murphy, MD;  Location: [facility] OR;  Service:  Orthopedics;  Laterality: Left;   Family History  Problem Relation Age of Onset  . Breast cancer Mother   . Lung cancer Father    History  Substance Use Topics  . Smoking status: Former Smoker -- 0.50 packs/day    Types: Cigarettes  . Smokeless tobacco: Not on file  . Alcohol Use: No   OB History    No data available     Review of Systems: Unable to be obtained from the patient due to her severe dementia.  Allergies  Review of patient's allergies indicates no known allergies.  Home Medications   Prior to Admission medications   Medication Sig Start Date End Date Taking? Authorizing Provider  CALCIUM PO Take 1 tablet by mouth 2 (two) times daily.   Yes Historical Provider, MD  docusate sodium (COLACE) 100 MG capsule Take 1 capsule (100 mg total) by mouth 2 (two) times daily. Continue this while taking narcotics to help with bowel movements 07/20/14  Yes Sheral Apleyimothy D Murphy, MD  ferrous sulfate 325 (65 FE) MG tablet Take 325 mg by mouth daily with breakfast.   Yes Historical Provider, MD  HYDROcodone-acetaminophen (NORCO) 5-325 MG per tablet Take one tablet by mouth every 4 hours as needed for moderate pain; Take two tablets by mouth every 4 hours as needed for severe pain Patient taking differently: Take 1 tablet by mouth every 12 (twelve) hours.  07/25/14  Yes Mahima Pandey, MD  LORazepam (ATIVAN) 0.5 MG tablet Take 1 tablet (0.5 mg total) by mouth every  12 (twelve) hours. Hold for sedation 07/25/14  Yes Mahima Pandey, MD  metoprolol (LOPRESSOR) 50 MG tablet Take 50 mg by mouth daily.   Yes Historical Provider, MD  polyethylene glycol (MIRALAX / GLYCOLAX) packet Take 17 g by mouth daily. 07/24/14  Yes Christiane Ha, MD  QUEtiapine (SEROQUEL) 25 MG tablet Take 25 mg by mouth 2 (two) times daily.   Yes Historical Provider, MD  senna-docusate (SENOKOT-S) 8.6-50 MG per tablet Take 2 tablets by mouth at bedtime. 07/24/14  Yes Christiane Ha, MD  vitamin B-12 (CYANOCOBALAMIN) 1000 MCG  tablet Take 1,000 mcg by mouth daily.   Yes Historical Provider, MD  acetaminophen (TYLENOL) 325 MG tablet Take 2 tablets (650 mg total) by mouth every 6 (six) hours as needed for mild pain (or Fever >/= 101). 07/24/14   Christiane Ha, MD  aspirin EC 325 MG tablet Take 1 tablet (325 mg total) by mouth daily. For 1 month 07/24/14   Christiane Ha, MD  bisacodyl (DULCOLAX) 10 MG suppository Place 1 suppository (10 mg total) rectally daily as needed for moderate constipation. 07/24/14   Christiane Ha, MD  QUEtiapine (SEROQUEL) 50 MG tablet Take 0.5 tablets (25 mg total) by mouth 2 (two) times daily. Hold for sedation 07/24/14   Christiane Ha, MD  sodium phosphate (FLEET) 7-19 GM/118ML ENEM Place 133 mLs (1 enema total) rectally daily as needed for severe constipation. 07/24/14   Christiane Ha, MD   BP 110/66 mmHg  Pulse 86  Temp(Src) 98.3 F (36.8 C) (Oral)  Resp 18  SpO2 97% Physical Exam  Constitutional: No distress.  Patient is non-conversational throughout exam. Lying comfortably in hospital bed.  HENT:  Head: Normocephalic and atraumatic.  Nose: Nose normal.  Mouth/Throat: Oropharynx is clear and moist. No oropharyngeal exudate.  Eyes: Conjunctivae are normal. Pupils are equal, round, and reactive to light. Right eye exhibits no discharge. Left eye exhibits no discharge.  Neck: Neck supple. No JVD present. No thyromegaly present.  Cardiovascular: Normal rate, regular rhythm, normal heart sounds and intact distal pulses.  Exam reveals no gallop and no friction rub.   No murmur heard. Pulmonary/Chest: Effort normal and breath sounds normal. No stridor. No respiratory distress. She has no wheezes. She has no rales. She exhibits no tenderness.  Abdominal: Soft. Bowel sounds are normal. She exhibits no distension and no mass. There is no tenderness. There is no rebound and no guarding.  Musculoskeletal: Normal range of motion. She exhibits no edema or tenderness.  No  response elicited to palpation of right hip or left hip. Active ROM of hips limited, but equal bilaterally. Unable to test passive ROM due to patient being unable to follow commands.  Lymphadenopathy:    She has no cervical adenopathy.  Neurological:  A&O x 0.  Skin: Skin is warm and dry. No rash noted. She is not diaphoretic. No erythema. No pallor.  Psychiatric:  Patient constantly repeated the word "Baby" throughout exam but was unable to respond to questions.  Nursing note and vitals reviewed.   ED Course  Procedures (including critical care time) Labs Review Labs Reviewed - No data to display  Imaging Review Dg Hip Complete Left  10/16/2014   CLINICAL DATA:  Fall with hip pain.  Initial encounter  EXAM: LEFT HIP - COMPLETE 2+ VIEW  COMPARISON:  07/20/2014 left femur x-ray.  FINDINGS: Status post bilateral internal fixation of intertrochanteric femur fractures. The fracture on the left is recent, with signs of  healing since 07/20/2014. Visualized hardware is intact. There is no periprosthetic fracture or dislocation. The pelvic ring appears intact, as permitted by prominent gas and stool over the lower abdomen and pelvis. Osteopenia.  IMPRESSION: No acute osseous findings.   Electronically Signed   By: Tiburcio PeaJonathan  Watts M.D.   On: 10/16/2014 21:39   Patient had a fall at the nursing facility complaining of pain to her left hip.  Patient denied any complaints on my exam, but she does have severe dementia    Carlyle DollyChristopher W Latonia Conrow, PA-C 10/18/14 0130  Toy BakerAnthony T Allen, MD 10/18/14 1017

## 2014-10-16 NOTE — ED Notes (Signed)
[facility] ([facility] [address]) after her unwitnessed fall tonight.  It was reported that pt was found lying on a hardwood floor--- pt has had immediate pain to left hip after the fall.  Pt was observed to have internal rotation of left leg on EMS' arrival.  Pt has had previous surgery of the affected hip.

## 2014-10-16 NOTE — ED Notes (Signed)
Bed: [id number] Expected date: 10/16/14 Expected time: 7:51 PM Means of arrival: Ambulance Comments: 78 yo F  Fall/L hip pain

## 2014-10-16 NOTE — Discharge Instructions (Signed)
Follow up with her doctor.Return here as needed.

## 2014-10-24 ENCOUNTER — Non-Acute Institutional Stay (SKILLED_NURSING_FACILITY): Payer: Medicare HMO | Admitting: Adult Health

## 2014-10-24 DIAGNOSIS — K5900 Constipation, unspecified: Secondary | ICD-10-CM

## 2014-10-24 DIAGNOSIS — D62 Acute posthemorrhagic anemia: Secondary | ICD-10-CM

## 2014-10-24 DIAGNOSIS — I10 Essential (primary) hypertension: Secondary | ICD-10-CM

## 2014-10-24 DIAGNOSIS — F29 Unspecified psychosis not due to a substance or known physiological condition: Secondary | ICD-10-CM

## 2014-10-24 DIAGNOSIS — S72142S Displaced intertrochanteric fracture of left femur, sequela: Secondary | ICD-10-CM

## 2014-10-24 DIAGNOSIS — F0390 Unspecified dementia without behavioral disturbance: Secondary | ICD-10-CM

## 2014-10-25 ENCOUNTER — Encounter: Payer: Self-pay | Admitting: Internal Medicine

## 2014-10-25 ENCOUNTER — Non-Acute Institutional Stay (SKILLED_NURSING_FACILITY): Payer: Medicare HMO | Admitting: Internal Medicine

## 2014-10-25 DIAGNOSIS — I10 Essential (primary) hypertension: Secondary | ICD-10-CM | POA: Diagnosis not present

## 2014-10-25 DIAGNOSIS — S72142D Displaced intertrochanteric fracture of left femur, subsequent encounter for closed fracture with routine healing: Secondary | ICD-10-CM

## 2014-10-25 DIAGNOSIS — D62 Acute posthemorrhagic anemia: Secondary | ICD-10-CM | POA: Diagnosis not present

## 2014-10-25 DIAGNOSIS — F0390 Unspecified dementia without behavioral disturbance: Secondary | ICD-10-CM

## 2014-10-25 DIAGNOSIS — W19XXXD Unspecified fall, subsequent encounter: Secondary | ICD-10-CM | POA: Diagnosis not present

## 2014-10-25 DIAGNOSIS — E213 Hyperparathyroidism, unspecified: Secondary | ICD-10-CM | POA: Diagnosis not present

## 2014-10-25 DIAGNOSIS — F29 Unspecified psychosis not due to a substance or known physiological condition: Secondary | ICD-10-CM | POA: Insufficient documentation

## 2014-10-25 DIAGNOSIS — K5900 Constipation, unspecified: Secondary | ICD-10-CM | POA: Insufficient documentation

## 2014-10-25 NOTE — Progress Notes (Signed)
Patient ID: Pamela Blackburn, female   DOB: 05-Dec-1935, 78 y.o.   MRN: [id number]  [facility]     No Known Allergies     Chief Complaint  Patient presents with  . Medical Management of Chronic Issues    HPI:  She is a long term resident of this facility being seen for the management of her chronic illnesses. She is unable to participate in the hpi or ros. Per nursing staff there is little change in her status. There are no concerns being voiced at this time.    Past Medical History  Diagnosis Date  . Anemia   . Osteoporosis   . Coronary artery disease   . Dementia   . Hypertension   . Leukocytosis 07/26/2012  . Hyperlipidemia 07/26/2012  . Pernicious anemia 07/26/2012  . HTN (hypertension) 07/26/2012  . CAD (coronary artery disease) 07/26/2012    Cath 12/26/05: LAD 10-15% mid stenosis and distally, LCIRC small and patent, RCA 15-20% mid stenosis. EF 45%, LV with mild anterolateral and mid inferior wall hypokinesia  Myoview : 12/13/2009: No evidence of inducible reversible ischemia EF 77%  . Anxiety 07/26/2012  . History of tobacco use 07/26/2012  . Hyperparathyroidism 07/26/2012  . Vitamin D deficiency 07/26/2012    Past Surgical History  Procedure Laterality Date  . Tonsillectomy    . Intramedullary (im) nail intertrochanteric Left 07/20/2014    Procedure: INTRAMEDULLARY (IM) NAIL INTERTROCHANTRIC;  Surgeon: Sheral Apleyimothy D Murphy, MD;  Location: [facility] OR;  Service: Orthopedics;  Laterality: Left;    VITAL SIGNS BP 100/58 mmHg  Pulse 84  Ht 5\' 1"  (1.549 m)  Wt 104 lb (47.174 kg)  BMI 19.66 kg/m2  SpO2 97%   Outpatient Encounter Prescriptions as of 10/24/2014  Medication Sig  . acetaminophen (TYLENOL) 325 MG tablet Take 2 tablets (650 mg total) by mouth every 6 (six) hours as needed for mild pain (or Fever >/= 101).  Marland Kitchen. aspirin EC 325 MG tablet Take 1 tablet (325 mg total) by mouth daily. For 1 month  . bisacodyl (DULCOLAX) 10 MG suppository Place 1 suppository (10 mg total) rectally daily as  needed for moderate constipation.  Marland Kitchen. CALCIUM PO Take 1 tablet by mouth 2 (two) times daily.  Marland Kitchen. docusate sodium (COLACE) 100 MG capsule Take 1 capsule (100 mg total) by mouth 2 (two) times daily. Continue this while taking narcotics to help with bowel movements  . ferrous sulfate 325 (65 FE) MG tablet Take 325 mg by mouth daily with breakfast.  . HYDROcodone-acetaminophen (NORCO) 5-325 MG per tablet Take one tablet by mouth every 4 hours as needed for moderate pain; Take two tablets by mouth every 4 hours as needed for severe pain (Patient taking differently: Take 1 tablet by mouth every 12 (twelve) hours. )  . LORazepam (ATIVAN) 0.5 MG tablet Take 1 tablet (0.5 mg total) by mouth every 12 (twelve) hours. Hold for sedation  . metoprolol (LOPRESSOR) 50 MG tablet Take 50 mg by mouth daily.  . polyethylene glycol (MIRALAX / GLYCOLAX) packet Take 17 g by mouth daily.  . QUEtiapine (SEROQUEL) 25 MG tablet Take 25 mg by mouth 2 (two) times daily.  . QUEtiapine (SEROQUEL) 50 MG tablet Take 0.5 tablets (25 mg total) by mouth 2 (two) times daily. Hold for sedation  . senna-docusate (SENOKOT-S) 8.6-50 MG per tablet Take 2 tablets by mouth at bedtime.  . sodium phosphate (FLEET) 7-19 GM/118ML ENEM Place 133 mLs (1 enema total) rectally daily as needed for severe constipation.  .Marland Kitchen  vitamin B-12 (CYANOCOBALAMIN) 1000 MCG tablet Take 1,000 mcg by mouth daily.     SIGNIFICANT DIAGNOSTIC EXAMS  09-17-14: chest x-ray; rotated kyphotic projection hyperinflation with clear lungs   LABS REVIEWED:   07-24-14: wbc 7.0; hgb 10.1; hct 30.5; mcv 87.6; plt 239; glucose 109; bun 15; creat 0.67; k+3.9; na++135; ca++10.6     Review of Systems  Unable to perform ROS    Physical Exam  Constitutional: No distress.  frail  Neck: Neck supple. No JVD present. No thyromegaly present.  Cardiovascular: Normal rate, regular rhythm and intact distal pulses.   Respiratory: Effort normal and breath sounds normal. No  respiratory distress. She has no wheezes.  GI: Soft. Bowel sounds are normal. She exhibits no distension.  Musculoskeletal: She exhibits no edema.  Is able to move all extremities   Neurological: She is alert.  Skin: Skin is warm and dry. She is not diaphoretic.       ASSESSMENT/ PLAN:  1. Hypertension: will continue lopressor 50 mg daily; will have nursing check blood pressure and pulse for one week and report.  2. Constipation; will continue colace twice daily; miralax daily; and senna s 2 tabs nightly   3. Anemia: will continue iron daily   4. Status post left hip fracture from August 2015: no change in recent status; is on long term pan medication; vicodin 5.325 mg twice daily and will monitor   5. Psychosis: does talk to herself; is unable answer questions; no recent reports of behavioral issues present; will continue seroquel 25 mg twice daily and will continue ativan 0.5 mg twice daily for anxiety and will monitor her status.   6. Dementia without behavioral disturbance: no recent change in status; is presently not on medications; will not make changes will monitor   7. Hypercalcemia: her last level August 2015 was 10.6. Her goals of care is comfort with her no hospitalization and dnr status.     Synthia Innocenteborah Green NP [facility]  Contact [phone] Monday through Friday 8am- 5pm  After hours call [phone]

## 2014-10-25 NOTE — Progress Notes (Signed)
Patient ID: Pamela BashLinda B Nepomuceno, female   DOB: 1936/11/13, 78 y.o.   MRN: [id number]  [facility] Provider:  Gwenith Spitziffany L. Renato Gailseed, D.O., C.M.D.  Code Status:  DNR, has MOST completed by her son on chart  Chief Complaint  Patient presents with  . Medical Management of Chronic Issues    HPI:  78 yo female with dementia here for rehab s/p hospitalization for left hip fx--underwent intramedullary nailing by Dr. Eulah PontMurphy on 07/20/14.  Had a recent fall 11/23 that took her to the ED.  Has broken both hips--right in 9/13 and left 8/15.  She is not on ca with D due to hyperparathyroidism apparently.    Review of Systems:  Review of Systems  Constitutional: Negative for fever and chills.  Respiratory: Negative for shortness of breath.   Cardiovascular: Negative for chest pain.  Gastrointestinal: Positive for constipation. Negative for abdominal pain.  Genitourinary: Negative for dysuria.  Musculoskeletal: Positive for joint pain and falls.  Neurological: Negative for dizziness and headaches.  Endo/Heme/Allergies:       Hyperparathyroidism  Psychiatric/Behavioral: Positive for memory loss.    Medications: Patient's Medications  New Prescriptions   No medications on file  Previous Medications   ACETAMINOPHEN (TYLENOL) 325 MG TABLET    Take 2 tablets (650 mg total) by mouth every 6 (six) hours as needed for mild pain (or Fever >/= 101).   ASPIRIN EC 325 MG TABLET    Take 1 tablet (325 mg total) by mouth daily. For 1 month   BISACODYL (DULCOLAX) 10 MG SUPPOSITORY    Place 1 suppository (10 mg total) rectally daily as needed for moderate constipation.   CALCIUM PO    Take 1 tablet by mouth 2 (two) times daily.   DOCUSATE SODIUM (COLACE) 100 MG CAPSULE    Take 1 capsule (100 mg total) by mouth 2 (two) times daily. Continue this while taking narcotics to help with bowel movements   FERROUS SULFATE 325 (65 FE) MG TABLET    Take 325 mg by mouth daily with breakfast.   HYDROCODONE-ACETAMINOPHEN (NORCO) 5-325 MG PER TABLET    Take one tablet by mouth every 4 hours as needed for moderate pain; Take two tablets by mouth every 4 hours as needed for severe pain   LORAZEPAM (ATIVAN) 0.5 MG TABLET    Take 1 tablet (0.5 mg total) by mouth every 12 (twelve) hours. Hold for sedation   METOPROLOL (LOPRESSOR) 50 MG TABLET    Take 50 mg by mouth daily.   POLYETHYLENE GLYCOL (MIRALAX / GLYCOLAX) PACKET    Take 17 g by mouth daily.   QUETIAPINE (SEROQUEL) 25 MG TABLET    Take 25 mg by mouth 2 (two) times daily.   QUETIAPINE (SEROQUEL) 50 MG TABLET    Take 0.5 tablets (25 mg total) by mouth 2 (two) times daily. Hold for sedation   SENNA-DOCUSATE (SENOKOT-S) 8.6-50 MG PER TABLET    Take 2 tablets by mouth at bedtime.   SODIUM PHOSPHATE (FLEET) 7-19 GM/118ML ENEM    Place 133 mLs (1 enema total) rectally daily as needed for severe constipation.   VITAMIN B-12 (CYANOCOBALAMIN) 1000 MCG TABLET    Take 1,000 mcg by mouth daily.  Modified Medications   No medications on file  Discontinued Medications   No medications on file    Physical Exam: Filed Vitals:   10/25/14 1338  BP: 131/71  Pulse: 92  Temp: 98.1 F (36.7 C)  Resp: 18  Height: 5'  1" (1.549 m)  Weight: 104 lb (47.174 kg)  SpO2: 97%  Physical Exam  Constitutional: No distress.  HENT:  Head: Normocephalic and atraumatic.  Eyes: EOM are normal. Pupils are equal, round, and reactive to light.  Neck: Neck supple.  Cardiovascular: Normal rate, regular rhythm and normal heart sounds.   Pulmonary/Chest: Effort normal and breath sounds normal.  Abdominal: Soft. Bowel sounds are normal. She exhibits no distension and no mass. There is no tenderness.  Musculoskeletal: She exhibits tenderness.  kyphoscoliosis  Neurological: She is alert.  Skin: Skin is warm and dry.  Psychiatric: She has a normal mood and affect.     Labs reviewed: Basic Metabolic Panel:  Recent Labs  16/08/9607/28/15 0545 07/22/14 0410  07/24/14 0431  NA 140 137 135*  K 3.6* 4.0 3.9  CL 105 103 99  CO2 27 25 26   GLUCOSE 115* 118* 109*  BUN 8 9 15   CREATININE 0.61 0.59 0.67  CALCIUM 9.8 9.8 10.6*    Liver Function Tests:  Recent Labs  07/19/14 0108  AST 21  ALT 12  ALKPHOS 149*  BILITOT 0.3  PROT 7.4  ALBUMIN 3.7    CBC:  Recent Labs  07/19/14 0108 07/21/14 0545 07/22/14 0410 07/23/14 0630 07/24/14 0431  WBC 12.9* 6.5 5.7  --  7.0  NEUTROABS 11.0*  --   --   --   --   HGB 12.5 9.0* 7.8* 10.4* 10.1*  HCT 37.5 26.7* 23.7* 31.1* 30.5*  MCV 86.8 87.5 85.9  --  87.6  PLT 232 180 184  --  239   CXR rotate kyphotic projection, hyperinflation   Assessment/Plan 1. Dementia without behavioral disturbance -not on dementia meds--unclear where things stand with this -is on seroquel which we will need to try to taper her off of--consider optimization of aricept and namenda to help with agitation/psychosis -also on ativan which is not helping her falling either and will need to be tapered off  2. Intertrochanteric fracture of left femur, closed, with routine healing, subsequent encounter -in August, but since had another fall in November that landed her in the hospital -here for rehab now  3. Hyperparathyroidism -f/u PTH, phos, vitamin D, iCa levels Is on ca alone at present  4. Essential hypertension -bp at goal with lopressor only  5. Acute blood loss anemia -cont iron daily, f/u cbc; would not give more than once a day due to constipation difficulties  6. Fall, subsequent encounter -need to do our best to prevent falls -she is at risk due to historical fall, dementia itself with decreasing ability to ambulate, instability due to prior fxs, meds:  Ativan, seroquel, possibly lopressor if gets brady or orthostatic, b12 deficiency (is on supplement)--need f/u level -to work with pt, ot  Scientist, forensicamily/ staff Communication: discussed with nurse  Goals of care: at least short term rehab, but could become  long term care resident due to her dementia which will limit her ability to make great progress with therapy  Labs/tests ordered:  B12, cbc, PTH, iCa, phos, vit D

## 2014-12-06 ENCOUNTER — Non-Acute Institutional Stay (INDEPENDENT_AMBULATORY_CARE_PROVIDER_SITE_OTHER): Payer: Medicare HMO | Admitting: Adult Health

## 2014-12-06 DIAGNOSIS — E213 Hyperparathyroidism, unspecified: Secondary | ICD-10-CM

## 2014-12-06 DIAGNOSIS — F0390 Unspecified dementia without behavioral disturbance: Secondary | ICD-10-CM

## 2014-12-06 DIAGNOSIS — D649 Anemia, unspecified: Secondary | ICD-10-CM

## 2014-12-06 DIAGNOSIS — K5900 Constipation, unspecified: Secondary | ICD-10-CM

## 2014-12-06 DIAGNOSIS — I10 Essential (primary) hypertension: Secondary | ICD-10-CM

## 2014-12-06 DIAGNOSIS — S72142S Displaced intertrochanteric fracture of left femur, sequela: Secondary | ICD-10-CM

## 2014-12-06 DIAGNOSIS — F29 Unspecified psychosis not due to a substance or known physiological condition: Secondary | ICD-10-CM

## 2014-12-11 ENCOUNTER — Other Ambulatory Visit: Payer: Self-pay | Admitting: *Deleted

## 2014-12-11 MED ORDER — HYDROCODONE-ACETAMINOPHEN 5-325 MG PO TABS: ORAL_TABLET | ORAL | Status: DC

## 2014-12-11 NOTE — Telephone Encounter (Signed)
[facility] 

## 2014-12-26 ENCOUNTER — Encounter: Payer: Self-pay | Admitting: Adult Health

## 2014-12-26 NOTE — Progress Notes (Signed)
Patient ID: Pamela Blackburn, female   DOB: 12-May-1936, 79 y.o.   MRN: [id number]  [facility]      No Known Allergies     Chief Complaint  Patient presents with  . Medical Management of Chronic Issues    HPI:  She is a long term resident of this facility being seen for the management of her chronic illnesses. Overall there is little change in her status. Her pth is 160.3; she is not on medications; due to her quality of life watchful monitor is more appropriate approach at this time. She is unable to participate in the hpi or ros. There are no nursing concerns at this time.    Past Medical History  Diagnosis Date  . Anemia   . Osteoporosis   . Coronary artery disease   . Dementia   . Hypertension   . Leukocytosis 07/26/2012  . Hyperlipidemia 07/26/2012  . Pernicious anemia 07/26/2012  . HTN (hypertension) 07/26/2012  . CAD (coronary artery disease) 07/26/2012    Cath 12/26/05: LAD 10-15% mid stenosis and distally, LCIRC small and patent, RCA 15-20% mid stenosis. EF 45%, LV with mild anterolateral and mid inferior wall hypokinesia  Myoview : 12/13/2009: No evidence of inducible reversible ischemia EF 77%  . Anxiety 07/26/2012  . History of tobacco use 07/26/2012  . Hyperparathyroidism 07/26/2012  . Vitamin D deficiency 07/26/2012    Past Surgical History  Procedure Laterality Date  . Tonsillectomy    . Intramedullary (im) nail intertrochanteric Left 07/20/2014    Procedure: INTRAMEDULLARY (IM) NAIL INTERTROCHANTRIC;  Surgeon: Sheral Apley, MD;  Location: [facility] OR;  Service: Orthopedics;  Laterality: Left;    VITAL SIGNS BP 119/90 mmHg  Pulse 66  Ht  (1.549 m)  Wt 101 lb (45.813 kg)  BMI 19.09 kg/m2  SpO2 97%   Outpatient Encounter Prescriptions as of 12/06/2014  Medication Sig  . acetaminophen (TYLENOL) 325 MG tablet Take 2 tablets (650 mg total) by mouth every 6 (six) hours as needed for mild pain (or Fever >/= 101).  Marland Kitchen aspirin EC 325 MG tablet Take 1 tablet (325 mg total) by  mouth daily. For 1 month  . bisacodyl (DULCOLAX) 10 MG suppository Place 1 suppository (10 mg total) rectally daily as needed for moderate constipation.  Marland Kitchen CALCIUM PO Take 1 tablet by mouth 2 (two) times daily.  Marland Kitchen docusate sodium (COLACE) 100 MG capsule Take 1 capsule (100 mg total) by mouth 2 (two) times daily. Continue this while taking narcotics to help with bowel movements  . ferrous sulfate 325 (65 FE) MG tablet Take 325 mg by mouth daily with breakfast.  . HYDROcodone-acetaminophen (NORCO) 5-325 MG per tablet Take one tablet by mouth every 12 hours for pain. Not to exceed  of APAP from all sources/24h  . LORazepam (ATIVAN) 0.5 MG tablet Take 1 tablet (0.5 mg total) by mouth every 12 (twelve) hours. Hold for sedation  . metoprolol (LOPRESSOR) 50 MG tablet Take 50 mg by mouth daily.  . polyethylene glycol (MIRALAX / GLYCOLAX) packet Take 17 g by mouth daily.  . QUEtiapine (SEROQUEL) 25 MG tablet Take 25 mg by mouth 2 (two) times daily.  . QUEtiapine (SEROQUEL) 50 MG tablet Take 0.5 tablets (25 mg total) by mouth 2 (two) times daily. Hold for sedation  . senna-docusate (SENOKOT-S) 8.6-50 MG per tablet Take 2 tablets by mouth at bedtime.  . sodium phosphate (FLEET) 7-19 GM/118ML ENEM Place 133 mLs (1 enema total) rectally daily as needed for severe  constipation.  . vitamin B-12 (CYANOCOBALAMIN) 1000 MCG tablet Take 1,000 mcg by mouth daily.     SIGNIFICANT DIAGNOSTIC EXAMS   09-17-14: chest x-ray; rotated kyphotic projection hyperinflation with clear lungs   LABS REVIEWED:   07-24-14: wbc 7.0; hgb 10.1; hct 30.5; mcv 87.6; plt 239; glucose 109; bun 15; creat 0.67; k+3.9; na++135; ca++10.6  10-26-14: wbc 7.0; hgb 13.2; hct 42.5; mcv 882; plt 300; glucose 94; bun 13; creat 0.63; k+3.9; na++143; ca++11.1; PTH 160.3     Review of Systems  Unable to perform ROS    Physical Exam Constitutional: No distress.  frail  Neck: Neck supple. No JVD present. No thyromegaly present.    Cardiovascular: Normal rate, regular rhythm and intact distal pulses.   Respiratory: Effort normal and breath sounds normal. No respiratory distress. She has no wheezes.  GI: Soft. Bowel sounds are normal. She exhibits no distension.  Musculoskeletal: She exhibits no edema.  Is able to move all extremities   Neurological: She is alert.  Skin: Skin is warm and dry. She is not diaphoretic.    ASSESSMENT/ PLAN:  1. Hypertension: will continue lopressor 50 mg daily; will have nursing check blood pressure and pulse for one week and report.  2. Constipation; will continue colace twice daily; miralax daily; and senna s 2 tabs nightly   3. Anemia: will continue iron daily her hgb is 13.2; may need to consider stopping this medication.   4. Status post left hip fracture from August 2015: no change in recent status; is on long term pan medication; vicodin 5/325 mg twice daily and will monitor   5. Psychosis: does talk to herself; is unable answer questions; no recent reports of behavioral issues present; will continue seroquel 25 mg twice daily and will continue ativan 0.5 mg twice daily for anxiety and will monitor her status.   6. Dementia without behavioral disturbance: no recent change in status; is presently not on medications; will not make changes will monitor   7. Hypercalcemia: her calcium level is 11.1 . Her goals of care is comfort with her no hospitalization and dnr status.   8.  Hyperparathyroidism: Her PTH: is 160.3. She is not on medications; will not make changes will continue to monitor her status.   Pamela [name] NP [facility]  Contact [phone] Monday through Friday 8am- 5pm  After hours call [phone]

## 2015-01-03 ENCOUNTER — Other Ambulatory Visit: Payer: Self-pay | Admitting: *Deleted

## 2015-01-03 MED ORDER — HYDROCODONE-ACETAMINOPHEN 5-325 MG PO TABS: ORAL_TABLET | ORAL | Status: DC

## 2015-01-03 NOTE — Telephone Encounter (Signed)
[facility] 

## 2015-01-04 ENCOUNTER — Other Ambulatory Visit: Payer: Self-pay | Admitting: *Deleted

## 2015-01-04 MED ORDER — HYDROCODONE-ACETAMINOPHEN 5-325 MG PO TABS: ORAL_TABLET | ORAL | Status: DC

## 2015-01-04 NOTE — Telephone Encounter (Signed)
[facility] 

## 2015-01-22 ENCOUNTER — Other Ambulatory Visit: Payer: Self-pay | Admitting: *Deleted

## 2015-01-22 MED ORDER — HYDROCODONE-ACETAMINOPHEN 5-325 MG PO TABS: ORAL_TABLET | ORAL | Status: DC

## 2015-01-22 NOTE — Telephone Encounter (Signed)
[facility] #[phone] Fax [phone] 

## 2015-01-24 ENCOUNTER — Non-Acute Institutional Stay (SKILLED_NURSING_FACILITY): Payer: Medicare HMO | Admitting: Adult Health

## 2015-01-24 DIAGNOSIS — E213 Hyperparathyroidism, unspecified: Secondary | ICD-10-CM | POA: Diagnosis not present

## 2015-01-24 DIAGNOSIS — S72141S Displaced intertrochanteric fracture of right femur, sequela: Secondary | ICD-10-CM

## 2015-01-24 DIAGNOSIS — F29 Unspecified psychosis not due to a substance or known physiological condition: Secondary | ICD-10-CM | POA: Diagnosis not present

## 2015-01-24 DIAGNOSIS — K5900 Constipation, unspecified: Secondary | ICD-10-CM | POA: Diagnosis not present

## 2015-01-24 DIAGNOSIS — I10 Essential (primary) hypertension: Secondary | ICD-10-CM | POA: Diagnosis not present

## 2015-02-13 ENCOUNTER — Other Ambulatory Visit: Payer: Self-pay | Admitting: *Deleted

## 2015-02-13 MED ORDER — HYDROCODONE-ACETAMINOPHEN 5-325 MG PO TABS: ORAL_TABLET | ORAL | Status: DC

## 2015-02-13 NOTE — Telephone Encounter (Signed)
[facility] 

## 2015-02-28 ENCOUNTER — Encounter: Payer: Self-pay | Admitting: Adult Health

## 2015-02-28 NOTE — Progress Notes (Signed)
Patient ID: Pamela Blackburn, female   DOB: Apr 10, 1936, 79 y.o.   MRN: [id number]  [facility]     No Known Allergies     Chief Complaint  Patient presents with  . Medical Management of Chronic Issues    HPI:  She is a long term resident of this facility being seen for the management of her chronic illnesses. Overall there is little change in her status. She is unable to participate in the hpi or ros. There are no nursing concerns being voiced at this time.    Past Medical History  Diagnosis Date  . Anemia   . Osteoporosis   . Coronary artery disease   . Dementia   . Hypertension   . Leukocytosis 07/26/2012  . Hyperlipidemia 07/26/2012  . Pernicious anemia 07/26/2012  . HTN (hypertension) 07/26/2012  . CAD (coronary artery disease) 07/26/2012    Cath 12/26/05: LAD 10-15% mid stenosis and distally, LCIRC small and patent, RCA 15-20% mid stenosis. EF 45%, LV with mild anterolateral and mid inferior wall hypokinesia  Myoview : 12/13/2009: No evidence of inducible reversible ischemia EF 77%  . Anxiety 07/26/2012  . History of tobacco use 07/26/2012  . Hyperparathyroidism 07/26/2012  . Vitamin D deficiency 07/26/2012    Past Surgical History  Procedure Laterality Date  . Tonsillectomy    . Intramedullary (im) nail intertrochanteric Left 07/20/2014    Procedure: INTRAMEDULLARY (IM) NAIL INTERTROCHANTRIC;  Surgeon: Sheral Apleyimothy D Murphy, MD;  Location: [facility] OR;  Service: Orthopedics;  Laterality: Left;    VITAL SIGNS BP 132/67 mmHg  Pulse 78  Ht 5\' 1"  (1.549 m)  Wt 104 lb (47.174 kg)  BMI 19.66 kg/m2   Outpatient Encounter Prescriptions as of 01/24/2015  Medication Sig  . acetaminophen (TYLENOL) 325 MG tablet Take 2 tablets (650 mg total) by mouth every 6 (six) hours as needed for mild pain (or Fever >/= 101).  Marland Kitchen. aspirin EC 325 MG tablet Take 1 tablet (325 mg total) by mouth daily. For 1 month  . bisacodyl (DULCOLAX) 10 MG suppository Place 1 suppository (10 mg total) rectally daily as needed for  moderate constipation.  . docusate sodium (COLACE) 100 MG capsule Take 1 capsule (100 mg total) by mouth 2 (two) times daily. Continue this while taking narcotics to help with bowel movements  . ferrous sulfate 325 (65 FE) MG tablet Take 325 mg by mouth daily with breakfast.  . HYDROcodone-acetaminophen (NORCO) 5-325 MG per tablet Take one tablet by mouth every 12 hours for pain. Not to exceed 3000mg  of APAP from all sources/24h  . LORazepam (ATIVAN) 0.5 MG tablet Take 1 tablet (0.5 mg total) by mouth every 12 (twelve) hours. Hold for sedation  . metoprolol (LOPRESSOR) 50 MG tablet Take 50 mg by mouth daily.  . polyethylene glycol (MIRALAX / GLYCOLAX) packet Take 17 g by mouth daily.  Marland Kitchen. senna-docusate (SENOKOT-S) 8.6-50 MG per tablet Take 2 tablets by mouth at bedtime.  . sodium phosphate (FLEET) 7-19 GM/118ML ENEM Place 133 mLs (1 enema total) rectally daily as needed for severe constipation.  . vitamin B-12 (CYANOCOBALAMIN) 1000 MCG tablet Take 1,000 mcg by mouth daily.     SIGNIFICANT DIAGNOSTIC EXAMS   09-17-14: chest x-ray; rotated kyphotic projection hyperinflation with clear lungs   LABS REVIEWED:   07-24-14: wbc 7.0; hgb 10.1; hct 30.5; mcv 87.6; plt 239; glucose 109; bun 15; creat 0.67; k+3.9; na++135; ca++10.6  10-26-14: wbc 7.0; hgb 13.2; hct 42.5; mcv 882; plt 300; glucose 94; bun 13; creat  0.63; k+3.9; na++143; ca++11.1; PTH 160.3     ROS Unable to perform ROS    Physical Exam Constitutional: No distress.  frail  Neck: Neck supple. No JVD present. No thyromegaly present.  Cardiovascular: Normal rate, regular rhythm and intact distal pulses.   Respiratory: Effort normal and breath sounds normal. No respiratory distress. She has no wheezes.  GI: Soft. Bowel sounds are normal. She exhibits no distension.  Musculoskeletal: She exhibits no edema.  Is able to move all extremities   Neurological: She is alert.  Skin: Skin is warm and dry. She is not  diaphoretic.    ASSESSMENT/ PLAN:   1. Hypertension: will continue lopressor 50 mg daily; will have nursing check blood pressure and pulse for one week and report.  2. Constipation; will continue colace twice daily; miralax daily; and senna s 2 tabs nightly   3. Anemia: will continue iron daily her hgb is 13.2; may need to consider stopping this medication.   4. Status post left hip fracture from August 2015: no change in recent status; is on long term pan medication; vicodin 5/325 mg twice daily and will monitor   5. Psychosis: does talk to herself; is unable answer questions; no recent reports of behavioral issues present; will continue seroquel 25 mg twice daily and will continue ativan 0.5 mg twice daily for anxiety and will monitor her status.   6. Dementia without behavioral disturbance: no recent change in status; is presently not on medications; will not make changes will monitor   7. Hypercalcemia: her calcium level is 11.1 . Her goals of care is comfort with her no hospitalization and dnr status.   8.  Hyperparathyroidism: Her PTH: is 160.3. She is not on medications; will not make changes will continue to monitor her status.      Synthia Innocent NP [facility]  Contact [phone] Monday through Friday 8am- 5pm  After hours call [phone]

## 2015-03-08 ENCOUNTER — Encounter: Payer: Self-pay | Admitting: Internal Medicine

## 2015-03-08 ENCOUNTER — Non-Acute Institutional Stay (SKILLED_NURSING_FACILITY): Payer: Medicare HMO | Admitting: Internal Medicine

## 2015-03-08 DIAGNOSIS — I10 Essential (primary) hypertension: Secondary | ICD-10-CM | POA: Diagnosis not present

## 2015-03-08 DIAGNOSIS — E785 Hyperlipidemia, unspecified: Secondary | ICD-10-CM | POA: Diagnosis not present

## 2015-03-08 DIAGNOSIS — F0390 Unspecified dementia without behavioral disturbance: Secondary | ICD-10-CM | POA: Diagnosis not present

## 2015-03-08 DIAGNOSIS — F29 Unspecified psychosis not due to a substance or known physiological condition: Secondary | ICD-10-CM

## 2015-03-08 DIAGNOSIS — E213 Hyperparathyroidism, unspecified: Secondary | ICD-10-CM | POA: Diagnosis not present

## 2015-03-08 DIAGNOSIS — D510 Vitamin B12 deficiency anemia due to intrinsic factor deficiency: Secondary | ICD-10-CM

## 2015-03-08 NOTE — Progress Notes (Signed)
Patient ID: Pamela Blackburn, female   DOB: [birth year], 79 y.o.   MRN: [id number]    [place] [facility] Service: SN[facility] [phone])   03/08/15  No Known Allergies  Chief Complaint  Patient presents with  . Medical Management of Chronic Issues    evening agitation    HPI:  79 yo female long term resident seen today for f/u. She has no c/o. She is a poor historian due to her dementia. Hx obtained from nursing and chart. Nursing c/a increased agitation in the PM. She takes seroquel 12.5 mg in AM and qhs for psychosis. She also has lorazepam ordered.  BP controlled on metoprolol  She takes iron and B12 supplements for anemia.  Pain controlled with prn norco. She has bowel meds for constipation   Past Medical History  Diagnosis Date  . Anemia   . Osteoporosis   . Coronary artery disease   . Dementia   . Hypertension   . Leukocytosis 07/26/2012  . Hyperlipidemia 07/26/2012  . Pernicious anemia 07/26/2012  . HTN (hypertension) 07/26/2012  . CAD (coronary artery disease) 07/26/2012    Cath 12/26/05: LAD 10-15% mid stenosis and distally, LCIRC small and patent, RCA 15-20% mid stenosis. EF 45%, LV with mild anterolateral and mid inferior wall hypokinesia  Myoview : 12/13/2009: No evidence of inducible reversible ischemia EF 77%  . Anxiety 07/26/2012  . History of tobacco use 07/26/2012  . Hyperparathyroidism 07/26/2012  . Vitamin D deficiency 07/26/2012   Past Surgical History  Procedure Laterality Date  . Tonsillectomy    . Intramedullary (im) nail intertrochanteric Left 07/20/2014    Procedure: INTRAMEDULLARY (IM) NAIL INTERTROCHANTRIC;  Surgeon: Renette Butters, MD;  Location: [place];  Service: Orthopedics;  Laterality: Left;   History   Social History  . Marital Status: Legally Separated    Spouse Name: N/A  . Number of Children: N/A  . Years of Education: N/A   Social History Main Topics  . Smoking status: Former Smoker -- 0.50 packs/day    Types: Cigarettes  .  Smokeless tobacco: Not on file  . Alcohol Use: No  . Drug Use: No  . Sexual Activity: Not on file   Other Topics Concern  . None   Social History Narrative     Medications: Patient's Medications  New Prescriptions   No medications on file  Previous Medications   ACETAMINOPHEN (TYLENOL) 325 MG TABLET    Take 2 tablets (650 mg total) by mouth every 6 (six) hours as needed for mild pain (or Fever >/= 101).   ASPIRIN EC 325 MG TABLET    Take 1 tablet (325 mg total) by mouth daily. For 1 month   BISACODYL (DULCOLAX) 10 MG SUPPOSITORY    Place 1 suppository (10 mg total) rectally daily as needed for moderate constipation.   DOCUSATE SODIUM (COLACE) 100 MG CAPSULE    Take 1 capsule (100 mg total) by mouth 2 (two) times daily. Continue this while taking narcotics to help with bowel movements   FERROUS SULFATE 325 (65 FE) MG TABLET    Take 325 mg by mouth daily with breakfast.   HYDROCODONE-ACETAMINOPHEN (NORCO) 5-325 MG PER TABLET    Take one tablet by mouth every 12 hours for pain. Not to exceed 3076m of APAP from all sources/24h   LORAZEPAM (ATIVAN) 0.5 MG TABLET    Take 1 tablet (0.5 mg total) by mouth every 12 (twelve) hours. Hold for sedation   METOPROLOL (  LOPRESSOR) 50 MG TABLET    Take 50 mg by mouth daily.   POLYETHYLENE GLYCOL (MIRALAX / GLYCOLAX) PACKET    Take 17 g by mouth daily.   QUETIAPINE (SEROQUEL) 25 MG TABLET    Take 25 mg by mouth 2 (two) times daily.   SENNA-DOCUSATE (SENOKOT-S) 8.6-50 MG PER TABLET    Take 2 tablets by mouth at bedtime.   SODIUM PHOSPHATE (FLEET) 7-19 GM/118ML ENEM    Place 133 mLs (1 enema total) rectally daily as needed for severe constipation.   VITAMIN B-12 (CYANOCOBALAMIN) 1000 MCG TABLET    Take 1,000 mcg by mouth daily.  Modified Medications   No medications on file  Discontinued Medications   No medications on file     Review of Systems  Unable to perform ROS: Dementia    Filed Vitals:   03/08/15 1556  BP: 106/75  Pulse: 74  Temp:  97.7 F (36.5 C)   There is no weight on file to calculate BMI.  Physical Exam  Constitutional: She appears well-developed.  Frail appearing in NAD. Sitting in w/c  HENT:  Mouth/Throat: Oropharynx is clear and moist. No oropharyngeal exudate.  Eyes: Pupils are equal, round, and reactive to light. No scleral icterus.  Neck: Neck supple. No tracheal deviation present. No thyromegaly present.  Cardiovascular: Normal rate, regular rhythm, normal heart sounds and intact distal pulses.  Exam reveals no gallop and no friction rub.   No murmur heard. No LE edema b/l. no calf TTP. No carotid bruit b/l  Pulmonary/Chest: Effort normal and breath sounds normal. No stridor. No respiratory distress. She has no wheezes. She has no rales.  Abdominal: Soft. Bowel sounds are normal. She exhibits no distension and no mass. There is no tenderness. There is no rebound and no guarding.  Musculoskeletal:  Thoracic kyphosis present  Lymphadenopathy:    She has no cervical adenopathy.  Neurological: She is alert. She has normal reflexes.  Skin: Skin is warm and dry. No rash noted.  Psychiatric: She has a normal mood and affect. Her behavior is normal. Her mood appears not anxious. She is not agitated, not aggressive and not combative.     Labs reviewed: No visits with results within 3 Month(s) from this visit. Latest known visit with results is:  Admission on 07/18/2014, Discharged on 07/24/2014  Component Date Value Ref Range Status  . WBC 07/19/2014 12.9* 4.0 - 10.5 K/uL Final  . RBC 07/19/2014 4.32  3.87 - 5.11 MIL/uL Final  . Hemoglobin 07/19/2014 12.5  12.0 - 15.0 g/dL Final  . HCT 07/19/2014 37.5  36.0 - 46.0 % Final  . MCV 07/19/2014 86.8  78.0 - 100.0 fL Final  . MCH 07/19/2014 28.9  26.0 - 34.0 pg Final  . MCHC 07/19/2014 33.3  30.0 - 36.0 g/dL Final  . RDW 07/19/2014 13.9  11.5 - 15.5 % Final  . Platelets 07/19/2014 232  150 - 400 K/uL Final  . Neutrophils Relative % 07/19/2014 86* 43 - 77  % Final  . Neutro Abs 07/19/2014 11.0* 1.7 - 7.7 K/uL Final  . Lymphocytes Relative 07/19/2014 8* 12 - 46 % Final  . Lymphs Abs 07/19/2014 1.1  0.7 - 4.0 K/uL Final  . Monocytes Relative 07/19/2014 6  3 - 12 % Final  . Monocytes Absolute 07/19/2014 0.7  0.1 - 1.0 K/uL Final  . Eosinophils Relative 07/19/2014 0  0 - 5 % Final  . Eosinophils Absolute 07/19/2014 0.0  0.0 - 0.7 K/uL Final  .  Basophils Relative 07/19/2014 0  0 - 1 % Final  . Basophils Absolute 07/19/2014 0.0  0.0 - 0.1 K/uL Final  . Sodium 07/19/2014 141  137 - 147 mEq/L Final  . Potassium 07/19/2014 4.2  3.7 - 5.3 mEq/L Final  . Chloride 07/19/2014 105  96 - 112 mEq/L Final  . CO2 07/19/2014 24  19 - 32 mEq/L Final  . Glucose, Bld 07/19/2014 148* 70 - 99 mg/dL Final  . BUN 07/19/2014 15  6 - 23 mg/dL Final  . Creatinine, Ser 07/19/2014 0.73  0.50 - 1.10 mg/dL Final  . Calcium 07/19/2014 10.8* 8.4 - 10.5 mg/dL Final  . Total Protein 07/19/2014 7.4  6.0 - 8.3 g/dL Final  . Albumin 07/19/2014 3.7  3.5 - 5.2 g/dL Final  . AST 07/19/2014 21  0 - 37 U/L Final  . ALT 07/19/2014 12  0 - 35 U/L Final  . Alkaline Phosphatase 07/19/2014 149* 39 - 117 U/L Final  . Total Bilirubin 07/19/2014 0.3  0.3 - 1.2 mg/dL Final  . GFR calc non Af Amer 07/19/2014 82* >90 mL/min Final  . GFR calc Af Amer 07/19/2014 >90  >90 mL/min Final   Comment: (NOTE)                          The eGFR has been calculated using the CKD EPI equation.                          This calculation has not been validated in all clinical situations.                          eGFR's persistently <90 mL/min signify possible Chronic Kidney                          Disease.  . Anion gap 07/19/2014 12  5 - 15 Final  . Lactic Acid, Venous 07/19/2014 2.7* 0.5 - 2.2 mmol/L Final  . Lipase 07/19/2014 38  11 - 59 U/L Final  . Color, Urine 07/19/2014 YELLOW  YELLOW Final  . APPearance 07/19/2014 CLOUDY* CLEAR Final  . Specific Gravity, Urine 07/19/2014 1.015  1.005 - 1.030  Final  . pH 07/19/2014 6.5  5.0 - 8.0 Final  . Glucose, UA 07/19/2014 100* NEGATIVE mg/dL Final  . Hgb urine dipstick 07/19/2014 MODERATE* NEGATIVE Final  . Bilirubin Urine 07/19/2014 NEGATIVE  NEGATIVE Final  . Ketones, ur 07/19/2014 15* NEGATIVE mg/dL Final  . Protein, ur 07/19/2014 30* NEGATIVE mg/dL Final  . Urobilinogen, UA 07/19/2014 1.0  0.0 - 1.0 mg/dL Final  . Nitrite 07/19/2014 NEGATIVE  NEGATIVE Final  . Leukocytes, UA 07/19/2014 LARGE* NEGATIVE Final  . Total CK 07/19/2014 111  7 - 177 U/L Final  . Squamous Epithelial / LPF 07/19/2014 RARE  RARE Final  . WBC, UA 07/19/2014 TOO NUMEROUS TO COUNT  <3 WBC/hpf Final  . RBC / HPF 07/19/2014 11-20  <3 RBC/hpf Final  . Bacteria, UA 07/19/2014 MANY* RARE Final  . MRSA by PCR 07/19/2014 NEGATIVE  NEGATIVE Final   Comment:                                 The GeneXpert MRSA Assay (FDA  approved for NASAL specimens                          only), is one component of a                          comprehensive MRSA colonization                          surveillance program. It is not                          intended to diagnose MRSA                          infection nor to guide or                          monitor treatment for                          MRSA infections.  . ABO/RH(D) 07/19/2014 B NEG   Final  . Antibody Screen 07/19/2014 NEG   Final  . Sample Expiration 07/19/2014 07/22/2014   Final  . Unit Number 07/19/2014 [id number]   Final  . Blood Component Type 07/19/2014 RED CELLS,LR   Final  . Unit division 07/19/2014 00   Final  . Status of Unit 07/19/2014 ISSUED,FINAL   Final  . Transfusion Status 07/19/2014 OK TO TRANSFUSE   Final  . Crossmatch Result 07/19/2014 Compatible   Final  . Prothrombin Time 07/19/2014 15.3* 11.6 - 15.2 seconds Final  . INR 07/19/2014 1.21  0.00 - 1.49 Final  . aPTT 07/19/2014 29  24 - 37 seconds Final  . Vit D, 25-Hydroxy 07/19/2014 12* 30 - 89 ng/mL Final   Comment:  (NOTE)                          This assay accurately quantifies Vitamin D, which is the sum of the                          25-Hydroxy forms of Vitamin D2 and D3.  Studies have shown that the                          optimum concentration of 25-Hydroxy Vitamin D is 30 ng/mL or higher.                           Concentrations of Vitamin D between 20 and 29 ng/mL are considered to                          be insufficient and concentrations less than 20 ng/mL are considered                          to be deficient for Vitamin D.                          Performed at [facility]  . ABO/RH(D) 07/19/2014 B NEG   Final  . Specimen Description  07/19/2014 URINE, CATHETERIZED   Final  . Special Requests 07/19/2014 NONE   Final  . Culture  Setup Time 07/19/2014    Final                   Value:07/19/2014 16:07                         Performed at [facility]  . Colony Count 07/19/2014    Final                   Value:NO GROWTH                         Performed at [facility]  . Culture 07/19/2014    Final                   Value:NO GROWTH                         Performed at [facility]  . Report Status 07/19/2014 07/20/2014 FINAL   Final  . MRSA, PCR 07/20/2014 NEGATIVE  NEGATIVE Final  . Staphylococcus aureus 07/20/2014 NEGATIVE  NEGATIVE Final   Comment:                                 The Xpert SA Assay (FDA                          approved for NASAL specimens                          in patients over 65 years of age),                          is one component of                          a comprehensive surveillance                          program.  Test performance has                          been validated by [employer] for patients greater                          than or equal to 41 year old.                          It is not intended                          to diagnose infection nor to                          guide  or monitor treatment.  . WBC 07/21/2014 6.5  4.0 - 10.5 K/uL Final  . RBC 07/21/2014  3.05* 3.87 - 5.11 MIL/uL Final  . Hemoglobin 07/21/2014 9.0* 12.0 - 15.0 g/dL Final  . HCT 07/21/2014 26.7* 36.0 - 46.0 % Final  . MCV 07/21/2014 87.5  78.0 - 100.0 fL Final  . MCH 07/21/2014 29.5  26.0 - 34.0 pg Final  . MCHC 07/21/2014 33.7  30.0 - 36.0 g/dL Final  . RDW 07/21/2014 14.5  11.5 - 15.5 % Final  . Platelets 07/21/2014 180  150 - 400 K/uL Final  . Sodium 07/21/2014 140  137 - 147 mEq/L Final  . Potassium 07/21/2014 3.6* 3.7 - 5.3 mEq/L Final  . Chloride 07/21/2014 105  96 - 112 mEq/L Final  . CO2 07/21/2014 27  19 - 32 mEq/L Final  . Glucose, Bld 07/21/2014 115* 70 - 99 mg/dL Final  . BUN 07/21/2014 8  6 - 23 mg/dL Final  . Creatinine, Ser 07/21/2014 0.61  0.50 - 1.10 mg/dL Final  . Calcium 07/21/2014 9.8  8.4 - 10.5 mg/dL Final  . GFR calc non Af Amer 07/21/2014 87* >90 mL/min Final  . GFR calc Af Amer 07/21/2014 >90  >90 mL/min Final   Comment: (NOTE)                          The eGFR has been calculated using the CKD EPI equation.                          This calculation has not been validated in all clinical situations.                          eGFR's persistently <90 mL/min signify possible Chronic Kidney                          Disease.  . Anion gap 07/21/2014 8  5 - 15 Final  . Sodium 07/22/2014 137  137 - 147 mEq/L Final  . Potassium 07/22/2014 4.0  3.7 - 5.3 mEq/L Final  . Chloride 07/22/2014 103  96 - 112 mEq/L Final  . CO2 07/22/2014 25  19 - 32 mEq/L Final  . Glucose, Bld 07/22/2014 118* 70 - 99 mg/dL Final  . BUN 07/22/2014 9  6 - 23 mg/dL Final  . Creatinine, Ser 07/22/2014 0.59  0.50 - 1.10 mg/dL Final  . Calcium 07/22/2014 9.8  8.4 - 10.5 mg/dL Final  . GFR calc non Af Amer 07/22/2014 87* >90 mL/min Final  . GFR calc Af Amer 07/22/2014 >90  >90 mL/min Final   Comment: (NOTE)                          The eGFR has been calculated using the CKD EPI equation.                           This calculation has not been validated in all clinical situations.                          eGFR's persistently <90 mL/min signify possible Chronic Kidney                          Disease.  . Anion gap 07/22/2014 9  5 - 15 Final  . WBC 07/22/2014 5.7  4.0 - 10.5 K/uL Final  . RBC 07/22/2014 2.76* 3.87 - 5.11 MIL/uL Final  . Hemoglobin 07/22/2014 7.8* 12.0 - 15.0 g/dL Final  . HCT 07/22/2014 23.7* 36.0 - 46.0 % Final  . MCV 07/22/2014 85.9  78.0 - 100.0 fL Final  . MCH 07/22/2014 28.3  26.0 - 34.0 pg Final  . MCHC 07/22/2014 32.9  30.0 - 36.0 g/dL Final  . RDW 07/22/2014 14.6  11.5 - 15.5 % Final  . Platelets 07/22/2014 184  150 - 400 K/uL Final  . Order Confirmation 07/22/2014 ORDER PROCESSED BY BLOOD BANK   Final  . Hemoglobin 07/23/2014 10.4* 12.0 - 15.0 g/dL Final   POST TRANSFUSION SPECIMEN  . HCT 07/23/2014 31.1* 36.0 - 46.0 % Final  . Sodium 07/24/2014 135* 137 - 147 mEq/L Final  . Potassium 07/24/2014 3.9  3.7 - 5.3 mEq/L Final  . Chloride 07/24/2014 99  96 - 112 mEq/L Final  . CO2 07/24/2014 26  19 - 32 mEq/L Final  . Glucose, Bld 07/24/2014 109* 70 - 99 mg/dL Final  . BUN 07/24/2014 15  6 - 23 mg/dL Final  . Creatinine, Ser 07/24/2014 0.67  0.50 - 1.10 mg/dL Final  . Calcium 07/24/2014 10.6* 8.4 - 10.5 mg/dL Final  . GFR calc non Af Amer 07/24/2014 84* >90 mL/min Final  . GFR calc Af Amer 07/24/2014 >90  >90 mL/min Final   Comment: (NOTE)                          The eGFR has been calculated using the CKD EPI equation.                          This calculation has not been validated in all clinical situations.                          eGFR's persistently <90 mL/min signify possible Chronic Kidney                          Disease.  . Anion gap 07/24/2014 10  5 - 15 Final  . WBC 07/24/2014 7.0  4.0 - 10.5 K/uL Final  . RBC 07/24/2014 3.48* 3.87 - 5.11 MIL/uL Final  . Hemoglobin 07/24/2014 10.1* 12.0 - 15.0 g/dL Final  . HCT 07/24/2014 30.5* 36.0 - 46.0 %  Final  . MCV 07/24/2014 87.6  78.0 - 100.0 fL Final  . MCH 07/24/2014 29.0  26.0 - 34.0 pg Final  . MCHC 07/24/2014 33.1  30.0 - 36.0 g/dL Final  . RDW 07/24/2014 15.0  11.5 - 15.5 % Final  . Platelets 07/24/2014 239  150 - 400 K/uL Final     Assessment/Plan    ICD-9-CM ICD-10-CM   1. Psychosis, unspecified psychosis type - uncontrolled 298.9 F29   2. Dementia without behavioral disturbance - stable 294.20 F03.90   3. Essential hypertension - controlled 401.9 I10   4. Hyperparathyroidism - stable 252.00 E21.3   5. Hyperlipidemia - stable 272.4 E78.5   6. Pernicious anemia - stable 281.0 D51.0    --change seroquel to qAM, q3PM and qHS dosing  --cont other meds as ordered  --cont nutritional supplement BID  --PT/ST as indicated  --will follow  Pamela Blackburn  [facility] [address] [place], [place] [address] (  [phone] Office (Wednesdays and Fridays 8 AM - 5 PM) [phone] Cell (Monday-Friday 8 AM - 5 PM)

## 2015-03-16 ENCOUNTER — Other Ambulatory Visit: Payer: Self-pay

## 2015-03-16 MED ORDER — HYDROCODONE-ACETAMINOPHEN 5-325 MG PO TABS: ORAL_TABLET | ORAL | Status: DC

## 2015-03-16 NOTE — Telephone Encounter (Signed)
RX faxed to [facility] @ [phone], phone number [phone] 

## 2015-03-21 ENCOUNTER — Encounter: Payer: Self-pay | Admitting: *Deleted

## 2015-04-05 ENCOUNTER — Non-Acute Institutional Stay (SKILLED_NURSING_FACILITY): Payer: Medicare HMO | Admitting: Adult Health

## 2015-04-05 DIAGNOSIS — D510 Vitamin B12 deficiency anemia due to intrinsic factor deficiency: Secondary | ICD-10-CM | POA: Diagnosis not present

## 2015-04-05 DIAGNOSIS — E213 Hyperparathyroidism, unspecified: Secondary | ICD-10-CM

## 2015-04-05 DIAGNOSIS — F0390 Unspecified dementia without behavioral disturbance: Secondary | ICD-10-CM

## 2015-04-05 DIAGNOSIS — S72142S Displaced intertrochanteric fracture of left femur, sequela: Secondary | ICD-10-CM

## 2015-04-05 DIAGNOSIS — I10 Essential (primary) hypertension: Secondary | ICD-10-CM | POA: Diagnosis not present

## 2015-04-05 DIAGNOSIS — K5900 Constipation, unspecified: Secondary | ICD-10-CM | POA: Diagnosis not present

## 2015-04-05 DIAGNOSIS — F29 Unspecified psychosis not due to a substance or known physiological condition: Secondary | ICD-10-CM | POA: Diagnosis not present

## 2015-04-25 ENCOUNTER — Non-Acute Institutional Stay (SKILLED_NURSING_FACILITY): Payer: Medicare HMO | Admitting: Adult Health

## 2015-04-25 DIAGNOSIS — J690 Pneumonitis due to inhalation of food and vomit: Secondary | ICD-10-CM

## 2015-04-25 DIAGNOSIS — D649 Anemia, unspecified: Secondary | ICD-10-CM

## 2015-05-01 ENCOUNTER — Encounter: Payer: Self-pay | Admitting: Adult Health

## 2015-05-01 NOTE — Progress Notes (Signed)
Patient ID: Pamela Blackburn, female   DOB: Mar 06, 1936, 79 y.o.   MRN: [id number]  [facility]     No Known Allergies     Chief Complaint  Patient presents with  . Medical Management of Chronic Issues    HPI:  She is a long term resident of this facility being seen for the management of her chronic illnesses. She is unable to fully participate in the hpi or ros; she states she is feeling "ok". There are no nursing concerns today.    Past Medical History  Diagnosis Date  . Anemia   . Osteoporosis   . Coronary artery disease   . Dementia   . Hypertension   . Leukocytosis 07/26/2012  . Hyperlipidemia 07/26/2012  . Pernicious anemia 07/26/2012  . HTN (hypertension) 07/26/2012  . CAD (coronary artery disease) 07/26/2012    Cath 12/26/05: LAD 10-15% mid stenosis and distally, LCIRC small and patent, RCA 15-20% mid stenosis. EF 45%, LV with mild anterolateral and mid inferior wall hypokinesia  Myoview : 12/13/2009: No evidence of inducible reversible ischemia EF 77%  . Anxiety 07/26/2012  . History of tobacco use 07/26/2012  . Hyperparathyroidism 07/26/2012  . Vitamin D deficiency 07/26/2012    Past Surgical History  Procedure Laterality Date  . Tonsillectomy    . Intramedullary (im) nail intertrochanteric Left 07/20/2014    Procedure: INTRAMEDULLARY (IM) NAIL INTERTROCHANTRIC;  Surgeon: Sheral Apley, MD;  Location: [facility];  Service: Orthopedics;  Laterality: Left;    VITAL SIGNS BP 126/70 mmHg  Pulse 73  Ht  (1.549 m)  Wt 104 lb (47.174 kg)  BMI 19.66 kg/m2   Outpatient Encounter Prescriptions as of 04/05/2015  Medication Sig  . acetaminophen (TYLENOL) 325 MG tablet Take 2 tablets (650 mg total) by mouth every 6 (six) hours as needed for mild pain (or Fever >/= 101).  . bisacodyl (DULCOLAX) 10 MG suppository Place 1 suppository (10 mg total) rectally daily as needed for moderate constipation.  . docusate sodium (COLACE) 100 MG capsule Take 1 capsule (100 mg total) by mouth 2 (two)  times daily. Continue this while taking narcotics to help with bowel movements  . ferrous sulfate 325 (65 FE) MG tablet Take 325 mg by mouth daily with breakfast. For anemia  . HYDROcodone-acetaminophen (NORCO) 5-325 MG per tablet Take one tablet by mouth every 12 hours for pain. Not to exceed  of APAP from all sources/24h  . LORazepam (ATIVAN) 0.5 MG tablet Take 1 tablet (0.5 mg total) by mouth every 12 (twelve) hours. Hold for sedation  . metoprolol (LOPRESSOR) 50 MG tablet Take 50 mg by mouth daily.  . polyethylene glycol (MIRALAX / GLYCOLAX) packet Take 17 g by mouth daily.  . QUEtiapine (SEROQUEL) 25 MG tablet Take 12.5-25 mg by mouth 3 (three) times daily. 12.5 mg in the am and hs and 25 mg in the afternoon  . senna-docusate (SENOKOT-S) 8.6-50 MG per tablet Take 2 tablets by mouth at bedtime.  . sodium phosphate (FLEET) 7-19 GM/118ML ENEM Place 133 mLs (1 enema total) rectally daily as needed for severe constipation.  . vitamin B-12 (CYANOCOBALAMIN) 1000 MCG tablet Take 1,000 mcg by mouth daily. For anemia      SIGNIFICANT DIAGNOSTIC EXAMS  09-17-14: chest x-ray; rotated kyphotic projection hyperinflation with clear lungs   LABS REVIEWED:   07-24-14: wbc 7.0; hgb 10.1; hct 30.5; mcv 87.6; plt 239; glucose 109; bun 15; creat 0.67; k+3.9; na++135; ca++10.6  10-26-14: wbc 7.0; hgb 13.2; hct 42.5;  mcv 882; plt 300; glucose 94; bun 13; creat 0.63; k+3.9; na++143; ca++11.1; PTH 160.3    ROS Unable to perform ROS     Physical Exam Constitutional: No distress.  frail  Neck: Neck supple. No JVD present. No thyromegaly present.  Cardiovascular: Normal rate, regular rhythm and intact distal pulses.   Respiratory: Effort normal and breath sounds normal. No respiratory distress. She has no wheezes.  GI: Soft. Bowel sounds are normal. She exhibits no distension.  Musculoskeletal: She exhibits no edema.  Is able to move all extremities   Neurological: She is alert.  Skin: Skin is  warm and dry. She is not diaphoretic.      ASSESSMENT/ PLAN:  1. Hypertension: will continue lopressor 50 mg daily; will monitor  2. Constipation; will continue colace twice daily; miralax daily; and senna s 2 tabs nightly   3. Anemia: will continue iron daily her hgb is 13.2;   4. Status post left hip fracture from August 2015: no change in recent status; is on long term pan medication; vicodin 5/325 mg twice daily and will monitor   5. Psychosis: does talk to herself; is unable answer questions; no recent reports of behavioral issues present; will continue seroquel 12.5 mg in the am and hs with 25 mg in the afternoon and will continue ativan 0.5 mg twice daily for anxiety and will monitor her status.   6. Dementia without behavioral disturbance: no recent change in status; is presently not on medications; will not make changes will monitor   7. Hypercalcemia: her calcium level is 11.1 . Her goals of care is comfort with her no hospitalization and dnr status.   8.  Hyperparathyroidism: Her PTH: is 160.3. She is not on medications; will not make changes will continue to monitor her status.     Will check cbc; cmp lipids; hgb a1c    Synthia Innocenteborah Green NP [facility]  Contact [phone] Monday through Friday 8am- 5pm  After hours call [phone]

## 2015-05-02 DIAGNOSIS — J690 Pneumonitis due to inhalation of food and vomit: Secondary | ICD-10-CM | POA: Insufficient documentation

## 2015-05-02 NOTE — Progress Notes (Signed)
Patient ID: Pamela Blackburn, female   DOB: 1936/01/12, 79 y.o.   MRN: [id number]  [facility]     No Known Allergies     Chief Complaint  Patient presents with  . Acute Visit    aspiration     HPI:  Nursing staff reports that she choked at lunch time. Her 02 sats dropped to 85% on room air  and she required oxygen supplementation. She continues to have some difficulty breathing present. She did require suctioning with clear mucus returned. Staff is concerned that she has aspirated.    Past Medical History  Diagnosis Date  . Anemia   . Osteoporosis   . Coronary artery disease   . Dementia   . Hypertension   . Leukocytosis 07/26/2012  . Hyperlipidemia 07/26/2012  . Pernicious anemia 07/26/2012  . HTN (hypertension) 07/26/2012  . CAD (coronary artery disease) 07/26/2012    Cath 12/26/05: LAD 10-15% mid stenosis and distally, LCIRC small and patent, RCA 15-20% mid stenosis. EF 45%, LV with mild anterolateral and mid inferior wall hypokinesia  Myoview : 12/13/2009: No evidence of inducible reversible ischemia EF 77%  . Anxiety 07/26/2012  . History of tobacco use 07/26/2012  . Hyperparathyroidism 07/26/2012  . Vitamin D deficiency 07/26/2012    Past Surgical History  Procedure Laterality Date  . Tonsillectomy    . Intramedullary (im) nail intertrochanteric Left 07/20/2014    Procedure: INTRAMEDULLARY (IM) NAIL INTERTROCHANTRIC;  Surgeon: Sheral Apley, MD;  Location: [facility];  Service: Orthopedics;  Laterality: Left;    VITAL SIGNS BP 144/74 mmHg  Pulse 70  Ht  (1.549 m)  Wt 104 lb (47.174 kg)  BMI 19.66 kg/m2  SpO2 92%   Outpatient Encounter Prescriptions as of 04/25/2015  Medication Sig  . acetaminophen (TYLENOL) 325 MG tablet Take 2 tablets (650 mg total) by mouth every 6 (six) hours as needed for mild pain (or Fever >/= 101).  . bisacodyl (DULCOLAX) 10 MG suppository Place 1 suppository (10 mg total) rectally daily as needed for moderate constipation.  . docusate sodium  (COLACE) 100 MG capsule Take 1 capsule (100 mg total) by mouth 2 (two) times daily. Continue this while taking narcotics to help with bowel movements  . ferrous sulfate 325 (65 FE) MG tablet Take 325 mg by mouth daily with breakfast. For anemia  . HYDROcodone-acetaminophen (NORCO) 5-325 MG per tablet Take one tablet by mouth every 12 hours for pain. Not to exceed  of APAP from all sources/24h  . LORazepam (ATIVAN) 0.5 MG tablet Take 1 tablet (0.5 mg total) by mouth every 12 (twelve) hours. Hold for sedation  . metoprolol (LOPRESSOR) 50 MG tablet Take 50 mg by mouth daily.  . polyethylene glycol (MIRALAX / GLYCOLAX) packet Take 17 g by mouth daily.  . QUEtiapine (SEROQUEL) 25 MG tablet Take 12.5-25 mg by mouth 3 (three) times daily. 12.5 mg in the am and hs and 25 mg in the afternoon  . senna-docusate (SENOKOT-S) 8.6-50 MG per tablet Take 2 tablets by mouth at bedtime.  . sodium phosphate (FLEET) 7-19 GM/118ML ENEM Place 133 mLs (1 enema total) rectally daily as needed for severe constipation.  . vitamin B-12 (CYANOCOBALAMIN) 1000 MCG tablet Take 1,000 mcg by mouth daily. For anemia     SIGNIFICANT DIAGNOSTIC EXAMS  09-17-14: chest x-ray; rotated kyphotic projection hyperinflation with clear lungs   04-25-15: chest x-ray: no acute cardiopulmonary process  LABS REVIEWED:   07-24-14: wbc 7.0; hgb 10.1; hct 30.5; mcv 87.6; plt  239; glucose 109; bun 15; creat 0.67; k+3.9; na++135; ca++10.6  10-26-14: wbc 7.0; hgb 13.2; hct 42.5; mcv 882; plt 300; glucose 94; bun 13; creat 0.63; k+3.9; na++143; ca++11.1; PTH 160.3 04-10-15: wbc 5.0; hgb 12.0; hct 37; mcv 89; plt 237; glucose 81; bun 19.1; creat 0.76; k+4.0; na++147; liver normal albumin 3.5; hgb a1c 5.5; chol 179; ldl 86; trig 304; hdl 32         Review of Systems  Unable to perform ROS    Physical Exam Constitutional: mild distress.  frail  Neck: Neck supple. No JVD present. No thyromegaly present.  Cardiovascular: Normal rate,  regular rhythm and intact distal pulses.   Respiratory: increased effort; rhonchi and wheezes throughout  GI: Soft. Bowel sounds are normal. She exhibits no distension.  Musculoskeletal: She exhibits no edema.  Is able to move all extremities   Neurological: She is alert.  Skin: Skin is warm and dry. She is not diaphoretic.    ASSESSMENT/ PLAN:  1. Aspiration pneumonia: although her x-ray is negative; I will treat this as an active infection. I will have speech therapy evaluate her for aspiration. Will being levaquin 500 mg daily for 10 days with florastor twice daily for 2 weeks; will begin duoneb every 6 hours routinely for one week then every 6 hours as needed.   2. Anemia: her hgb is normal at 12.0; will stop her iron and will check cbc in 2 months.    Synthia Innocenteborah Green NP [facility]  Contact [phone] Monday through Friday 8am- 5pm  After hours call [phone]

## 2015-05-03 ENCOUNTER — Other Ambulatory Visit: Payer: Self-pay

## 2015-05-03 MED ORDER — LORAZEPAM 0.5 MG PO TABS: 0.5000 mg | ORAL_TABLET | Freq: Two times a day (BID) | ORAL | Status: DC

## 2015-05-03 NOTE — Telephone Encounter (Signed)
rx request from [facility]

## 2015-05-16 ENCOUNTER — Non-Acute Institutional Stay (SKILLED_NURSING_FACILITY): Payer: Medicare HMO | Admitting: Adult Health

## 2015-05-16 ENCOUNTER — Encounter: Payer: Self-pay | Admitting: Adult Health

## 2015-05-16 DIAGNOSIS — E213 Hyperparathyroidism, unspecified: Secondary | ICD-10-CM | POA: Diagnosis not present

## 2015-05-16 DIAGNOSIS — F29 Unspecified psychosis not due to a substance or known physiological condition: Secondary | ICD-10-CM | POA: Diagnosis not present

## 2015-05-16 DIAGNOSIS — E785 Hyperlipidemia, unspecified: Secondary | ICD-10-CM

## 2015-05-16 DIAGNOSIS — K5900 Constipation, unspecified: Secondary | ICD-10-CM

## 2015-05-16 DIAGNOSIS — S72142S Displaced intertrochanteric fracture of left femur, sequela: Secondary | ICD-10-CM

## 2015-05-16 DIAGNOSIS — D649 Anemia, unspecified: Secondary | ICD-10-CM

## 2015-05-16 DIAGNOSIS — F0390 Unspecified dementia without behavioral disturbance: Secondary | ICD-10-CM | POA: Diagnosis not present

## 2015-05-16 DIAGNOSIS — I10 Essential (primary) hypertension: Secondary | ICD-10-CM | POA: Diagnosis not present

## 2015-05-16 NOTE — Progress Notes (Signed)
Patient ID: Pamela Blackburn, female   DOB: May 13, 1936, 79 y.o.   MRN: [id number]  [facility]     No Known Allergies     Chief Complaint  Patient presents with  . Medical Management of Chronic Issues    HPI:  She is a long term resident of this facility being seen for the management of her chronic illnesses. She is unable to fully participate in the hpi or ros. Overall there is little change in her status. She was treated for an aspiration earlier this month without complication. There are no nursing concerns at this time.    Past Medical History  Diagnosis Date  . Anemia   . Osteoporosis   . Coronary artery disease   . Dementia   . Hypertension   . Leukocytosis 07/26/2012  . Hyperlipidemia 07/26/2012  . Pernicious anemia 07/26/2012  . HTN (hypertension) 07/26/2012  . CAD (coronary artery disease) 07/26/2012    Cath 12/26/05: LAD 10-15% mid stenosis and distally, LCIRC small and patent, RCA 15-20% mid stenosis. EF 45%, LV with mild anterolateral and mid inferior wall hypokinesia  Myoview : 12/13/2009: No evidence of inducible reversible ischemia EF 77%  . Anxiety 07/26/2012  . History of tobacco use 07/26/2012  . Hyperparathyroidism 07/26/2012  . Vitamin D deficiency 07/26/2012    Past Surgical History  Procedure Laterality Date  . Tonsillectomy    . Intramedullary (im) nail intertrochanteric Left 07/20/2014    Procedure: INTRAMEDULLARY (IM) NAIL INTERTROCHANTRIC;  Surgeon: Sheral Apley, MD;  Location: [facility] OR;  Service: Orthopedics;  Laterality: Left;    VITAL SIGNS BP 130/70 mmHg  Pulse 70  Ht 5\' 1"  (1.549 m)  Wt 106 lb (48.081 kg)  BMI 20.04 kg/m2  SpO2 98%   Outpatient Encounter Prescriptions as of 05/16/2015  Medication Sig  . acetaminophen (TYLENOL) 325 MG tablet Take 2 tablets (650 mg total) by mouth every 6 (six) hours as needed for mild pain (or Fever >/= 101).  . bisacodyl (DULCOLAX) 10 MG suppository Place 1 suppository (10 mg total) rectally daily as needed for moderate  constipation.  . docusate sodium (COLACE) 100 MG capsule Take 1 capsule (100 mg total) by mouth 2 (two) times daily. Continue this while taking narcotics to help with bowel movements  . fenofibrate 54 MG tablet Take 54 mg by mouth daily.  Marland Kitchen HYDROcodone-acetaminophen (NORCO) 5-325 MG per tablet Take one tablet by mouth every 12 hours for pain. Not to exceed 3000mg  of APAP from all sources/24h  . ipratropium-albuterol (DUONEB) 0.5-2.5 (3) MG/3ML SOLN Take 3 mLs by nebulization every 6 (six) hours as needed.  Marland Kitchen LORazepam (ATIVAN) 0.5 MG tablet Take 1 tablet (0.5 mg total) by mouth every 12 (twelve) hours. Hold for sedation  . metoprolol (LOPRESSOR) 50 MG tablet Take 50 mg by mouth daily.  . polyethylene glycol (MIRALAX / GLYCOLAX) packet Take 17 g by mouth daily.  . QUEtiapine (SEROQUEL) 25 MG tablet Take 12.5 mg by mouth 3 (three) times daily.   Marland Kitchen senna-docusate (SENOKOT-S) 8.6-50 MG per tablet Take 2 tablets by mouth at bedtime.  . sodium phosphate (FLEET) 7-19 GM/118ML ENEM Place 133 mLs (1 enema total) rectally daily as needed for severe constipation.  . vitamin B-12 (CYANOCOBALAMIN) 1000 MCG tablet Take 1,000 mcg by mouth daily. For anemia      SIGNIFICANT DIAGNOSTIC EXAMS  09-17-14: chest x-ray; rotated kyphotic projection hyperinflation with clear lungs   04-25-15: chest x-ray: no acute cardiopulmonary process  LABS REVIEWED:   07-24-14: wbc  7.0; hgb 10.1; hct 30.5; mcv 87.6; plt 239; glucose 109; bun 15; creat 0.67; k+3.9; na++135; ca++10.6  10-26-14: wbc 7.0; hgb 13.2; hct 42.5; mcv 882; plt 300; glucose 94; bun 13; creat 0.63; k+3.9; na++143; ca++11.1; PTH 160.3 04-10-15: wbc 5.0; hgb 12.0; hct 37; mcv 89; plt 237; glucose 81; bun 19.1; creat 0.76; k+4.0; na++147; ca++11.0  liver normal albumin 3.5; hgb a1c 5.5; chol 179; ldl 86; trig 304; hdl 32     ROS Unable to perform ROS     Physical Exam Constitutional no distress  frail  Neck: Neck supple. No JVD present. No thyromegaly  present.  Cardiovascular: Normal rate, regular rhythm and intact distal pulses.   Respiratory: lungs clear effort normal  GI: Soft. Bowel sounds are normal. She exhibits no distension.  Musculoskeletal: She exhibits no edema.  Is able to move all extremities   Neurological: She is alert.  Skin: Skin is warm and dry. She is not diaphoretic.    ASSESSMENT/ PLAN:  1. Hypertension: will continue lopressor 50 mg daily; will monitor  2. Constipation; will continue colace twice daily; miralax daily; and senna s 2 tabs nightly   3. Anemia: is off medications;  her hgb is 13.2;   4. Status post left hip fracture from August 2015: no change in recent status; is on long term pain medication; vicodin 5/325 mg twice daily and will monitor   5. Psychosis: does talk to herself; is unable answer questions; no recent reports of behavioral issues present; will continue seroquel 12.5 mg three times daily and will continue ativan 0.5 mg twice daily for anxiety and will monitor her status.   6. Dementia without behavioral disturbance: no recent change in status; is presently not on medications; will not make changes will monitor  Her weight is 106 pounds   7. Hypercalcemia: her calcium level is 11.0 . Her goals of care is comfort with her no hospitalization and dnr status.   8.  Hyperparathyroidism: Her PTH: is 160.3. She is not on medications; will not make changes will continue to monitor her status.   9. Hypertriglyceridemia: will continue fenofibrate 54 mg daily; her trig ar 304.     Synthia Innocent NP [facility]  Contact [phone] Monday through Friday 8am- 5pm  After hours call [phone]

## 2015-05-29 ENCOUNTER — Other Ambulatory Visit: Payer: Self-pay

## 2015-05-29 MED ORDER — HYDROCODONE-ACETAMINOPHEN 5-325 MG PO TABS: ORAL_TABLET | ORAL | Status: DC

## 2015-05-29 NOTE — Telephone Encounter (Signed)
RX faxed to [facility] @ [phone], phone number [phone] 

## 2015-06-19 ENCOUNTER — Non-Acute Institutional Stay (SKILLED_NURSING_FACILITY): Payer: Medicare HMO | Admitting: Adult Health

## 2015-06-19 DIAGNOSIS — K5900 Constipation, unspecified: Secondary | ICD-10-CM

## 2015-06-19 DIAGNOSIS — S72142S Displaced intertrochanteric fracture of left femur, sequela: Secondary | ICD-10-CM

## 2015-06-19 DIAGNOSIS — E213 Hyperparathyroidism, unspecified: Secondary | ICD-10-CM | POA: Diagnosis not present

## 2015-06-19 DIAGNOSIS — I10 Essential (primary) hypertension: Secondary | ICD-10-CM | POA: Diagnosis not present

## 2015-06-19 DIAGNOSIS — E785 Hyperlipidemia, unspecified: Secondary | ICD-10-CM

## 2015-06-19 DIAGNOSIS — D649 Anemia, unspecified: Secondary | ICD-10-CM | POA: Diagnosis not present

## 2015-06-25 ENCOUNTER — Other Ambulatory Visit: Payer: Self-pay | Admitting: *Deleted

## 2015-06-25 MED ORDER — HYDROCODONE-ACETAMINOPHEN 5-325 MG PO TABS: ORAL_TABLET | ORAL | Status: DC

## 2015-06-25 NOTE — Telephone Encounter (Signed)
[facility] 

## 2015-07-16 ENCOUNTER — Encounter: Payer: Self-pay | Admitting: Internal Medicine

## 2015-07-16 ENCOUNTER — Non-Acute Institutional Stay (SKILLED_NURSING_FACILITY): Payer: Medicare HMO | Admitting: Internal Medicine

## 2015-07-16 DIAGNOSIS — F29 Unspecified psychosis not due to a substance or known physiological condition: Secondary | ICD-10-CM

## 2015-07-16 DIAGNOSIS — E785 Hyperlipidemia, unspecified: Secondary | ICD-10-CM

## 2015-07-16 DIAGNOSIS — I10 Essential (primary) hypertension: Secondary | ICD-10-CM

## 2015-07-16 DIAGNOSIS — F0391 Unspecified dementia with behavioral disturbance: Secondary | ICD-10-CM | POA: Diagnosis not present

## 2015-07-16 DIAGNOSIS — D510 Vitamin B12 deficiency anemia due to intrinsic factor deficiency: Secondary | ICD-10-CM | POA: Diagnosis not present

## 2015-07-16 NOTE — Progress Notes (Signed)
Patient ID: Pamela Blackburn, female   DOB: 1936-04-07, 79 y.o.   MRN: [id number]    DATE:07/16/15  Location:  [facility]    Place of Service: SNF (31)   Extended Emergency Contact Information Primary Emergency Contact: Vandevoorde,Lisa Address: [address]          [place], [place] [address] [place] Home Phone: [phone] Relation: Daughter Secondary Emergency Contact: Mignon [place] Home Phone: [phone] Relation: Relative  Advanced Directive information   DNR   Chief Complaint  Patient presents with  . Medical Management of Chronic Issues    HPI:  79 yo female long term resident seen today for f/u. She has no c/o. She is a poor historian due to dementia. Hx obtained from chart. No nursing issues. No falls.  Dementia/psychosis - weight stable on remeron and 2 cal nutritional supplement. She takes seroquel for psychosis and lorazepam  Anemia - hgb 13 and stable on B12 supplement  HTN - BP controlled on metoprolol  hyperlipdemia - stable on fenofibrate  Chronic pain - stable on norco  Constipation - stable on colace, senna and miralax. Uses dulcolax and fleets enema prn  Past Medical History  Diagnosis Date  . Anemia   . Osteoporosis   . Coronary artery disease   . Dementia   . Hypertension   . Leukocytosis 07/26/2012  . Hyperlipidemia 07/26/2012  . Pernicious anemia 07/26/2012  . HTN (hypertension) 07/26/2012  . CAD (coronary artery disease) 07/26/2012    Cath 12/26/05: LAD 10-15% mid stenosis and distally, LCIRC small and patent, RCA 15-20% mid stenosis. EF 45%, LV with mild anterolateral and mid inferior wall hypokinesia  Myoview : 12/13/2009: No evidence of inducible reversible ischemia EF 77%  . Anxiety 07/26/2012  . History of tobacco use 07/26/2012  . Hyperparathyroidism 07/26/2012  . Vitamin D deficiency 07/26/2012    Past Surgical History  Procedure Laterality Date  . Tonsillectomy    . Intramedullary (im)  nail intertrochanteric Left 07/20/2014    Procedure: INTRAMEDULLARY (IM) NAIL INTERTROCHANTRIC;  Surgeon: Sheral Apley, MD;  Location: [facility] OR;  Service: Orthopedics;  Laterality: Left;    Patient Care Team: Kaleen Mask, MD as PCP - General (Family Medicine) Rinaldo Cloud, MD as PCP - Cardiology (Cardiology)  Social History   Social History  . Marital Status: Legally Separated    Spouse Name: N/A  . Number of Children: N/A  . Years of Education: N/A   Occupational History  . Not on file.   Social History Main Topics  . Smoking status: Former Smoker -- 0.50 packs/day    Types: Cigarettes  . Smokeless tobacco: Not on file  . Alcohol Use: No  . Drug Use: No  . Sexual Activity: Not on file   Other Topics Concern  . Not on file   Social History Narrative     reports that she has quit smoking. Her smoking use included Cigarettes. She smoked 0.50 packs per day. She does not have any smokeless tobacco history on file. She reports that she does not drink alcohol or use illicit drugs.  Immunization History  Administered Date(s) Administered  . Pneumococcal Polysaccharide-23 08/02/2012    No Known Allergies  Medications: Patient's Medications  New Prescriptions   No medications on file  Previous Medications   ACETAMINOPHEN (TYLENOL) 325 MG TABLET    Take 2 tablets (650 mg total) by mouth every 6 (six) hours as needed for mild pain (or Fever >/=  101).   BISACODYL (DULCOLAX) 10 MG SUPPOSITORY    Place 1 suppository (10 mg total) rectally daily as needed for moderate constipation.   DOCUSATE SODIUM (COLACE) 100 MG CAPSULE    Take 1 capsule (100 mg total) by mouth 2 (two) times daily. Continue this while taking narcotics to help with bowel movements   FENOFIBRATE 54 MG TABLET    Take 54 mg by mouth daily.   HYDROCODONE-ACETAMINOPHEN (NORCO) 5-325 MG PER TABLET    Take one tablet by mouth every 12 hours for pain. Not to exceed  of APAP from all sources/24h    IPRATROPIUM-ALBUTEROL (DUONEB) 0.5-2.5 (3) MG/3ML SOLN    Take 3 mLs by nebulization every 6 (six) hours as needed.   LORAZEPAM (ATIVAN) 0.5 MG TABLET    Take 1 tablet (0.5 mg total) by mouth every 12 (twelve) hours. Hold for sedation   METOPROLOL (LOPRESSOR) 50 MG TABLET    Take 50 mg by mouth daily.   POLYETHYLENE GLYCOL (MIRALAX / GLYCOLAX) PACKET    Take 17 g by mouth daily.   QUETIAPINE (SEROQUEL) 25 MG TABLET    Take 12.5 mg by mouth 3 (three) times daily.    SENNA-DOCUSATE (SENOKOT-S) 8.6-50 MG PER TABLET    Take 2 tablets by mouth at bedtime.   SODIUM PHOSPHATE (FLEET) 7-19 GM/118ML ENEM    Place 133 mLs (1 enema total) rectally daily as needed for severe constipation.   VITAMIN B-12 (CYANOCOBALAMIN) 1000 MCG TABLET    Take 1,000 mcg by mouth daily. For anemia  Modified Medications   No medications on file  Discontinued Medications   No medications on file    Review of Systems  Unable to perform ROS: Dementia    Filed Vitals:   07/16/15 2101  BP: 102/59  Pulse: 68  Temp: 98 F (36.7 C)  Weight: 94 lb (42.638 kg)  SpO2: 98%   Body mass index is 17.77 kg/(m^2).  Physical Exam  Constitutional: She appears well-developed.  Frail appearing in NAD, sitting in w/c. She is asleep but easily awakened  HENT:  Mouth/Throat: Oropharynx is clear and moist. No oropharyngeal exudate.  Eyes: Pupils are equal, round, and reactive to light. No scleral icterus.  Neck: Neck supple. Carotid bruit is not present. No tracheal deviation present.  Cardiovascular: Normal rate, regular rhythm, normal heart sounds and intact distal pulses.  Exam reveals no gallop and no friction rub.   No murmur heard. No LE edema b/l. no calf TTP.   Pulmonary/Chest: Effort normal and breath sounds normal. No stridor. No respiratory distress. She has no wheezes. She has no rales.  Abdominal: Soft. Bowel sounds are normal. She exhibits no distension and no mass. There is no hepatomegaly. There is no tenderness.  There is no rebound and no guarding.  Lymphadenopathy:    She has no cervical adenopathy.  Neurological: She is alert.  Skin: Skin is warm and dry. No rash noted.  Psychiatric: She has a normal mood and affect. Her behavior is normal.     Labs reviewed: 06/25/15:  WBC 4400; Hgb 13; Hct 41.4; PLT 272K  No results found.   Assessment/Plan   ICD-9-CM ICD-10-CM   1. Dementia, with behavioral disturbance 294.21 F03.91   2. Psychosis, unspecified psychosis type 298.9 F29   3. Pernicious anemia 281.0 D51.0   4. Essential hypertension 401.9 I10   5. Hyperlipidemia 272.4 E78.5    --cont nutritional supplements per facility protocol  --Pt is medically stable on current tx plan. Continue  current medications as ordered. PT/OT/ST as indicated. Will follow  Ireta Pullman S. Ancil Linsey  [facility] [address] [place], [place] [address] [phone] Cell (Monday-Friday 8 AM - 5 PM) [phone] After 5 PM and follow prompts

## 2015-07-23 ENCOUNTER — Other Ambulatory Visit: Payer: Self-pay | Admitting: *Deleted

## 2015-07-23 MED ORDER — HYDROCODONE-ACETAMINOPHEN 5-325 MG PO TABS: ORAL_TABLET | ORAL | Status: DC

## 2015-07-23 NOTE — Telephone Encounter (Signed)
[facility] 

## 2015-08-17 ENCOUNTER — Encounter: Payer: Self-pay | Admitting: Adult Health

## 2015-08-17 NOTE — Progress Notes (Signed)
Patient ID: Pamela Blackburn, female   DOB: 10/24/1936, 79 y.o.   MRN: [id number]    Facility: [facility]      No Known Allergies  Chief Complaint  Patient presents with  . Medical Management of Chronic Issues    HPI:  She is a long term resident of this facility being seen for the management of her chronic illnesses. She is losing weight her weight in June was 106 pounds to her current weight of 95 pounds. She is unable to fully participate in the hpi or ros. There are no nursing concerns being voiced at this time.    Past Medical History  Diagnosis Date  . Anemia   . Osteoporosis   . Coronary artery disease   . Dementia   . Hypertension   . Leukocytosis 07/26/2012  . Hyperlipidemia 07/26/2012  . Pernicious anemia 07/26/2012  . HTN (hypertension) 07/26/2012  . CAD (coronary artery disease) 07/26/2012    Cath 12/26/05: LAD 10-15% mid stenosis and distally, LCIRC small and patent, RCA 15-20% mid stenosis. EF 45%, LV with mild anterolateral and mid inferior wall hypokinesia  Myoview : 12/13/2009: No evidence of inducible reversible ischemia EF 77%  . Anxiety 07/26/2012  . History of tobacco use 07/26/2012  . Hyperparathyroidism 07/26/2012  . Vitamin D deficiency 07/26/2012    Past Surgical History  Procedure Laterality Date  . Tonsillectomy    . Intramedullary (im) nail intertrochanteric Left 07/20/2014    Procedure: INTRAMEDULLARY (IM) NAIL INTERTROCHANTRIC;  Surgeon: Sheral Apley, MD;  Location: [facility] OR;  Service: Orthopedics;  Laterality: Left;    VITAL SIGNS BP 113/61 mmHg  Pulse 78  Ht  (1.549 m)  Wt 95 lb (43.092 kg)  BMI 17.96 kg/m2  SpO2 96%  Patient's Medications  New Prescriptions   No medications on file  Previous Medications   ACETAMINOPHEN (TYLENOL) 325 MG TABLET    Take 2 tablets (650 mg total) by mouth every 6 (six) hours as needed for mild pain (or Fever >/= 101).   BISACODYL (DULCOLAX) 10 MG SUPPOSITORY    Place 1 suppository (10 mg total)  rectally daily as needed for moderate constipation.   DOCUSATE SODIUM (COLACE) 100 MG CAPSULE    Take 1 capsule (100 mg total) by mouth 2 (two) times daily. Continue this while taking narcotics to help with bowel movements   FENOFIBRATE 54 MG TABLET    Take 54 mg by mouth daily.   HYDROCODONE-ACETAMINOPHEN (NORCO) 5-325 MG PER TABLET    Take one tablet by mouth every 12 hours for pain. Not to exceed  of APAP from all sources/24h   IPRATROPIUM-ALBUTEROL (DUONEB) 0.5-2.5 (3) MG/3ML SOLN    Take 3 mLs by nebulization every 6 (six) hours as needed.   LORAZEPAM (ATIVAN) 0.5 MG TABLET    Take 1 tablet (0.5 mg total) by mouth every 12 (twelve) hours. Hold for sedation   METOPROLOL (LOPRESSOR) 50 MG TABLET    Take 50 mg by mouth daily.   POLYETHYLENE GLYCOL (MIRALAX / GLYCOLAX) PACKET    Take 17 g by mouth daily.   QUETIAPINE (SEROQUEL) 25 MG TABLET    Take 12.5 mg by mouth 2 (two) times daily.    SENNA-DOCUSATE (SENOKOT-S) 8.6-50 MG PER TABLET    Take 2 tablets by mouth at bedtime.   SODIUM PHOSPHATE (FLEET) 7-19 GM/118ML ENEM    Place 133 mLs (1 enema total) rectally daily as needed for severe constipation.   VITAMIN B-12 (CYANOCOBALAMIN) 1000 MCG  TABLET    Take 1,000 mcg by mouth daily. For anemia  Modified Medications   No medications on file  Discontinued Medications   No medications on file     SIGNIFICANT DIAGNOSTIC EXAMS   09-17-14: chest x-ray; rotated kyphotic projection hyperinflation with clear lungs   04-25-15: chest x-ray: no acute cardiopulmonary process  LABS REVIEWED:   07-24-14: wbc 7.0; hgb 10.1; hct 30.5; mcv 87.6; plt 239; glucose 109; bun 15; creat 0.67; k+3.9; na++135; ca++10.6  10-26-14: wbc 7.0; hgb 13.2; hct 42.5; mcv 882; plt 300; glucose 94; bun 13; creat 0.63; k+3.9; na++143; ca++11.1; PTH 160.3 04-10-15: wbc 5.0; hgb 12.0; hct 37; mcv 89; plt 237; glucose 81; bun 19.1; creat 0.76; k+4.0; na++147; ca++11.0  liver normal albumin 3.5; hgb a1c 5.5; chol 179; ldl 86;  trig 304; hdl 32      Review of Systems  Unable to perform ROS: Dementia      Physical Exam  Constitutional: No distress.  Frail   Eyes: Conjunctivae are normal.  Neck: Neck supple. No JVD present. No thyromegaly present.  Cardiovascular: Normal rate, regular rhythm and intact distal pulses.   Respiratory: Effort normal and breath sounds normal. No respiratory distress. She has no wheezes.  GI: Soft. Bowel sounds are normal. She exhibits no distension. There is no tenderness.  Musculoskeletal: She exhibits no edema.  Able to move all extremities   Lymphadenopathy:    She has no cervical adenopathy.  Neurological: She is alert.  Skin: Skin is warm and dry. She is not diaphoretic.  Psychiatric: She has a normal mood and affect.       ASSESSMENT/ PLAN:  1. Hypertension: will continue lopressor 50 mg daily; will monitor  2. Constipation; will continue colace twice daily; miralax daily; and senna s 2 tabs nightly   3. Anemia: is off medications;  her hgb is 12.0  4. Status post left hip fracture from August 2015: no change in recent status; is on long term pain medication; vicodin 5/325 mg twice daily and will monitor   5. Psychosis: does talk to herself; is unable answer questions; no recent reports of behavioral issues present; will continue seroquel 12.5 mg twice  daily and will continue ativan 0.5 mg twice daily for anxiety and will monitor her status.   6. Dementia without behavioral disturbance: has declined in status; is presently not on medications; will not make changes will monitor  Her weight is 95 pounds   7. Hypercalcemia: her calcium level is 11.0 . Her goals of care is comfort with her no hospitalization and dnr status.   8.  Hyperparathyroidism: Her PTH: is 160.3. She is not on medications; will not make changes will continue to monitor her status.   9. Hypertriglyceridemia: will continue fenofibrate 54 mg daily; her trig are 304.  10. FTT: her current  weight is 95 pounds; will begin remeron 7.5 mg nightly for 30 days will monitor her status.         Synthia Innocent NP [facility]  Contact [phone] Monday through Friday 8am- 5pm  After hours call [phone]

## 2015-08-27 ENCOUNTER — Non-Acute Institutional Stay (SKILLED_NURSING_FACILITY): Payer: Medicare HMO | Admitting: Internal Medicine

## 2015-08-27 DIAGNOSIS — R627 Adult failure to thrive: Secondary | ICD-10-CM

## 2015-08-27 DIAGNOSIS — I10 Essential (primary) hypertension: Secondary | ICD-10-CM | POA: Diagnosis not present

## 2015-08-27 DIAGNOSIS — E213 Hyperparathyroidism, unspecified: Secondary | ICD-10-CM | POA: Diagnosis not present

## 2015-08-27 DIAGNOSIS — E785 Hyperlipidemia, unspecified: Secondary | ICD-10-CM | POA: Diagnosis not present

## 2015-08-27 DIAGNOSIS — D510 Vitamin B12 deficiency anemia due to intrinsic factor deficiency: Secondary | ICD-10-CM

## 2015-08-27 DIAGNOSIS — F29 Unspecified psychosis not due to a substance or known physiological condition: Secondary | ICD-10-CM | POA: Diagnosis not present

## 2015-08-27 DIAGNOSIS — F0391 Unspecified dementia with behavioral disturbance: Secondary | ICD-10-CM

## 2015-08-27 DIAGNOSIS — R69 Illness, unspecified: Secondary | ICD-10-CM | POA: Diagnosis not present

## 2015-08-30 ENCOUNTER — Encounter: Payer: Self-pay | Admitting: Internal Medicine

## 2015-09-11 DIAGNOSIS — M79672 Pain in left foot: Secondary | ICD-10-CM | POA: Diagnosis not present

## 2015-09-11 DIAGNOSIS — B351 Tinea unguium: Secondary | ICD-10-CM | POA: Diagnosis not present

## 2015-09-11 DIAGNOSIS — I2510 Atherosclerotic heart disease of native coronary artery without angina pectoris: Secondary | ICD-10-CM | POA: Diagnosis not present

## 2015-09-11 DIAGNOSIS — M79671 Pain in right foot: Secondary | ICD-10-CM | POA: Diagnosis not present

## 2015-10-02 ENCOUNTER — Encounter: Payer: Self-pay | Admitting: Adult Health

## 2015-10-02 ENCOUNTER — Non-Acute Institutional Stay (SKILLED_NURSING_FACILITY): Payer: Medicare HMO | Admitting: Adult Health

## 2015-10-02 DIAGNOSIS — I10 Essential (primary) hypertension: Secondary | ICD-10-CM | POA: Diagnosis not present

## 2015-10-02 DIAGNOSIS — F0390 Unspecified dementia without behavioral disturbance: Secondary | ICD-10-CM | POA: Diagnosis not present

## 2015-10-02 DIAGNOSIS — E785 Hyperlipidemia, unspecified: Secondary | ICD-10-CM | POA: Diagnosis not present

## 2015-10-02 DIAGNOSIS — S72142S Displaced intertrochanteric fracture of left femur, sequela: Secondary | ICD-10-CM

## 2015-10-02 DIAGNOSIS — R69 Illness, unspecified: Secondary | ICD-10-CM | POA: Diagnosis not present

## 2015-10-02 DIAGNOSIS — E213 Hyperparathyroidism, unspecified: Secondary | ICD-10-CM

## 2015-10-02 DIAGNOSIS — D510 Vitamin B12 deficiency anemia due to intrinsic factor deficiency: Secondary | ICD-10-CM | POA: Diagnosis not present

## 2015-10-02 DIAGNOSIS — K5900 Constipation, unspecified: Secondary | ICD-10-CM

## 2015-10-02 DIAGNOSIS — F29 Unspecified psychosis not due to a substance or known physiological condition: Secondary | ICD-10-CM | POA: Diagnosis not present

## 2015-10-02 NOTE — Progress Notes (Signed)
Patient ID: Pamela BashLinda B Blackburn, female   DOB: 1936/06/06, 79 y.o.   MRN: [id number]    [facility]      No Known Allergies  Chief Complaint  Patient presents with  . Annual Exam    HPI:  She is a long term resident of this facility being seen for her annual exam. Overall there is little change in her status. She has not been hospitalized over the past year. She is losing weight from 106 pounds in June to her most recent weight of 95 pounds. Her MOST form reads for no hospitalization; and no feeding tube.  She is unable to participate in the hpi or ros. There are no nursing concerns today.   Past Medical History  Diagnosis Date  . Anemia   . Osteoporosis   . Coronary artery disease   . Dementia   . Hypertension   . Leukocytosis 07/26/2012  . Hyperlipidemia 07/26/2012  . Pernicious anemia 07/26/2012  . HTN (hypertension) 07/26/2012  . CAD (coronary artery disease) 07/26/2012    Cath 12/26/05: LAD 10-15% mid stenosis and distally, LCIRC small and patent, RCA 15-20% mid stenosis. EF 45%, LV with mild anterolateral and mid inferior wall hypokinesia  Myoview : 12/13/2009: No evidence of inducible reversible ischemia EF 77%  . Anxiety 07/26/2012  . History of tobacco use 07/26/2012  . Hyperparathyroidism (HCC) 07/26/2012  . Vitamin D deficiency 07/26/2012    Past Surgical History  Procedure Laterality Date  . Tonsillectomy    . Intramedullary (im) nail intertrochanteric Left 07/20/2014    Procedure: INTRAMEDULLARY (IM) NAIL INTERTROCHANTRIC;  Surgeon: Sheral Apleyimothy D Murphy, MD;  Location: [facility] OR;  Service: Orthopedics;  Laterality: Left;    Family History  Problem Relation Age of Onset  . Breast cancer Mother   . Lung cancer Father     Social History   Social History  . Marital Status: Legally Separated    Spouse Name: N/A  . Number of Children: N/A  . Years of Education: N/A   Occupational History  . Not on file.   Social History Main Topics  . Smoking status: Former  Smoker -- 0.50 packs/day    Types: Cigarettes  . Smokeless tobacco: Not on file  . Alcohol Use: No  . Drug Use: No  . Sexual Activity: Not on file   Other Topics Concern  . Not on file   Social History Narrative     VITAL SIGNS BP 108/84 mmHg  Pulse 74  Ht 5\' 1"  (1.549 m)  Wt 95 lb (43.092 kg)  BMI 17.96 kg/m2  SpO2 97%  Patient's Medications  New Prescriptions   No medications on file  Previous Medications   ACETAMINOPHEN (TYLENOL) 325 MG TABLET    Take 2 tablets (650 mg total) by mouth every 6 (six) hours as needed for mild pain (or Fever >/= 101).   BISACODYL (DULCOLAX) 10 MG SUPPOSITORY    Place 1 suppository (10 mg total) rectally daily as needed for moderate constipation.   DOCUSATE SODIUM (COLACE) 100 MG CAPSULE    Take 1 capsule (100 mg total) by mouth 2 (two) times daily. Continue this while taking narcotics to help with bowel movements   FENOFIBRATE 54 MG TABLET    Take 54 mg by mouth daily.   HYDROCODONE-ACETAMINOPHEN (NORCO) 5-325 MG PER TABLET    Take one tablet by mouth every 12 hours for pain. Not to exceed 3000mg  of APAP from all sources/24h   IPRATROPIUM-ALBUTEROL (DUONEB) 0.5-2.5 (3) MG/3ML  SOLN    Take 3 mLs by nebulization every 6 (six) hours as needed.   LORAZEPAM (ATIVAN) 0.5 MG TABLET    Take 1 tablet (0.5 mg total) by mouth every 12 (twelve) hours. Hold for sedation   METOPROLOL (LOPRESSOR) 50 MG TABLET    Take 50 mg by mouth daily.   POLYETHYLENE GLYCOL (MIRALAX / GLYCOLAX) PACKET    Take 17 g by mouth daily.   QUETIAPINE (SEROQUEL) 25 MG TABLET    Take 12.5-25 mg by mouth 2 (two) times daily. Take 12.5 mg daily and 25 mg in the afternoon   SENNA-DOCUSATE (SENOKOT-S) 8.6-50 MG PER TABLET    Take 2 tablets by mouth at bedtime.   SODIUM PHOSPHATE (FLEET) 7-19 GM/118ML ENEM    Place 133 mLs (1 enema total) rectally daily as needed for severe constipation.   VITAMIN B-12 (CYANOCOBALAMIN) 1000 MCG TABLET    Take 1,000 mcg by mouth daily. For anemia  Modified  Medications   No medications on file  Discontinued Medications   No medications on file     SIGNIFICANT DIAGNOSTIC EXAMS   09-17-14: chest x-ray; rotated kyphotic projection hyperinflation with clear lungs   04-25-15: chest x-ray: no acute cardiopulmonary process  LABS REVIEWED:    10-26-14: wbc 7.0; hgb 13.2; hct 42.5; mcv 882; plt 300; glucose 94; bun 13; creat 0.63; k+3.9; na++143; ca++11.1; PTH 160.3 04-10-15: wbc 5.0; hgb 12.0; hct 37; mcv 89; plt 237; glucose 81; bun 19.1; creat 0.76; k+4.0; na++147; ca++11.0  liver normal albumin 3.5; hgb a1c 5.5; chol 179; ldl 86; trig 304; hdl 32   06-25-15: wbc 4.4; hgb 13.0; hct 41.4; mcv 91.2; plt 272      Review of Systems Unable to perform ROS: Dementia    Physical Exam Constitutional: No distress.  Frail   Eyes: Conjunctivae are normal.  Neck: Neck supple. No JVD present. No thyromegaly present.  Cardiovascular: Normal rate, regular rhythm and intact distal pulses.   Respiratory: Effort normal and breath sounds normal. No respiratory distress. She has no wheezes.  GI: Soft. Bowel sounds are normal. She exhibits no distension. There is no tenderness.  Musculoskeletal: She exhibits no edema.  Able to move all extremities   Lymphadenopathy:    She has no cervical adenopathy.  Neurological: She is alert.  Skin: Skin is warm and dry. She is not diaphoretic.  Psychiatric: She has a normal mood and affect.     ASSESSMENT/ PLAN:  1. Hypertension: will continue lopressor 50 mg daily; will monitor  2. Constipation; will continue colace twice daily; miralax daily; and senna s 2 tabs nightly   3. Anemia: is off medications;  her hgb is 13.0  4. Status post left hip fracture from August 2015: no change in recent status; is on long term pain medication; vicodin 5/325 mg twice daily and will monitor   5. Psychosis: does talk to herself; is unable answer questions; no recent reports of behavioral issues present; will continue seroquel  12.5 mg twice  daily and will continue ativan 0.5 mg twice daily for anxiety and will monitor her status.   6. Dementia without behavioral disturbance: has declined in status; is presently not on medications; will not make changes will monitor  Her weight is 95 pounds and is without change.   7. Hypercalcemia: her calcium level is 11.0 . Her goals of care is comfort with her no hospitalization and dnr status.   8.  Hyperparathyroidism: Her PTH: is 160.3. She is not on  medications; will not make changes will continue to monitor her status.   9. Hypertriglyceridemia: will continue fenofibrate 54 mg daily; her trig are 304.  10. FTT: her current weight is 95 pounds; her weight is without change from her previous weight. Will continue supplements per facility protocol; remeron did not affect weight.      Time spent with patient  45  minutes >50% time spent counseling; reviewing medical record; tests; labs; and developing future plan of care   Synthia Innocent NP [facility]  Contact [phone] Monday through Friday 8am- 5pm  After hours call [phone]

## 2015-10-10 DIAGNOSIS — H2513 Age-related nuclear cataract, bilateral: Secondary | ICD-10-CM | POA: Diagnosis not present

## 2015-10-10 LAB — HM DIABETES EYE EXAM

## 2015-10-31 ENCOUNTER — Encounter: Payer: Self-pay | Admitting: Adult Health

## 2015-10-31 ENCOUNTER — Non-Acute Institutional Stay (SKILLED_NURSING_FACILITY): Payer: Medicare HMO | Admitting: Adult Health

## 2015-10-31 DIAGNOSIS — F29 Unspecified psychosis not due to a substance or known physiological condition: Secondary | ICD-10-CM

## 2015-10-31 DIAGNOSIS — E781 Pure hyperglyceridemia: Secondary | ICD-10-CM | POA: Diagnosis not present

## 2015-10-31 DIAGNOSIS — K5901 Slow transit constipation: Secondary | ICD-10-CM

## 2015-10-31 DIAGNOSIS — R69 Illness, unspecified: Secondary | ICD-10-CM | POA: Diagnosis not present

## 2015-10-31 DIAGNOSIS — S72142S Displaced intertrochanteric fracture of left femur, sequela: Secondary | ICD-10-CM

## 2015-10-31 DIAGNOSIS — E213 Hyperparathyroidism, unspecified: Secondary | ICD-10-CM | POA: Diagnosis not present

## 2015-10-31 DIAGNOSIS — I10 Essential (primary) hypertension: Secondary | ICD-10-CM | POA: Diagnosis not present

## 2015-10-31 DIAGNOSIS — F0390 Unspecified dementia without behavioral disturbance: Secondary | ICD-10-CM

## 2015-10-31 NOTE — Progress Notes (Signed)
Patient ID: Pamela Blackburn, female   DOB: 11-Mar-1936, 79 y.o.   MRN: [id number]   Facility:  [facility]      No Known Allergies  Chief Complaint  Patient presents with  . Medical Management of Chronic Issues    HPI:  She is a long term resident of this facility being seen for the management of her chronic illnesses. Her weight in June was 106 pounds her current weight is 93 pounds. Her MOST form is for comfort care to avoid hospitalizations and no feeding tube.  She is unable to participate in the hpi or ros. There are no nursing conerns at this time.    Past Medical History  Diagnosis Date  . Anemia   . Osteoporosis   . Coronary artery disease   . Dementia   . Hypertension   . Leukocytosis 07/26/2012  . Hyperlipidemia 07/26/2012  . Pernicious anemia 07/26/2012  . HTN (hypertension) 07/26/2012  . CAD (coronary artery disease) 07/26/2012    Cath 12/26/05: LAD 10-15% mid stenosis and distally, LCIRC small and patent, RCA 15-20% mid stenosis. EF 45%, LV with mild anterolateral and mid inferior wall hypokinesia  Myoview : 12/13/2009: No evidence of inducible reversible ischemia EF 77%  . Anxiety 07/26/2012  . History of tobacco use 07/26/2012  . Hyperparathyroidism (HCC) 07/26/2012  . Vitamin D deficiency 07/26/2012  . Closed intertrochanteric fracture of right hip (HCC) 07/26/2012    Past Surgical History  Procedure Laterality Date  . Tonsillectomy    . Intramedullary (im) nail intertrochanteric Left 07/20/2014    Procedure: INTRAMEDULLARY (IM) NAIL INTERTROCHANTRIC;  Surgeon: Sheral Apley, MD;  Location: [facility] OR;  Service: Orthopedics;  Laterality: Left;    VITAL SIGNS BP 112/66 mmHg  Pulse 65  Ht  (1.549 m)  Wt 93 lb (42.185 kg)  BMI 17.58 kg/m2  Patient's Medications  New Prescriptions   No medications on file  Previous Medications   ACETAMINOPHEN (TYLENOL) 325 MG TABLET    Take 2 tablets (650 mg total) by mouth every 6 (six) hours as needed for mild pain (or Fever >/= 101).   BISACODYL (DULCOLAX) 10 MG SUPPOSITORY    Place 1 suppository (10 mg total) rectally daily as needed for moderate constipation.   DOCUSATE SODIUM (COLACE) 100 MG CAPSULE    Take 1 capsule (100 mg total) by mouth 2 (two) times daily. Continue this while taking narcotics to help with bowel movements   FENOFIBRATE 54 MG TABLET    Take 54 mg by mouth daily.   HYDROCODONE-ACETAMINOPHEN (NORCO) 5-325 MG PER TABLET    Take one tablet by mouth every 12 hours for pain. Not to exceed  of APAP from all sources/24h   IPRATROPIUM-ALBUTEROL (DUONEB) 0.5-2.5 (3) MG/3ML SOLN    Take 3 mLs by nebulization every 6 (six) hours as needed.   LORAZEPAM (ATIVAN) 0.5 MG TABLET    Take 1 tablet (0.5 mg total) by mouth every 12 (twelve) hours. Hold for sedation   METOPROLOL (LOPRESSOR) 50 MG TABLET    Take 50 mg by mouth daily.   MULTIPLE VITAMINS PO    Take 1 tablet by mouth daily.   POLYETHYLENE GLYCOL (MIRALAX / GLYCOLAX) PACKET    Take 17 g by mouth daily.   QUETIAPINE (SEROQUEL) 25 MG TABLET    Take 12.5-25 mg by mouth 2 (two) times daily. Take 12.5 mg daily and 25 mg in the afternoon   SENNA-DOCUSATE (SENOKOT-S) 8.6-50 MG PER TABLET    Take 2 tablets  by mouth at bedtime.   SODIUM PHOSPHATE (FLEET) 7-19 GM/118ML ENEM    Place 133 mLs (1 enema total) rectally daily as needed for severe constipation.   VITAMIN B-12 (CYANOCOBALAMIN) 1000 MCG TABLET    Take 1,000 mcg by mouth daily. For anemia  Modified Medications   No medications on file  Discontinued Medications   No medications on file     SIGNIFICANT DIAGNOSTIC EXAMS   09-17-14: chest x-ray; rotated kyphotic projection hyperinflation with clear lungs   04-25-15: chest x-ray: no acute cardiopulmonary process  LABS REVIEWED:    04-10-15: wbc 5.0; hgb 12.0; hct 37; mcv 89; plt 237; glucose 81; bun 19.1; creat 0.76; k+4.0; na++147; ca++11.0  liver normal albumin 3.5; hgb a1c 5.5; chol 179; ldl 86; trig 304; hdl 32   06-25-15: wbc 4.4; hgb 13.0; hct 41.4; mcv  91.2; plt 272      Review of Systems Unable to perform ROS: Dementia    Physical Exam Constitutional: No distress.  Frail   Eyes: Conjunctivae are normal.  Neck: Neck supple. No JVD present. No thyromegaly present.  Cardiovascular: Normal rate, regular rhythm and intact distal pulses.   Respiratory: Effort normal and breath sounds normal. No respiratory distress. She has no wheezes.  GI: Soft. Bowel sounds are normal. She exhibits no distension. There is no tenderness.  Musculoskeletal: She exhibits no edema.  Able to move all extremities   Lymphadenopathy:    She has no cervical adenopathy.  Neurological: She is alert.  Skin: Skin is warm and dry. She is not diaphoretic.  Psychiatric: She has a normal mood and affect.     ASSESSMENT/ PLAN:  1. Hypertension: will continue lopressor 50 mg daily; will monitor  2. Constipation; will continue colace twice daily; miralax daily; and senna s 2 tabs nightly   3. Anemia: is off medications;  her hgb is 13.0  4. Status post left hip fracture from August 2015: no change in recent status; is on long term pain medication; vicodin 5/325 mg twice daily and will monitor   5. Psychosis: does talk to herself; is unable answer questions; no recent reports of behavioral issues present; will continue seroquel 12.5 mg daily and 25 mg in the afternoon and will continue ativan 0.5 mg twice daily for anxiety and will monitor her status.   6. Dementia without behavioral disturbance: has declined in status; is presently not on medications; will not make changes will monitor  Her weight is 93 pounds and is without change.   7. Hypercalcemia: her calcium level is 11.0 . Her goals of care is comfort with her no hospitalization and dnr status.   8.  Hyperparathyroidism: Her PTH: is 160.3. She is not on medications; will not make changes will continue to monitor her status.   9. Hypertriglyceridemia: will continue fenofibrate 54 mg daily; her trig are  304.  10. FTT: her current weight is 93 pounds; her weight in June was 106 pounds. Will continue supplements per facility protocol; remeron was not effective in her gaining weight.     Will check cbc; cmp; pth      Synthia Innocenteborah Green NP [facility]  Contact [phone] Monday through Friday 8am- 5pm  After hours call [phone]

## 2015-11-01 ENCOUNTER — Encounter: Payer: Self-pay | Admitting: Adult Health

## 2015-11-01 DIAGNOSIS — I10 Essential (primary) hypertension: Secondary | ICD-10-CM | POA: Diagnosis not present

## 2015-11-01 DIAGNOSIS — N2581 Secondary hyperparathyroidism of renal origin: Secondary | ICD-10-CM | POA: Diagnosis not present

## 2015-11-01 DIAGNOSIS — I2510 Atherosclerotic heart disease of native coronary artery without angina pectoris: Secondary | ICD-10-CM | POA: Diagnosis not present

## 2015-11-01 LAB — HEPATIC FUNCTION PANEL
ALT: 19 U/L (ref 7–35)
AST: 24 U/L (ref 13–35)
Bilirubin, Total: 0.3 mg/dL

## 2015-11-01 LAB — CBC AND DIFFERENTIAL
HCT: 41 % (ref 36–46)
Hemoglobin: 12.8 g/dL (ref 12.0–16.0)
Platelets: 294 10*3/uL (ref 150–399)
WBC: 5.3 10^3/mL

## 2015-11-01 LAB — BASIC METABOLIC PANEL
BUN: 20 mg/dL (ref 4–21)
Creatinine: 0.7 mg/dL (ref 0.5–1.1)
Glucose: 94 mg/dL
Potassium: 3.8 mmol/L (ref 3.4–5.3)
Sodium: 149 mmol/L — AB (ref 137–147)

## 2015-11-01 NOTE — Progress Notes (Signed)
Patient ID: Pamela BashLinda B Boeve, female   DOB: 05-05-1936, 79 y.o.   MRN: [id number]  This encounter was created in error - please disregard.

## 2015-11-07 ENCOUNTER — Non-Acute Institutional Stay (SKILLED_NURSING_FACILITY): Payer: Medicare HMO | Admitting: Adult Health

## 2015-11-07 DIAGNOSIS — T17890A Other foreign object in other parts of respiratory tract causing asphyxiation, initial encounter: Secondary | ICD-10-CM | POA: Diagnosis not present

## 2015-11-07 DIAGNOSIS — T17928A Food in respiratory tract, part unspecified causing other injury, initial encounter: Secondary | ICD-10-CM

## 2015-11-07 DIAGNOSIS — J690 Pneumonitis due to inhalation of food and vomit: Secondary | ICD-10-CM | POA: Diagnosis not present

## 2015-11-07 DIAGNOSIS — T17920A Food in respiratory tract, part unspecified causing asphyxiation, initial encounter: Secondary | ICD-10-CM

## 2015-11-26 ENCOUNTER — Encounter: Payer: Self-pay | Admitting: Adult Health

## 2015-11-26 DIAGNOSIS — T17928A Food in respiratory tract, part unspecified causing other injury, initial encounter: Secondary | ICD-10-CM | POA: Insufficient documentation

## 2015-11-26 DIAGNOSIS — T17920A Food in respiratory tract, part unspecified causing asphyxiation, initial encounter: Secondary | ICD-10-CM | POA: Insufficient documentation

## 2015-11-26 NOTE — Progress Notes (Signed)
Patient ID: Pamela BashLinda B Heidenreich, female   DOB: 1936/07/29, 80 y.o.   MRN: [id number]    Facility:  [facility]      No Known Allergies  Chief Complaint  Patient presents with  . Acute Visit    aspiration     HPI:  She has aspirated. Was found after lunch slumped over; the heimlich maneuver was used successfully. It has taken her several minutes to become responsive; the staff is concerned that food may have gone into her lungs.     Past Medical History  Diagnosis Date  . Anemia   . Osteoporosis   . Coronary artery disease   . Dementia   . Hypertension   . Leukocytosis 07/26/2012  . Hyperlipidemia 07/26/2012  . Pernicious anemia 07/26/2012  . HTN (hypertension) 07/26/2012  . CAD (coronary artery disease) 07/26/2012    Cath 12/26/05: LAD 10-15% mid stenosis and distally, LCIRC small and patent, RCA 15-20% mid stenosis. EF 45%, LV with mild anterolateral and mid inferior wall hypokinesia  Myoview : 12/13/2009: No evidence of inducible reversible ischemia EF 77%  . Anxiety 07/26/2012  . History of tobacco use 07/26/2012  . Hyperparathyroidism (HCC) 07/26/2012  . Vitamin D deficiency 07/26/2012  . Closed intertrochanteric fracture of right hip (HCC) 07/26/2012    Past Surgical History  Procedure Laterality Date  . Tonsillectomy    . Intramedullary (im) nail intertrochanteric Left 07/20/2014    Procedure: INTRAMEDULLARY (IM) NAIL INTERTROCHANTRIC;  Surgeon: Sheral Apleyimothy D Murphy, MD;  Location: [facility] OR;  Service: Orthopedics;  Laterality: Left;    VITAL SIGNS BP 100/60 mmHg  Pulse 79  Ht 5\' 1"  (1.549 m)  Wt 93 lb (42.185 kg)  BMI 17.58 kg/m2  SpO2 91%  Patient's Medications  New Prescriptions   No medications on file  Previous Medications   ACETAMINOPHEN (TYLENOL) 325 MG TABLET    Take 2 tablets (650 mg total) by mouth every 6 (six) hours as needed for mild pain (or Fever >/= 101).   BISACODYL (DULCOLAX) 10 MG SUPPOSITORY    Place 1 suppository (10 mg total) rectally daily as needed for moderate  constipation.   DOCUSATE SODIUM (COLACE) 100 MG CAPSULE    Take 1 capsule (100 mg total) by mouth 2 (two) times daily. Continue this while taking narcotics to help with bowel movements   FENOFIBRATE 54 MG TABLET    Take 54 mg by mouth daily.   HYDROCODONE-ACETAMINOPHEN (NORCO) 5-325 MG PER TABLET    Take one tablet by mouth every 12 hours for pain. Not to exceed 3000mg  of APAP from all sources/24h   IPRATROPIUM-ALBUTEROL (DUONEB) 0.5-2.5 (3) MG/3ML SOLN    Take 3 mLs by nebulization every 6 (six) hours as needed.   LORAZEPAM (ATIVAN) 0.5 MG TABLET    Take 1 tablet (0.5 mg total) by mouth every 12 (twelve) hours. Hold for sedation   METOPROLOL (LOPRESSOR) 50 MG TABLET    Take 50 mg by mouth daily.   MULTIPLE VITAMINS PO    Take 1 tablet by mouth daily.   POLYETHYLENE GLYCOL (MIRALAX / GLYCOLAX) PACKET    Take 17 g by mouth daily.   QUETIAPINE (SEROQUEL) 25 MG TABLET    Take 12.5-25 mg by mouth 2 (two) times daily. Take 12.5 mg daily and 25 mg in the afternoon   SENNA-DOCUSATE (SENOKOT-S) 8.6-50 MG PER TABLET    Take 2 tablets by mouth at bedtime.   SODIUM PHOSPHATE (FLEET) 7-19 GM/118ML ENEM    Place 133 mLs (1 enema  total) rectally daily as needed for severe constipation.   VITAMIN B-12 (CYANOCOBALAMIN) 1000 MCG TABLET    Take 1,000 mcg by mouth daily. For anemia  Modified Medications   No medications on file  Discontinued Medications   No medications on file     SIGNIFICANT DIAGNOSTIC EXAMS  09-17-14: chest x-ray; rotated kyphotic projection hyperinflation with clear lungs   04-25-15: chest x-ray: no acute cardiopulmonary process  LABS REVIEWED:    04-10-15: wbc 5.0; hgb 12.0; hct 37; mcv 89; plt 237; glucose 81; bun 19.1; creat 0.76; k+4.0; na++147; ca++11.0  liver normal albumin 3.5; hgb a1c 5.5; chol 179; ldl 86; trig 304; hdl 32   06-25-15: wbc 4.4; hgb 13.0; hct 41.4; mcv 91.2; plt 272      Review of Systems Unable to perform ROS: Dementia    Physical Exam Constitutional: No  distress.  Frail   Eyes: Conjunctivae are normal.  Neck: Neck supple. No JVD present. No thyromegaly present.  Cardiovascular: Normal rate, regular rhythm and intact distal pulses.   Respiratory: Effort normal . No respiratory distress. She has no wheezes. breath sounds diminished GI: Soft. Bowel sounds are normal. She exhibits no distension. There is no tenderness.  Musculoskeletal: She exhibits no edema.  Able to move all extremities   Lymphadenopathy:    She has no cervical adenopathy.  Neurological: She is alert.  Skin: Skin is warm and dry. She is not diaphoretic.  Psychiatric: She has a normal mood and affect.      ASSESSMENT/ PLAN:  1. Aspiration of food: will get a chest x-ray; will have speech see her for further evaluation and treatment options.  Will treat as indicated    Time spent with patient  30  minutes >50% time spent counseling; reviewing medical record; tests; labs; and developing future plan of care   Synthia Innocent NP [facility]  Contact [phone] Monday through Friday 8am- 5pm  After hours call [phone]

## 2015-12-05 ENCOUNTER — Encounter: Payer: Self-pay | Admitting: Adult Health

## 2015-12-05 ENCOUNTER — Non-Acute Institutional Stay (SKILLED_NURSING_FACILITY): Payer: Medicare HMO | Admitting: Adult Health

## 2015-12-05 DIAGNOSIS — I10 Essential (primary) hypertension: Secondary | ICD-10-CM | POA: Diagnosis not present

## 2015-12-05 DIAGNOSIS — E213 Hyperparathyroidism, unspecified: Secondary | ICD-10-CM | POA: Diagnosis not present

## 2015-12-05 DIAGNOSIS — F29 Unspecified psychosis not due to a substance or known physiological condition: Secondary | ICD-10-CM | POA: Diagnosis not present

## 2015-12-05 DIAGNOSIS — F0390 Unspecified dementia without behavioral disturbance: Secondary | ICD-10-CM

## 2015-12-05 DIAGNOSIS — E781 Pure hyperglyceridemia: Secondary | ICD-10-CM

## 2015-12-05 DIAGNOSIS — D510 Vitamin B12 deficiency anemia due to intrinsic factor deficiency: Secondary | ICD-10-CM

## 2015-12-05 DIAGNOSIS — E785 Hyperlipidemia, unspecified: Secondary | ICD-10-CM | POA: Diagnosis not present

## 2015-12-05 DIAGNOSIS — R69 Illness, unspecified: Secondary | ICD-10-CM | POA: Diagnosis not present

## 2015-12-05 NOTE — Progress Notes (Signed)
Patient ID: Pamela Blackburn, female   DOB: [birth year], 80 y.o.   MRN: [id number]   Facility:  [facility]      No Known Allergies  Chief Complaint  Patient presents with  . Medical Management of Chronic Issues    HPI:  Her weight in June was 106 pounds her current weight is 93 pounds. Her MOST form is for comfort care to avoid hospitalizations and no feeding tube.  Past Medical History  Diagnosis Date  . Anemia   . Osteoporosis   . Coronary artery disease   . Dementia   . Hypertension   . Leukocytosis 07/26/2012  . Hyperlipidemia 07/26/2012  . Pernicious anemia 07/26/2012  . HTN (hypertension) 07/26/2012  . CAD (coronary artery disease) 07/26/2012    Cath 12/26/05: LAD 10-15% mid stenosis and distally, LCIRC small and patent, RCA 15-20% mid stenosis. EF 45%, LV with mild anterolateral and mid inferior wall hypokinesia  Myoview : 12/13/2009: No evidence of inducible reversible ischemia EF 77%  . Anxiety 07/26/2012  . History of tobacco use 07/26/2012  . Hyperparathyroidism (HCC) 07/26/2012  . Vitamin D deficiency 07/26/2012  . Closed intertrochanteric fracture of right hip (HCC) 07/26/2012    Past Surgical History  Procedure Laterality Date  . Tonsillectomy    . Intramedullary (im) nail intertrochanteric Left 07/20/2014    Procedure: INTRAMEDULLARY (IM) NAIL INTERTROCHANTRIC;  Surgeon: Sheral Apley, MD;  Location: [facility] OR;  Service: Orthopedics;  Laterality: Left;    VITAL SIGNS BP 121/88 mmHg  Pulse 81  Ht 5\' 1"  (1.549 m)  Wt 93 lb (42.185 kg)  BMI 17.58 kg/m2  Patient's Medications  New Prescriptions   No medications on file  Previous Medications   DOCUSATE SODIUM (COLACE) 100 MG CAPSULE    Take 1 capsule (100 mg total) by mouth 2 (two) times daily. Continue this while taking narcotics to help with bowel movements   FENOFIBRATE 54 MG TABLET    Take 54 mg by mouth daily.   HYDROCODONE-ACETAMINOPHEN (NORCO) 5-325 MG PER TABLET    Take one tablet by mouth every 12 hours for pain. Not  to exceed 3000mg  of APAP from all sources/24h   IPRATROPIUM-ALBUTEROL (DUONEB) 0.5-2.5 (3) MG/3ML SOLN    Take 3 mLs by nebulization every 6 (six) hours as needed.   LORAZEPAM (ATIVAN) 0.5 MG TABLET    Take 1 tablet (0.5 mg total) by mouth every 12 (twelve) hours. Hold for sedation   METOPROLOL (LOPRESSOR) 50 MG TABLET    Take 50 mg by mouth daily.   MULTIPLE VITAMINS PO    Take 1 tablet by mouth daily.   POLYETHYLENE GLYCOL (MIRALAX / GLYCOLAX) PACKET    Take 17 g by mouth daily.   QUETIAPINE (SEROQUEL) 25 MG TABLET    Take 25 mg by mouth daily. Take 25 mg in the afternoon   SENNA-DOCUSATE (SENOKOT-S) 8.6-50 MG PER TABLET    Take 2 tablets by mouth at bedtime.   VITAMIN B-12 (CYANOCOBALAMIN) 1000 MCG TABLET    Take 1,000 mcg by mouth daily. For anemia  Modified Medications   No medications on file  Discontinued Medications     SIGNIFICANT DIAGNOSTIC EXAMS   09-17-14: chest x-ray; rotated kyphotic projection hyperinflation with clear lungs   04-25-15: chest x-ray: no acute cardiopulmonary process  LABS REVIEWED:    04-10-15: wbc 5.0; hgb 12.0; hct 37; mcv 89; plt 237; glucose 81; bun 19.1; creat 0.76; k+4.0; na++147; ca++11.0  liver normal albumin 3.5; hgb a1c 5.5; chol 179;  ldl 86; trig 304; hdl 32   06-25-15: wbc 4.4; hgb 13.0; hct 41.4; mcv 91.2; plt 272  11-01-15: wbc 5.3; hgb 12.8; hct 41.1; mcv 88.2; plt 294; glucose 94; bun 20.1; creat 0.68; k+ 3.8; na++149; liver normal albumin 4.0      Review of Systems Unable to perform ROS: Dementia    Physical Exam Constitutional: No distress.  Frail   Eyes: Conjunctivae are normal.  Neck: Neck supple. No JVD present. No thyromegaly present.  Cardiovascular: Normal rate, regular rhythm and intact distal pulses.   Respiratory: Effort normal and breath sounds normal. No respiratory distress. She has no wheezes.  GI: Soft. Bowel sounds are normal. She exhibits no distension. There is no tenderness.  Musculoskeletal: She exhibits no  edema.  Able to move all extremities   Lymphadenopathy:    She has no cervical adenopathy.  Neurological: She is alert.  Skin: Skin is warm and dry. She is not diaphoretic.  Psychiatric: She has a normal mood and affect.     ASSESSMENT/ PLAN:  1. Hypertension: will continue lopressor 50 mg daily; will monitor  2. Constipation; will continue  miralax daily; and senna s 2 tabs nightly   3. Anemia: is off medications;  her hgb is 12.8  4. Status post left hip fracture from August 2015: no change in recent status; is on long term pain medication; vicodin 5/325 mg twice daily and will monitor   5. Psychosis: does talk to herself; is unable answer questions; no recent reports of behavioral issues present; will continue seroquel  25 mg in the afternoon and will continue ativan 0.5 mg twice daily for anxiety and will monitor her status.   6. Dementia without behavioral disturbance: has declined in status; is presently not on medications; will not make changes will monitor  Her weight is 93 pounds and is without change.  Her disease state is end stage.   7. Hypercalcemia: her calcium level is 11.9 . Her goals of care is comfort with her no hospitalization and dnr status.   8.  Hyperparathyroidism: Her PTH: is 160.3. She is not on medications; will not make changes will continue to monitor her status.   9. Hypertriglyceridemia: will continue fenofibrate 54 mg daily; her trig are 304.  10. FTT: her current weight is 93 pounds; her weight in June 2016  was 106 pounds. Will continue supplements per facility protocol; remeron was not effective in her gaining weight.     Will check pth      Synthia Innocenteborah Deshawnda Acrey NP [facility]  Contact [phone] Monday through Friday 8am- 5pm  After hours call [phone]

## 2015-12-07 DIAGNOSIS — N2581 Secondary hyperparathyroidism of renal origin: Secondary | ICD-10-CM | POA: Diagnosis not present

## 2015-12-11 DIAGNOSIS — M79671 Pain in right foot: Secondary | ICD-10-CM | POA: Diagnosis not present

## 2015-12-11 DIAGNOSIS — I2510 Atherosclerotic heart disease of native coronary artery without angina pectoris: Secondary | ICD-10-CM | POA: Diagnosis not present

## 2015-12-11 DIAGNOSIS — B351 Tinea unguium: Secondary | ICD-10-CM | POA: Diagnosis not present

## 2015-12-11 DIAGNOSIS — M79672 Pain in left foot: Secondary | ICD-10-CM | POA: Diagnosis not present

## 2015-12-11 LAB — HM DIABETES FOOT EXAM

## 2015-12-27 ENCOUNTER — Encounter: Payer: Self-pay | Admitting: Internal Medicine

## 2015-12-27 ENCOUNTER — Non-Acute Institutional Stay (SKILLED_NURSING_FACILITY): Payer: Medicare HMO | Admitting: Internal Medicine

## 2015-12-27 DIAGNOSIS — I10 Essential (primary) hypertension: Secondary | ICD-10-CM

## 2015-12-27 DIAGNOSIS — F29 Unspecified psychosis not due to a substance or known physiological condition: Secondary | ICD-10-CM | POA: Diagnosis not present

## 2015-12-27 DIAGNOSIS — R69 Illness, unspecified: Secondary | ICD-10-CM | POA: Diagnosis not present

## 2015-12-27 DIAGNOSIS — F0390 Unspecified dementia without behavioral disturbance: Secondary | ICD-10-CM | POA: Diagnosis not present

## 2015-12-27 NOTE — Progress Notes (Signed)
MRN: [id number] Name: Pamela Blackburn  Sex: female Age: 80 y.o. DOB: 06-29-1936  PSC #: Ronni Rumble Facility/Room:212 Level Of Care: SNF Provider: Merrilee Seashore D Emergency Contacts: Extended Emergency Contact Information Primary Emergency Contact: Pintor,Lisa Address: [address]          [place], [place] [address] [place] Home Phone: [phone] Relation: Daughter Secondary Emergency Contact: Jones,Hunter  [place] Home Phone: [phone] Relation: Relative  Code Status:   Allergies: Review of patient's allergies indicates no known allergies.  Chief Complaint  Patient presents with  . Medical Management of Chronic Issues    HPI: Patient is 80 y.o. female who is being seen for routine issues of HTN, dementia and psychosis.  Past Medical History  Diagnosis Date  . Anemia   . Osteoporosis   . Coronary artery disease   . Dementia   . Hypertension   . Leukocytosis 07/26/2012  . Hyperlipidemia 07/26/2012  . Pernicious anemia 07/26/2012  . HTN (hypertension) 07/26/2012  . CAD (coronary artery disease) 07/26/2012    Cath 12/26/05: LAD 10-15% mid stenosis and distally, LCIRC small and patent, RCA 15-20% mid stenosis. EF 45%, LV with mild anterolateral and mid inferior wall hypokinesia  Myoview : 12/13/2009: No evidence of inducible reversible ischemia EF 77%  . Anxiety 07/26/2012  . History of tobacco use 07/26/2012  . Hyperparathyroidism (HCC) 07/26/2012  . Vitamin D deficiency 07/26/2012  . Closed intertrochanteric fracture of right hip (HCC) 07/26/2012    Past Surgical History  Procedure Laterality Date  . Tonsillectomy    . Intramedullary (im) nail intertrochanteric Left 07/20/2014    Procedure: INTRAMEDULLARY (IM) NAIL INTERTROCHANTRIC;  Surgeon: Sheral Apley, MD;  Location: [facility] OR;  Service: Orthopedics;  Laterality: Left;      Medication List       This list is accurate as of: 12/27/15 11:59 PM.  Always use your most recent med list.                docusate sodium 100 MG capsule  Commonly known as:  COLACE  Take 1 capsule (100 mg total) by mouth 2 (two) times daily. Continue this while taking narcotics to help with bowel movements     fenofibrate 54 MG tablet  Take 54 mg by mouth daily.     HYDROcodone-acetaminophen 5-325 MG tablet  Commonly known as:  NORCO  Take one tablet by mouth every 12 hours for pain. Not to exceed  of APAP from all sources/24h     ipratropium-albuterol 0.5-2.5 (3) MG/3ML Soln  Commonly known as:  DUONEB  Take 3 mLs by nebulization every 6 (six) hours as needed.     LORazepam 0.5 MG tablet  Commonly known as:  ATIVAN  Take 1 tablet (0.5 mg total) by mouth every 12 (twelve) hours. Hold for sedation     metoprolol 50 MG tablet  Commonly known as:  LOPRESSOR  Take 50 mg by mouth daily.     MULTIPLE VITAMINS PO  Take 1 tablet by mouth daily.     polyethylene glycol packet  Commonly known as:  MIRALAX / GLYCOLAX  Take 17 g by mouth daily.     QUEtiapine 25 MG tablet  Commonly known as:  SEROQUEL  Take 25 mg by mouth daily. Take 25 mg in the afternoon     senna-docusate 8.6-50 MG tablet  Commonly known as:  Senokot-S  Take 2 tablets by mouth at bedtime.     vitamin B-12 1000 MCG tablet  Commonly known as:  CYANOCOBALAMIN  Take 1,000 mcg by mouth daily. For anemia        No orders of the defined types were placed in this encounter.    Immunization History  Administered Date(s) Administered  . Influenza-Unspecified 08/09/2015  . Pneumococcal Polysaccharide-23 08/02/2012  . Pneumococcal-Unspecified 01/22/2015    Social History  Substance Use Topics  . Smoking status: Former Smoker -- 0.50 packs/day    Types: Cigarettes  . Smokeless tobacco: Not on file  . Alcohol Use: No    Review of Systems UTO 2/2 dementia; nursing without concerns    Filed Vitals:   12/27/15 1531  BP: 118/78  Pulse: 78  Temp: 96.7 F (35.9 C)  Resp: 18    Physical Exam  GENERAL  APPEARANCE: Alert, conversant,thin WF, No acute distress  SKIN: No diaphoresis rash HEENT: Unremarkable RESPIRATORY: Breathing is even, unlabored. Lung sounds are clear   CARDIOVASCULAR: Heart RRR no murmurs, rubs or gallops. No peripheral edema  GASTROINTESTINAL: Abdomen is soft, non-tender, not distended w/ normal bowel sounds.  GENITOURINARY: Bladder non tender, not distended  MUSCULOSKELETAL: No abnormal joints or musculature NEUROLOGIC: Cranial nerves 2-12 grossly intact. Moves all extremities PSYCHIATRIC: pt is speaking but it makes no sense, no behavioral issues  Patient Active Problem List   Diagnosis Date Noted  . Aspiration of food 11/26/2015  . Hypertriglyceridemia 10/31/2015  . Psychosis 10/25/2014  . Constipation 10/25/2014  . Intertrochanteric fracture of left femur (HCC) 07/19/2014  . Hypercalcemia 07/26/2012  . Hyperlipidemia 07/26/2012  . Pernicious anemia 07/26/2012  . Dementia without behavioral disturbance 07/26/2012  . Osteoporosis 07/26/2012  . HTN (hypertension) 07/26/2012  . CAD (coronary artery disease) 07/26/2012  . Anxiety 07/26/2012  . Hyperparathyroidism (HCC) 07/26/2012  . Vitamin D deficiency 07/26/2012    CBC    Component Value Date/Time   WBC 7.0 07/24/2014 0431   RBC 3.48* 07/24/2014 0431   HGB 10.1* 07/24/2014 0431   HCT 30.5* 07/24/2014 0431   PLT 239 07/24/2014 0431   MCV 87.6 07/24/2014 0431   LYMPHSABS 1.1 07/19/2014 0108   MONOABS 0.7 07/19/2014 0108   EOSABS 0.0 07/19/2014 0108   BASOSABS 0.0 07/19/2014 0108    CMP     Component Value Date/Time   NA 135* 07/24/2014 0431   K 3.9 07/24/2014 0431   CL 99 07/24/2014 0431   CO2 26 07/24/2014 0431   GLUCOSE 109* 07/24/2014 0431   BUN 15 07/24/2014 0431   CREATININE 0.67 07/24/2014 0431   CALCIUM 10.6* 07/24/2014 0431   CALCIUM 11.0* 07/26/2012 1503   PROT 7.4 07/19/2014 0108   ALBUMIN 3.7 07/19/2014 0108   AST 21 07/19/2014 0108   ALT 12 07/19/2014 0108   ALKPHOS 149*  07/19/2014 0108   BILITOT 0.3 07/19/2014 0108   GFRNONAA 84* 07/24/2014 0431   GFRAA >90 07/24/2014 0431    Assessment and Plan  HTN (hypertension) Stable; continue lopressor 50 mg daily; will monitor  Dementia without behavioral disturbance has declined in status; is presently not on medications; will not make changes will monitor Her weight is 93 pounds and is without change. Her disease state is end stage.    Psychosis does talk to herself; is unable answer questions; no recent reports of behavioral issues present; will continue seroquel 25 mg in the afternoon and will continue ativan 0.5 mg twice daily for anxiety and will monitor her status.     Margit Hanks, MD

## 2015-12-30 ENCOUNTER — Encounter: Payer: Self-pay | Admitting: Internal Medicine

## 2015-12-30 NOTE — Assessment & Plan Note (Signed)
Stable; continue lopressor 50 mg daily; will monitor

## 2015-12-30 NOTE — Assessment & Plan Note (Signed)
does talk to herself; is unable answer questions; no recent reports of behavioral issues present; will continue seroquel 25 mg in the afternoon and will continue ativan 0.5 mg twice daily for anxiety and will monitor her status.

## 2015-12-30 NOTE — Assessment & Plan Note (Signed)
has declined in status; is presently not on medications; will not make changes will monitor Her weight is 93 pounds and is without change. Her disease state is end stage.

## 2016-01-22 ENCOUNTER — Encounter: Payer: Self-pay | Admitting: Adult Health

## 2016-01-22 LAB — PARATHYROID HORMONE, INTACT (NO CA): PTH: 88.79 pg/mL

## 2016-01-23 ENCOUNTER — Encounter: Payer: Self-pay | Admitting: Adult Health

## 2016-01-23 ENCOUNTER — Non-Acute Institutional Stay (SKILLED_NURSING_FACILITY): Payer: Medicare HMO | Admitting: Adult Health

## 2016-01-23 DIAGNOSIS — I10 Essential (primary) hypertension: Secondary | ICD-10-CM

## 2016-01-23 DIAGNOSIS — F29 Unspecified psychosis not due to a substance or known physiological condition: Secondary | ICD-10-CM

## 2016-01-23 DIAGNOSIS — E213 Hyperparathyroidism, unspecified: Secondary | ICD-10-CM | POA: Diagnosis not present

## 2016-01-23 DIAGNOSIS — R69 Illness, unspecified: Secondary | ICD-10-CM | POA: Diagnosis not present

## 2016-01-23 DIAGNOSIS — E781 Pure hyperglyceridemia: Secondary | ICD-10-CM | POA: Diagnosis not present

## 2016-01-23 DIAGNOSIS — F0390 Unspecified dementia without behavioral disturbance: Secondary | ICD-10-CM

## 2016-01-23 NOTE — Progress Notes (Signed)
Patient ID: Pamela Blackburn, female   DOB: 09/02/1936, 80 y.o.   MRN: [id number]   Facility:  [facility]       No Known Allergies  Chief Complaint  Patient presents with  . Medical Management of Chronic Issues    Follow up    HPI:  She is a long term resident of this facility being seen for the management of her chronic illnesses. Overall there is little change in her status. She does continue to slowly loss weight. Her dementia is end stage; and weight loss is an expected out come.  She is unable to participate in the hpi or ros. There are no nursing concerns at this time.    Past Medical History  Diagnosis Date  . Anemia   . Osteoporosis   . Coronary artery disease   . Dementia   . Hypertension   . Leukocytosis 07/26/2012  . Hyperlipidemia 07/26/2012  . Pernicious anemia 07/26/2012  . HTN (hypertension) 07/26/2012  . CAD (coronary artery disease) 07/26/2012    Cath 12/26/05: LAD 10-15% mid stenosis and distally, LCIRC small and patent, RCA 15-20% mid stenosis. EF 45%, LV with mild anterolateral and mid inferior wall hypokinesia  Myoview : 12/13/2009: No evidence of inducible reversible ischemia EF 77%  . Anxiety 07/26/2012  . History of tobacco use 07/26/2012  . Hyperparathyroidism (HCC) 07/26/2012  . Vitamin D deficiency 07/26/2012  . Closed intertrochanteric fracture of right hip (HCC) 07/26/2012    Past Surgical History  Procedure Laterality Date  . Tonsillectomy    . Intramedullary (im) nail intertrochanteric Left 07/20/2014    Procedure: INTRAMEDULLARY (IM) NAIL INTERTROCHANTRIC;  Surgeon: Sheral Apley, MD;  Location: [facility] OR;  Service: Orthopedics;  Laterality: Left;    VITAL SIGNS BP 148/66 mmHg  Pulse 64  Temp(Src) 97.1 F (36.2 C) (Oral)  Resp 20  Ht  (1.549 m)  Wt 90 lb (40.824 kg)  BMI 17.01 kg/m2  SpO2 99%  Patient's Medications  New Prescriptions   No medications on file  Previous Medications   DOCUSATE SODIUM (COLACE) 100 MG CAPSULE    Take 1 capsule (100  mg total) by mouth 2 (two) times daily. Continue this while taking narcotics to help with bowel movements   FENOFIBRATE 54 MG TABLET    Take 54 mg by mouth daily.   HYDROCODONE-ACETAMINOPHEN (NORCO) 5-325 MG PER TABLET    Take one tablet by mouth every 12 hours for pain. Not to exceed  of APAP from all sources/24h   IPRATROPIUM-ALBUTEROL (DUONEB) 0.5-2.5 (3) MG/3ML SOLN    Take 3 mLs by nebulization every 6 (six) hours as needed.   LORAZEPAM (ATIVAN) 0.5 MG TABLET    Take 1 tablet (0.5 mg total) by mouth every 12 (twelve) hours. Hold for sedation   METOPROLOL (LOPRESSOR) 50 MG TABLET    Take 50 mg by mouth daily.   MULTIPLE VITAMINS PO    Take 1 tablet by mouth daily.   POLYETHYLENE GLYCOL (MIRALAX / GLYCOLAX) PACKET    Take 17 g by mouth daily.   QUETIAPINE (SEROQUEL) 25 MG TABLET    Take 25 mg by mouth daily. Take 25 mg in the afternoon   SENNA-DOCUSATE (SENOKOT-S) 8.6-50 MG PER TABLET    Take 2 tablets by mouth at bedtime.   VITAMIN B-12 (CYANOCOBALAMIN) 1000 MCG TABLET    Take 1,000 mcg by mouth daily. For anemia  Modified Medications   No medications on file  Discontinued Medications   No medications on  file     SIGNIFICANT DIAGNOSTIC EXAMS  09-17-14: chest x-ray; rotated kyphotic projection hyperinflation with clear lungs   04-25-15: chest x-ray: no acute cardiopulmonary process  11-07-15: chest x-ray; no acute cardiopulmonary disease process   LABS REVIEWED:    04-10-15: wbc 5.0; hgb 12.0; hct 37; mcv 89; plt 237; glucose 81; bun 19.1; creat 0.76; k+4.0; na++147; ca++11.0  liver normal albumin 3.5; hgb a1c 5.5; chol 179; ldl 86; trig 304; hdl 32   06-25-15: wbc 4.4; hgb 13.0; hct 41.4; mcv 91.2; plt 272  11-01-15: wbc 5.3; hgb 12.8; hct 41.1; mcv 88.2; plt 294; glucose 94; bun 20.1; creat 0.68; k+ 3.8; na++149; liver normal albumin 4.0  12-07-15: pth 88.790     Review of Systems Unable to perform ROS: Dementia    Physical Exam Constitutional: No distress.  Frail     Eyes: Conjunctivae are normal.  Neck: Neck supple. No JVD present. No thyromegaly present.  Cardiovascular: Normal rate, regular rhythm and intact distal pulses.   Respiratory: Effort normal and breath sounds normal. No respiratory distress. She has no wheezes.  GI: Soft. Bowel sounds are normal. She exhibits no distension. There is no tenderness.  Musculoskeletal: She exhibits no edema.  Able to move all extremities   Lymphadenopathy:    She has no cervical adenopathy.  Neurological: She is alert.  Skin: Skin is warm and dry. She is not diaphoretic.  Psychiatric: She has a normal mood and affect.     ASSESSMENT/ PLAN:  1. Hypertension: will continue lopressor 50 mg daily; will monitor  2. Constipation; will continue  miralax daily; and senna s 2 tabs nightly   3. Anemia: is off medications;  her hgb is 12.8  4. Status post left hip fracture from August 2015: no change in recent status; is on long term pain medication; vicodin 5/325 mg twice daily and will monitor   5. Psychosis: does talk to herself; is unable answer questions; no recent reports of behavioral issues present; will continue seroquel  25 mg in the afternoon and will continue ativan 0.5 mg twice daily for anxiety and will monitor her status.   6. Dementia without behavioral disturbance: has declined in status; is presently not on medications; will not make changes will monitor  Her weight is 90 pounds and is without change.  Her disease state is end stage.  Weight loss is an expected outcome at the end stage of this disease.   7. Hypercalcemia: her calcium level is 11.9 . Her goals of care is comfort with her no hospitalization and dnr status.   8.  Hyperparathyroidism: Her PTH: is 88.790. She is not on medications; will not make changes will continue to monitor her status.   9. Hypertriglyceridemia: will continue fenofibrate 54 mg daily; her trig are 304.  10. FTT: her current weight is 90 pounds; her weight in  June 2016  was 106 pounds. Will continue supplements per facility protocol; remeron was not effective in her gaining weight.          Synthia Innocent NP [facility]  Contact [phone] Monday through Friday 8am- 5pm  After hours call [phone]

## 2016-02-15 NOTE — Progress Notes (Signed)
Patient ID: Pamela Blackburn, female   DOB: 06-17-1936, 80 y.o.   MRN: [id number]    DATE:08/27/15  Location:  [place] [facility] Service: SNF (31)   Extended Emergency Contact Information Primary Emergency Contact: Pirie,Lisa Address: [address]          [place], [place] [address] [place] Phone: [phone] Relation: Daughter Secondary Emergency Contact: Loraine [place] Phone: [phone] Relation: Relative  Advanced Directive information  DNR; MOST FORM - NO FT  Chief Complaint  Patient presents with  . Medical Management of Chronic Issues    HPI:  80 yo female long term resident seen today for f/u. She has no c/o. No nursing issues. She is a poor historian due to dementia. Hx obtained from the chart  Dementia/psychosis - weight stable on remeron and 2 cal nutritional supplement. She takes seroquel for psychosis and lorazepam  Anemia - hgb 13 and stable on B12 supplement  HTN - BP controlled on metoprolol  hyperlipdemia - stable on fenofibrate  Chronic pain/right hip fx/left femur fx - stable on norco  Constipation - stable on colace, senna and miralax. Uses dulcolax and fleets enema prn  Elevated Ca2+/hyperparathyroidism - stable  Past Medical History  Diagnosis Date  . Anemia   . Osteoporosis   . Coronary artery disease   . Dementia   . Hypertension   . Leukocytosis 07/26/2012  . Hyperlipidemia 07/26/2012  . Pernicious anemia 07/26/2012  . HTN (hypertension) 07/26/2012  . CAD (coronary artery disease) 07/26/2012    Cath 12/26/05: LAD 10-15% mid stenosis and distally, LCIRC small and patent, RCA 15-20% mid stenosis. EF 45%, LV with mild anterolateral and mid inferior wall hypokinesia  Myoview : 12/13/2009: No evidence of inducible reversible ischemia EF 77%  . Anxiety 07/26/2012  . History of tobacco use 07/26/2012  . Hyperparathyroidism ([place]) 07/26/2012  . Vitamin D deficiency 07/26/2012  . Closed  intertrochanteric fracture of right hip ([place]) 07/26/2012    Past Surgical History  Procedure Laterality Date  . Tonsillectomy    . Intramedullary (im) nail intertrochanteric Left 07/20/2014    Procedure: INTRAMEDULLARY (IM) NAIL INTERTROCHANTRIC;  Surgeon: Renette Butters, MD;  Location: [facility];  Service: Orthopedics;  Laterality: Left;    Patient Care Team: Hennie Duos, MD as PCP - General (Internal Medicine) Gerlene Fee, NP as Nurse Practitioner (Geriatric Medicine) [place] ([place])  Social History   Social History  . Marital Status: Legally Separated    Spouse Name: N/A  . Number of Children: N/A  . Years of Education: N/A   Occupational History  . Not on file.   Social History Main Topics  . Smoking status: Former Smoker -- 0.50 packs/day    Types: Cigarettes  . Smokeless tobacco: Not on file  . Alcohol Use: No  . Drug Use: No  . Sexual Activity: Not on file   Other Topics Concern  . Not on file   Social History Narrative     reports that she has quit smoking. Her smoking use included Cigarettes. She smoked 0.50 packs per day. She does not have any smokeless tobacco history on file. She reports that she does not drink alcohol or use illicit drugs.  Immunization History  Administered Date(s) Administered  . Influenza-Unspecified 08/09/2015  . Pneumococcal Polysaccharide-23 08/02/2012  . Pneumococcal-Unspecified 01/22/2015    No Known Allergies  Medications: Patient's Medications  New  Prescriptions   No medications on file  Previous Medications   DOCUSATE SODIUM (COLACE) 100 MG CAPSULE    Take 1 capsule (100 mg total) by mouth 2 (two) times daily. Continue this while taking narcotics to help with bowel movements   FENOFIBRATE 54 MG TABLET    Take 54 mg by mouth daily.   HYDROCODONE-ACETAMINOPHEN (NORCO) 5-325 MG PER TABLET    Take one tablet by mouth every 12 hours for pain. Not to exceed 3041m of APAP from  all sources/24h   IPRATROPIUM-ALBUTEROL (DUONEB) 0.5-2.5 (3) MG/3ML SOLN    Take 3 mLs by nebulization every 6 (six) hours as needed.   LORAZEPAM (ATIVAN) 0.5 MG TABLET    Take 1 tablet (0.5 mg total) by mouth every 12 (twelve) hours. Hold for sedation   METOPROLOL (LOPRESSOR) 50 MG TABLET    Take 50 mg by mouth daily.   MULTIPLE VITAMINS PO    Take 1 tablet by mouth daily.   POLYETHYLENE GLYCOL (MIRALAX / GLYCOLAX) PACKET    Take 17 g by mouth daily.   QUETIAPINE (SEROQUEL) 25 MG TABLET    Take 25 mg by mouth daily. Take 25 mg in the afternoon   SENNA-DOCUSATE (SENOKOT-S) 8.6-50 MG PER TABLET    Take 2 tablets by mouth at bedtime.   VITAMIN B-12 (CYANOCOBALAMIN) 1000 MCG TABLET    Take 1,000 mcg by mouth daily. For anemia  Modified Medications   No medications on file  Discontinued Medications   ACETAMINOPHEN (TYLENOL) 325 MG TABLET    Take 2 tablets (650 mg total) by mouth every 6 (six) hours as needed for mild pain (or Fever >/= 101).   BISACODYL (DULCOLAX) 10 MG SUPPOSITORY    Place 1 suppository (10 mg total) rectally daily as needed for moderate constipation.   SODIUM PHOSPHATE (FLEET) 7-19 GM/118ML ENEM    Place 133 mLs (1 enema total) rectally daily as needed for severe constipation.    Review of Systems  Unable to perform ROS: Dementia    Filed Vitals:   08/30/15 1155  BP: 156/81  Pulse: 73  Temp: 98 F (36.7 C)  Weight: 95 lb (43.092 kg)  SpO2: 98%   Body mass index is 17.96 kg/(m^2).  Physical Exam  Constitutional: She appears well-developed.  Frail appearing in NAD, sitting in w/c. She is asleep but easily awakened  HENT:  Mouth/Throat: Oropharynx is clear and moist. No oropharyngeal exudate.  Eyes: Pupils are equal, round, and reactive to light. No scleral icterus.  Neck: Neck supple. Carotid bruit is not present. No tracheal deviation present.  Cardiovascular: Normal rate, regular rhythm and intact distal pulses.  Exam reveals no gallop and no friction rub.     Murmur (1/6 SEM) heard. No LE edema b/l. no calf TTP.   Pulmonary/Chest: Effort normal and breath sounds normal. No stridor. No respiratory distress. She has no wheezes. She has no rales.  Abdominal: Soft. Bowel sounds are normal. She exhibits no distension and no mass. There is no hepatomegaly. There is no tenderness. There is no rebound and no guarding.  Lymphadenopathy:    She has no cervical adenopathy.  Neurological: She is alert.  Skin: Skin is warm and dry. No rash noted.  Psychiatric: She has a normal mood and affect. Her behavior is normal.     Labs reviewed: No visits with results within 3 Month(s) from this visit. Latest known visit with results is:  Admission on 07/18/2014, Discharged on 07/24/2014  Component Date Value Ref Range  Status  . WBC 07/19/2014 12.9* 4.0 - 10.5 K/uL Final  . RBC 07/19/2014 4.32  3.87 - 5.11 MIL/uL Final  . Hemoglobin 07/19/2014 12.5  12.0 - 15.0 g/dL Final  . HCT 07/19/2014 37.5  36.0 - 46.0 % Final  . MCV 07/19/2014 86.8  78.0 - 100.0 fL Final  . MCH 07/19/2014 28.9  26.0 - 34.0 pg Final  . MCHC 07/19/2014 33.3  30.0 - 36.0 g/dL Final  . RDW 07/19/2014 13.9  11.5 - 15.5 % Final  . Platelets 07/19/2014 232  150 - 400 K/uL Final  . Neutrophils Relative % 07/19/2014 86* 43 - 77 % Final  . Neutro Abs 07/19/2014 11.0* 1.7 - 7.7 K/uL Final  . Lymphocytes Relative 07/19/2014 8* 12 - 46 % Final  . Lymphs Abs 07/19/2014 1.1  0.7 - 4.0 K/uL Final  . Monocytes Relative 07/19/2014 6  3 - 12 % Final  . Monocytes Absolute 07/19/2014 0.7  0.1 - 1.0 K/uL Final  . Eosinophils Relative 07/19/2014 0  0 - 5 % Final  . Eosinophils Absolute 07/19/2014 0.0  0.0 - 0.7 K/uL Final  . Basophils Relative 07/19/2014 0  0 - 1 % Final  . Basophils Absolute 07/19/2014 0.0  0.0 - 0.1 K/uL Final  . Sodium 07/19/2014 141  137 - 147 mEq/L Final  . Potassium 07/19/2014 4.2  3.7 - 5.3 mEq/L Final  . Chloride 07/19/2014 105  96 - 112 mEq/L Final  . CO2 07/19/2014 24  19 -  32 mEq/L Final  . Glucose, Bld 07/19/2014 148* 70 - 99 mg/dL Final  . BUN 07/19/2014 15  6 - 23 mg/dL Final  . Creatinine, Ser 07/19/2014 0.73  0.50 - 1.10 mg/dL Final  . Calcium 07/19/2014 10.8* 8.4 - 10.5 mg/dL Final  . Total Protein 07/19/2014 7.4  6.0 - 8.3 g/dL Final  . Albumin 07/19/2014 3.7  3.5 - 5.2 g/dL Final  . AST 07/19/2014 21  0 - 37 U/L Final  . ALT 07/19/2014 12  0 - 35 U/L Final  . Alkaline Phosphatase 07/19/2014 149* 39 - 117 U/L Final  . Total Bilirubin 07/19/2014 0.3  0.3 - 1.2 mg/dL Final  . GFR calc non Af Amer 07/19/2014 82* >90 mL/min Final  . GFR calc Af Amer 07/19/2014 >90  >90 mL/min Final   Comment: (NOTE)                          The eGFR has been calculated using the CKD EPI equation.                          This calculation has not been validated in all clinical situations.                          eGFR's persistently <90 mL/min signify possible Chronic Kidney                          Disease.  . Anion gap 07/19/2014 12  5 - 15 Final  . Lactic Acid, Venous 07/19/2014 2.7* 0.5 - 2.2 mmol/L Final  . Lipase 07/19/2014 38  11 - 59 U/L Final  . Color, Urine 07/19/2014 YELLOW  YELLOW Final  . APPearance 07/19/2014 CLOUDY* CLEAR Final  . Specific Gravity, Urine 07/19/2014 1.015  1.005 - 1.030 Final  . pH 07/19/2014 6.5  5.0 -  8.0 Final  . Glucose, UA 07/19/2014 100* NEGATIVE mg/dL Final  . Hgb urine dipstick 07/19/2014 MODERATE* NEGATIVE Final  . Bilirubin Urine 07/19/2014 NEGATIVE  NEGATIVE Final  . Ketones, ur 07/19/2014 15* NEGATIVE mg/dL Final  . Protein, ur 07/19/2014 30* NEGATIVE mg/dL Final  . Urobilinogen, UA 07/19/2014 1.0  0.0 - 1.0 mg/dL Final  . Nitrite 07/19/2014 NEGATIVE  NEGATIVE Final  . Leukocytes, UA 07/19/2014 LARGE* NEGATIVE Final  . Total CK 07/19/2014 111  7 - 177 U/L Final  . Squamous Epithelial / LPF 07/19/2014 RARE  RARE Final  . WBC, UA 07/19/2014 TOO NUMEROUS TO COUNT  <3 WBC/hpf Final  . RBC / HPF 07/19/2014 11-20  <3 RBC/hpf  Final  . Bacteria, UA 07/19/2014 MANY* RARE Final  . MRSA by PCR 07/19/2014 NEGATIVE  NEGATIVE Final   Comment:                                 The GeneXpert MRSA Assay (FDA                          approved for NASAL specimens                          only), is one component of a                          comprehensive MRSA colonization                          surveillance program. It is not                          intended to diagnose MRSA                          infection nor to guide or                          monitor treatment for                          MRSA infections.  . ABO/RH(D) 07/19/2014 B NEG   Final  . Antibody Screen 07/19/2014 NEG   Final  . Sample Expiration 07/19/2014 07/22/2014   Final  . Unit Number 07/19/2014 [id number]   Final  . Blood Component Type 07/19/2014 RED CELLS,LR   Final  . Unit division 07/19/2014 00   Final  . Status of Unit 07/19/2014 ISSUED,FINAL   Final  . Transfusion Status 07/19/2014 OK TO TRANSFUSE   Final  . Crossmatch Result 07/19/2014 Compatible   Final  . Prothrombin Time 07/19/2014 15.3* 11.6 - 15.2 seconds Final  . INR 07/19/2014 1.21  0.00 - 1.49 Final  . aPTT 07/19/2014 29  24 - 37 seconds Final  . Vit D, 25-Hydroxy 07/19/2014 12* 30 - 89 ng/mL Final   Comment: (NOTE)                          This assay accurately quantifies Vitamin D, which is the sum of the  25-Hydroxy forms of Vitamin D2 and D3.  Studies have shown that the                          optimum concentration of 25-Hydroxy Vitamin D is 30 ng/mL or higher.                           Concentrations of Vitamin D between 20 and 29 ng/mL are considered to                          be insufficient and concentrations less than 20 ng/mL are considered                          to be deficient for Vitamin D.                          Performed at [facility]  . ABO/RH(D) 07/19/2014 B NEG   Final  . Specimen Description 07/19/2014 URINE,  CATHETERIZED   Final  . Special Requests 07/19/2014 NONE   Final  . Culture  Setup Time 07/19/2014    Final                   Value:07/19/2014 16:07                         Performed at [facility]  . Colony Count 07/19/2014    Final                   Value:NO GROWTH                         Performed at [facility]  . Culture 07/19/2014    Final                   Value:NO GROWTH                         Performed at [facility]  . Report Status 07/19/2014 07/20/2014 FINAL   Final  . MRSA, PCR 07/20/2014 NEGATIVE  NEGATIVE Final  . Staphylococcus aureus 07/20/2014 NEGATIVE  NEGATIVE Final   Comment:                                 The Xpert SA Assay (FDA                          approved for NASAL specimens                          in patients over 12 years of age),                          is one component of                          a comprehensive surveillance                          program.  Test performance has                          been validated by [facility] for patients greater                          than or equal to 60 year old.                          It is not intended                          to diagnose infection nor to                          guide or monitor treatment.  . WBC 07/21/2014 6.5  4.0 - 10.5 K/uL Final  . RBC 07/21/2014 3.05* 3.87 - 5.11 MIL/uL Final  . Hemoglobin 07/21/2014 9.0* 12.0 - 15.0 g/dL Final  . HCT 07/21/2014 26.7* 36.0 - 46.0 % Final  . MCV 07/21/2014 87.5  78.0 - 100.0 fL Final  . MCH 07/21/2014 29.5  26.0 - 34.0 pg Final  . MCHC 07/21/2014 33.7  30.0 - 36.0 g/dL Final  . RDW 07/21/2014 14.5  11.5 - 15.5 % Final  . Platelets 07/21/2014 180  150 - 400 K/uL Final  . Sodium 07/21/2014 140  137 - 147 mEq/L Final  . Potassium 07/21/2014 3.6* 3.7 - 5.3 mEq/L Final  . Chloride 07/21/2014 105  96 - 112 mEq/L Final  . CO2 07/21/2014 27  19 - 32 mEq/L Final  . Glucose, Bld 07/21/2014 115*  70 - 99 mg/dL Final  . BUN 07/21/2014 8  6 - 23 mg/dL Final  . Creatinine, Ser 07/21/2014 0.61  0.50 - 1.10 mg/dL Final  . Calcium 07/21/2014 9.8  8.4 - 10.5 mg/dL Final  . GFR calc non Af Amer 07/21/2014 87* >90 mL/min Final  . GFR calc Af Amer 07/21/2014 >90  >90 mL/min Final   Comment: (NOTE)                          The eGFR has been calculated using the CKD EPI equation.                          This calculation has not been validated in all clinical situations.                          eGFR's persistently <90 mL/min signify possible Chronic Kidney                          Disease.  . Anion gap 07/21/2014 8  5 - 15 Final  . Sodium 07/22/2014 137  137 - 147 mEq/L Final  . Potassium 07/22/2014 4.0  3.7 - 5.3 mEq/L Final  . Chloride 07/22/2014 103  96 - 112 mEq/L Final  . CO2 07/22/2014 25  19 - 32 mEq/L Final  . Glucose, Bld 07/22/2014 118* 70 - 99 mg/dL Final  . BUN 07/22/2014 9  6 - 23 mg/dL Final  .  Creatinine, Ser 07/22/2014 0.59  0.50 - 1.10 mg/dL Final  . Calcium 07/22/2014 9.8  8.4 - 10.5 mg/dL Final  . GFR calc non Af Amer 07/22/2014 87* >90 mL/min Final  . GFR calc Af Amer 07/22/2014 >90  >90 mL/min Final   Comment: (NOTE)                          The eGFR has been calculated using the CKD EPI equation.                          This calculation has not been validated in all clinical situations.                          eGFR's persistently <90 mL/min signify possible Chronic Kidney                          Disease.  . Anion gap 07/22/2014 9  5 - 15 Final  . WBC 07/22/2014 5.7  4.0 - 10.5 K/uL Final  . RBC 07/22/2014 2.76* 3.87 - 5.11 MIL/uL Final  . Hemoglobin 07/22/2014 7.8* 12.0 - 15.0 g/dL Final  . HCT 07/22/2014 23.7* 36.0 - 46.0 % Final  . MCV 07/22/2014 85.9  78.0 - 100.0 fL Final  . MCH 07/22/2014 28.3  26.0 - 34.0 pg Final  . MCHC 07/22/2014 32.9  30.0 - 36.0 g/dL Final  . RDW 07/22/2014 14.6  11.5 - 15.5 % Final  . Platelets 07/22/2014 184  150 - 400 K/uL Final   . Order Confirmation 07/22/2014 ORDER PROCESSED BY BLOOD BANK   Final  . Hemoglobin 07/23/2014 10.4* 12.0 - 15.0 g/dL Final   POST TRANSFUSION SPECIMEN  . HCT 07/23/2014 31.1* 36.0 - 46.0 % Final  . Sodium 07/24/2014 135* 137 - 147 mEq/L Final  . Potassium 07/24/2014 3.9  3.7 - 5.3 mEq/L Final  . Chloride 07/24/2014 99  96 - 112 mEq/L Final  . CO2 07/24/2014 26  19 - 32 mEq/L Final  . Glucose, Bld 07/24/2014 109* 70 - 99 mg/dL Final  . BUN 07/24/2014 15  6 - 23 mg/dL Final  . Creatinine, Ser 07/24/2014 0.67  0.50 - 1.10 mg/dL Final  . Calcium 07/24/2014 10.6* 8.4 - 10.5 mg/dL Final  . GFR calc non Af Amer 07/24/2014 84* >90 mL/min Final  . GFR calc Af Amer 07/24/2014 >90  >90 mL/min Final   Comment: (NOTE)                          The eGFR has been calculated using the CKD EPI equation.                          This calculation has not been validated in all clinical situations.                          eGFR's persistently <90 mL/min signify possible Chronic Kidney                          Disease.  . Anion gap 07/24/2014 10  5 - 15 Final  . WBC 07/24/2014 7.0  4.0 - 10.5 K/uL Final  . RBC 07/24/2014 3.48* 3.87 - 5.11 MIL/uL Final  .  Hemoglobin 07/24/2014 10.1* 12.0 - 15.0 g/dL Final  . HCT 07/24/2014 30.5* 36.0 - 46.0 % Final  . MCV 07/24/2014 87.6  78.0 - 100.0 fL Final  . MCH 07/24/2014 29.0  26.0 - 34.0 pg Final  . MCHC 07/24/2014 33.1  30.0 - 36.0 g/dL Final  . RDW 07/24/2014 15.0  11.5 - 15.5 % Final  . Platelets 07/24/2014 239  150 - 400 K/uL Final    No results found.   Assessment/Plan   ICD-9-CM ICD-10-CM   1. Essential hypertension 401.9 I10   2. FTT (failure to thrive) in adult 783.7 R62.7   3. Dementia, with behavioral disturbance 294.21 F03.91   4. Psychosis, unspecified psychosis type 298.9 F29   5. Hyperlipidemia 272.4 E78.5   6. Pernicious anemia 281.0 D51.0   7. Hypercalcemia 275.42 E83.52   8. Hyperparathyroidism ([place]) 252.00 E21.3   9.      Chronic  pain 10.    Constipation   Cont comfort measures  Psych following  Cont current meds as ordered  PT/OT/ST as indicated  Will follow  Deandra Gadson S. Perlie Gold  [facility] [address] [place], [place] [address] [phone] Cell (Monday-Friday 8 AM - 5 PM) [phone] After 5 PM and follow prompts

## 2016-03-11 ENCOUNTER — Non-Acute Institutional Stay (SKILLED_NURSING_FACILITY): Payer: Medicare HMO | Admitting: Adult Health

## 2016-03-11 ENCOUNTER — Encounter: Payer: Self-pay | Admitting: Adult Health

## 2016-03-11 DIAGNOSIS — F29 Unspecified psychosis not due to a substance or known physiological condition: Secondary | ICD-10-CM | POA: Diagnosis not present

## 2016-03-11 DIAGNOSIS — E213 Hyperparathyroidism, unspecified: Secondary | ICD-10-CM

## 2016-03-11 DIAGNOSIS — R627 Adult failure to thrive: Secondary | ICD-10-CM

## 2016-03-11 DIAGNOSIS — F0390 Unspecified dementia without behavioral disturbance: Secondary | ICD-10-CM | POA: Diagnosis not present

## 2016-03-11 DIAGNOSIS — I10 Essential (primary) hypertension: Secondary | ICD-10-CM

## 2016-03-11 DIAGNOSIS — F419 Anxiety disorder, unspecified: Secondary | ICD-10-CM

## 2016-03-11 NOTE — Progress Notes (Signed)
Patient ID: Pamela Blackburn, female   DOB: 1936/02/11, 80 y.o.   MRN: [id number]   Facility:  [facility]       No Known Allergies  Chief Complaint  Patient presents with  . Medical Management of Chronic Issues    Follow up    HPI:  She is a long term resident of this facility being seen for the management of her chronic illnesses. Overall there is little change in her overall status.  She is unable to fully participate in the hpi or ros. There are no nursing concerns at this time.   Past Medical History  Diagnosis Date  . Anemia   . Osteoporosis   . Coronary artery disease   . Dementia   . Hypertension   . Leukocytosis 07/26/2012  . Hyperlipidemia 07/26/2012  . Pernicious anemia 07/26/2012  . HTN (hypertension) 07/26/2012  . CAD (coronary artery disease) 07/26/2012    Cath 12/26/05: LAD 10-15% mid stenosis and distally, LCIRC small and patent, RCA 15-20% mid stenosis. EF 45%, LV with mild anterolateral and mid inferior wall hypokinesia  Myoview : 12/13/2009: No evidence of inducible reversible ischemia EF 77%  . Anxiety 07/26/2012  . History of tobacco use 07/26/2012  . Hyperparathyroidism ([facility]) 07/26/2012  . Vitamin D deficiency 07/26/2012  . Closed intertrochanteric fracture of right hip ([facility]) 07/26/2012    Past Surgical History  Procedure Laterality Date  . Tonsillectomy    . Intramedullary (im) nail intertrochanteric Left 07/20/2014    Procedure: INTRAMEDULLARY (IM) NAIL INTERTROCHANTRIC;  Surgeon: Sheral Apley, MD;  Location: [facility] OR;  Service: Orthopedics;  Laterality: Left;    VITAL SIGNS BP 118/63 mmHg  Pulse 67  Temp(Src) 97.4 F (36.3 C) (Oral)  Resp 20  Ht  (1.549 m)  Wt 83 lb 2 oz (37.705 kg)  BMI 15.71 kg/m2  SpO2 100%  Patient's Medications  New Prescriptions   No medications on file  Previous Medications   DOCUSATE SODIUM (COLACE) 100 MG CAPSULE    Take 1 capsule (100 mg total) by mouth 2 (two) times daily. Continue this while taking narcotics to help with  bowel movements   FENOFIBRATE 54 MG TABLET    Take 54 mg by mouth daily.   HYDROCODONE-ACETAMINOPHEN (NORCO) 5-325 MG PER TABLET    Take one tablet by mouth every 12 hours for pain. Not to exceed  of APAP from all sources/24h   IPRATROPIUM-ALBUTEROL (DUONEB) 0.5-2.5 (3) MG/3ML SOLN    Take 3 mLs by nebulization every 6 (six) hours as needed.   LORAZEPAM (ATIVAN) 0.5 MG TABLET    Take 1 tablet (0.5 mg total) by mouth every 12 (twelve) hours. Hold for sedation   METOPROLOL (LOPRESSOR) 50 MG TABLET    Take 50 mg by mouth daily.   MULTIPLE VITAMINS PO    Take 1 tablet by mouth daily.   POLYETHYLENE GLYCOL (MIRALAX / GLYCOLAX) PACKET    Take 17 g by mouth daily.   QUETIAPINE (SEROQUEL) 25 MG TABLET    Take 25 mg by mouth daily. Take 25 mg in the afternoon   SENNA-DOCUSATE (SENOKOT-S) 8.6-50 MG PER TABLET    Take 2 tablets by mouth at bedtime.   VITAMIN B-12 (CYANOCOBALAMIN) 1000 MCG TABLET    Take 1,000 mcg by mouth daily. For anemia  Modified Medications   No medications on file  Discontinued Medications   No medications on file     SIGNIFICANT DIAGNOSTIC EXAMS  09-17-14: chest x-ray; rotated kyphotic projection hyperinflation with  clear lungs   04-25-15: chest x-ray: no acute cardiopulmonary process  11-07-15: chest x-ray; no acute cardiopulmonary disease process   LABS REVIEWED:    04-10-15: wbc 5.0; hgb 12.0; hct 37; mcv 89; plt 237; glucose 81; bun 19.1; creat 0.76; k+4.0; na++147; ca++11.0  liver normal albumin 3.5; hgb a1c 5.5; chol 179; ldl 86; trig 304; hdl 32   06-25-15: wbc 4.4; hgb 13.0; hct 41.4; mcv 91.2; plt 272  11-01-15: wbc 5.3; hgb 12.8; hct 41.1; mcv 88.2; plt 294; glucose 94; bun 20.1; creat 0.68; k+ 3.8; na++149; liver normal albumin 4.0  12-07-15: pth 88.790     Review of Systems Unable to perform ROS: Dementia    Physical Exam Constitutional: No distress.  Frail   Eyes: Conjunctivae are normal.  Neck: Neck supple. No JVD present. No thyromegaly present.    Cardiovascular: Normal rate, regular rhythm and intact distal pulses.   Respiratory: Effort normal and breath sounds normal. No respiratory distress. She has no wheezes.  GI: Soft. Bowel sounds are normal. She exhibits no distension. There is no tenderness.  Musculoskeletal: She exhibits no edema.  Able to move all extremities   Lymphadenopathy:    She has no cervical adenopathy.  Neurological: She is alert.  Skin: Skin is warm and dry. She is not diaphoretic.  Psychiatric: She has a normal mood and affect.     ASSESSMENT/ PLAN:  1. Hypertension: will continue lopressor 50 mg daily; will monitor  2. Constipation; will continue  miralax daily; and senna s 2 tabs nightly   3. Anemia: is off medications;  her hgb is 12.8  4. Status post left hip fracture from August 2015: no change in recent status; is on long term pain medication; vicodin 5/325 mg twice daily and will monitor   5. Psychosis: does talk to herself; is unable answer questions; no recent reports of behavioral issues present; will continue ativan 0.5 mg twice daily for anxiety and will monitor her status.  Her seroquel has been stopped.   6. Dementia without behavioral disturbance: has declined in status; is presently not on medications; will not make changes will monitor  Her weight is 90 pounds and is without change.  Her disease state is end stage.  Weight loss is an expected outcome at the end stage of this disease.   7. Hypercalcemia: her calcium level is 11.9 . Her goals of care is comfort with her no hospitalization and dnr status.   8.  Hyperparathyroidism: Her PTH: is 88.790. She is not on medications; will not make changes will continue to monitor her status.   9. Hypertriglyceridemia: will continue fenofibrate 54 mg daily; her trig are 304.  10. FTT: her current weight is 83 pounds; her weight in June 2016  was 106 pounds. Will continue supplements per facility protocol; remeron was not effective in her gaining  weight.              Synthia Innocenteborah Caelum Federici NP [facility]  Contact [phone] Monday through Friday 8am- 5pm  After hours call [phone]

## 2016-03-18 DIAGNOSIS — R627 Adult failure to thrive: Secondary | ICD-10-CM | POA: Insufficient documentation

## 2016-03-27 DIAGNOSIS — M79671 Pain in right foot: Secondary | ICD-10-CM | POA: Diagnosis not present

## 2016-03-27 DIAGNOSIS — B351 Tinea unguium: Secondary | ICD-10-CM | POA: Diagnosis not present

## 2016-03-27 DIAGNOSIS — M79672 Pain in left foot: Secondary | ICD-10-CM | POA: Diagnosis not present

## 2016-03-27 DIAGNOSIS — I2510 Atherosclerotic heart disease of native coronary artery without angina pectoris: Secondary | ICD-10-CM | POA: Diagnosis not present

## 2016-04-15 ENCOUNTER — Encounter: Payer: Self-pay | Admitting: Adult Health

## 2016-04-15 ENCOUNTER — Non-Acute Institutional Stay (SKILLED_NURSING_FACILITY): Payer: Medicare HMO | Admitting: Adult Health

## 2016-04-15 DIAGNOSIS — F29 Unspecified psychosis not due to a substance or known physiological condition: Secondary | ICD-10-CM

## 2016-04-15 DIAGNOSIS — F0390 Unspecified dementia without behavioral disturbance: Secondary | ICD-10-CM

## 2016-04-15 DIAGNOSIS — E213 Hyperparathyroidism, unspecified: Secondary | ICD-10-CM | POA: Diagnosis not present

## 2016-04-15 DIAGNOSIS — D510 Vitamin B12 deficiency anemia due to intrinsic factor deficiency: Secondary | ICD-10-CM

## 2016-04-15 DIAGNOSIS — E781 Pure hyperglyceridemia: Secondary | ICD-10-CM | POA: Diagnosis not present

## 2016-04-15 DIAGNOSIS — S72142S Displaced intertrochanteric fracture of left femur, sequela: Secondary | ICD-10-CM | POA: Diagnosis not present

## 2016-04-15 DIAGNOSIS — R627 Adult failure to thrive: Secondary | ICD-10-CM | POA: Diagnosis not present

## 2016-04-15 DIAGNOSIS — R69 Illness, unspecified: Secondary | ICD-10-CM | POA: Diagnosis not present

## 2016-04-15 NOTE — Progress Notes (Signed)
Patient ID: Pamela BashLinda B Derrington, female   DOB: Apr 25, 1936, 80 y.o.   MRN: [id number]   Location:  [facility] Room Number: 212-A Place of Service:  SNF (31)   CODE STATUS: DNR  No Known Allergies  Chief Complaint  Patient presents with  . Medical Management of Chronic Issues    Follow up    HPI:  She is a long term resident of this facility being seen for the management of her chronic illnesses. Overall there is little change in her status. Her weight is stable at 88 pounds. She is unable to participate in the hpi or ros. There are no nursing concerns at this time.   Past Medical History  Diagnosis Date  . Anemia   . Osteoporosis   . Coronary artery disease   . Dementia   . Hypertension   . Leukocytosis 07/26/2012  . Hyperlipidemia 07/26/2012  . Pernicious anemia 07/26/2012  . HTN (hypertension) 07/26/2012  . CAD (coronary artery disease) 07/26/2012    Cath 12/26/05: LAD 10-15% mid stenosis and distally, LCIRC small and patent, RCA 15-20% mid stenosis. EF 45%, LV with mild anterolateral and mid inferior wall hypokinesia  Myoview : 12/13/2009: No evidence of inducible reversible ischemia EF 77%  . Anxiety 07/26/2012  . History of tobacco use 07/26/2012  . Hyperparathyroidism (HCC) 07/26/2012  . Vitamin D deficiency 07/26/2012  . Closed intertrochanteric fracture of right hip (HCC) 07/26/2012    Past Surgical History  Procedure Laterality Date  . Tonsillectomy    . Intramedullary (im) nail intertrochanteric Left 07/20/2014    Procedure: INTRAMEDULLARY (IM) NAIL INTERTROCHANTRIC;  Surgeon: Sheral Apleyimothy D Murphy, MD;  Location: [facility] OR;  Service: Orthopedics;  Laterality: Left;    Social History   Social History  . Marital Status: Legally Separated    Spouse Name: N/A  . Number of Children: N/A  . Years of Education: N/A   Occupational History  . Not on file.   Social History Main Topics  . Smoking status: Former Smoker -- 0.50 packs/day    Types: Cigarettes  .  Smokeless tobacco: Not on file  . Alcohol Use: No  . Drug Use: No  . Sexual Activity: Not on file   Other Topics Concern  . Not on file   Social History Narrative   Family History  Problem Relation Age of Onset  . Breast cancer Mother   . Lung cancer Father       VITAL SIGNS BP 144/61 mmHg  Pulse 64  Temp(Src) 97.9 F (36.6 C) (Oral)  Resp 20  Ht 5\' 1"  (1.549 m)  Wt 88 lb 6 oz (40.087 kg)  BMI 16.71 kg/m2  SpO2 96%  Patient's Medications  New Prescriptions   No medications on file  Previous Medications   DOCUSATE SODIUM (COLACE) 100 MG CAPSULE    Take 1 capsule (100 mg total) by mouth 2 (two) times daily. Continue this while taking narcotics to help with bowel movements   FENOFIBRATE 54 MG TABLET    Take 54 mg by mouth daily.   HYDROCODONE-ACETAMINOPHEN (NORCO) 5-325 MG PER TABLET    Take one tablet by mouth every 12 hours for pain. Not to exceed 3000mg  of APAP from all sources/24h   IPRATROPIUM-ALBUTEROL (DUONEB) 0.5-2.5 (3) MG/3ML SOLN    Take 3 mLs by nebulization every 6 (six) hours as needed.   LORAZEPAM (ATIVAN) 0.5 MG TABLET    Take 1 tablet (0.5 mg total) by mouth every 12 (twelve) hours.  Hold for sedation   METOPROLOL (LOPRESSOR) 50 MG TABLET    Take 50 mg by mouth daily.   MULTIPLE VITAMINS PO    Take 1 tablet by mouth daily.   POLYETHYLENE GLYCOL (MIRALAX / GLYCOLAX) PACKET    Take 17 g by mouth daily.   QUETIAPINE (SEROQUEL) 25 MG TABLET    Take 25 mg by mouth daily. Take 25 mg in the afternoon   SENNA-DOCUSATE (SENOKOT-S) 8.6-50 MG PER TABLET    Take 2 tablets by mouth at bedtime.   VITAMIN B-12 (CYANOCOBALAMIN) 1000 MCG TABLET    Take 1,000 mcg by mouth daily. For anemia  Modified Medications   No medications on file  Discontinued Medications   No medications on file     SIGNIFICANT DIAGNOSTIC EXAMS  09-17-14: chest x-ray; rotated kyphotic projection hyperinflation with clear lungs   04-25-15: chest x-ray: no acute cardiopulmonary  process  11-07-15: chest x-ray; no acute cardiopulmonary disease process   LABS REVIEWED:    04-10-15: wbc 5.0; hgb 12.0; hct 37; mcv 89; plt 237; glucose 81; bun 19.1; creat 0.76; k+4.0; na++147; ca++11.0  liver normal albumin 3.5; hgb a1c 5.5; chol 179; ldl 86; trig 304; hdl 32   06-25-15: wbc 4.4; hgb 13.0; hct 41.4; mcv 91.2; plt 272  11-01-15: wbc 5.3; hgb 12.8; hct 41.1; mcv 88.2; plt 294; glucose 94; bun 20.1; creat 0.68; k+ 3.8; na++149; liver normal albumin 4.0  12-07-15: pth 88.790     Review of Systems Unable to perform ROS: Dementia    Physical Exam Constitutional: No distress.  Frail   Eyes: Conjunctivae are normal.  Neck: Neck supple. No JVD present. No thyromegaly present.  Cardiovascular: Normal rate, regular rhythm and intact distal pulses.   Respiratory: Effort normal and breath sounds normal. No respiratory distress. She has no wheezes.  GI: Soft. Bowel sounds are normal. She exhibits no distension. There is no tenderness.  Musculoskeletal: She exhibits no edema.  Able to move all extremities   Lymphadenopathy:    She has no cervical adenopathy.  Neurological: She is alert.  Skin: Skin is warm and dry. She is not diaphoretic.  Psychiatric: She has a normal mood and affect.     ASSESSMENT/ PLAN:  1. Hypertension: will continue lopressor 50 mg daily; will monitor  2. Constipation; will continue  miralax daily; and senna s 2 tabs nightly   3. Anemia: is off medications;  her hgb is 12.8  4. Status post left hip fracture from August 2015: no change in recent status; is on long term pain medication; vicodin 5/325 mg twice daily and will monitor   5. Psychosis: does talk to herself; is unable answer questions; no recent reports of behavioral issues present; will continue ativan 0.5 mg twice daily for anxiety and will monitor her status.  Her seroquel has been stopped.   6. Dementia without behavioral disturbance: has declined in status; is presently not on  medications; will not make changes will monitor  Her weight is 88 pounds and is without change.  Her disease state is end stage.  Weight loss is an expected outcome at the end stage of this disease.   7. Hypercalcemia: her calcium level is 11.9 . Her goals of care is comfort with her no hospitalization and dnr status.   8.  Hyperparathyroidism: Her PTH: is 88.790. She is not on medications; will not make changes will continue to monitor her status.   9. Hypertriglyceridemia: will continue fenofibrate 54 mg daily; her trig  are 304.  10. FTT: her current weight is 88 pounds; her weight in June 2016  was 106 pounds. Will continue supplements per facility protocol; remeron was not effective in her gaining weight.         Synthia Innocent NP [facility]  Contact [phone] Monday through Friday 8am- 5pm  After hours call [phone]

## 2016-05-22 ENCOUNTER — Other Ambulatory Visit: Payer: Self-pay | Admitting: *Deleted

## 2016-05-22 MED ORDER — HYDROCODONE-ACETAMINOPHEN 5-325 MG PO TABS: ORAL_TABLET | ORAL | Status: DC

## 2016-05-22 NOTE — Telephone Encounter (Signed)
[facility] 

## 2016-06-19 ENCOUNTER — Encounter: Payer: Self-pay | Admitting: Internal Medicine

## 2016-06-19 ENCOUNTER — Non-Acute Institutional Stay (SKILLED_NURSING_FACILITY): Payer: Medicare HMO | Admitting: Internal Medicine

## 2016-06-19 DIAGNOSIS — R627 Adult failure to thrive: Secondary | ICD-10-CM | POA: Diagnosis not present

## 2016-06-19 DIAGNOSIS — E213 Hyperparathyroidism, unspecified: Secondary | ICD-10-CM

## 2016-06-19 NOTE — Progress Notes (Signed)
MRN: [id number] Name: Pamela Blackburn  Sex: female Age: 80 y.o. DOB: 23-Apr-1936  PSC #:  Facility/Room: [facility] / 212 A Level Of Care: SNF Provider: Randon Goldsmith. Lyn Hollingshead, MD Emergency Contacts: Extended Emergency Contact Information Primary Emergency Contact: Mincer,Lisa Address: [address]          [place], [place] [address] [place] Home Phone: [phone] Relation: Daughter Secondary Emergency Contact: Jones,Hunter  [place] Home Phone: [phone] Relation: Relative  Code Status: DNR  Allergies: Review of patient's allergies indicates no known allergies.  Chief Complaint  Patient presents with  . Medical Management of Chronic Issues    Routine Visit    HPI: Patient is 80 y.o. female with end stage dementia who is being seen for routine issues of hypercalcemia, hyperparathyroidism and FTT.  Past Medical History:  Diagnosis Date  . Anemia   . Anxiety 07/26/2012  . CAD (coronary artery disease) 07/26/2012   Cath 12/26/05: LAD 10-15% mid stenosis and distally, LCIRC small and patent, RCA 15-20% mid stenosis. EF 45%, LV with mild anterolateral and mid inferior wall hypokinesia  Myoview : 12/13/2009: No evidence of inducible reversible ischemia EF 77%  . Closed intertrochanteric fracture of right hip (HCC) 07/26/2012  . Coronary artery disease   . Dementia   . History of tobacco use 07/26/2012  . HTN (hypertension) 07/26/2012  . Hyperlipidemia 07/26/2012  . Hyperparathyroidism (HCC) 07/26/2012  . Hypertension   . Leukocytosis 07/26/2012  . Osteoporosis   . Pernicious anemia 07/26/2012  . Vitamin D deficiency 07/26/2012    Past Surgical History:  Procedure Laterality Date  . INTRAMEDULLARY (IM) NAIL INTERTROCHANTERIC Left 07/20/2014   Procedure: INTRAMEDULLARY (IM) NAIL INTERTROCHANTRIC;  Surgeon: Sheral Apley, MD;  Location: [facility] OR;  Service: Orthopedics;  Laterality: Left;  . TONSILLECTOMY        Medication List       Accurate as of 06/19/16 11:26  AM. Always use your most recent med list.          DAILY VITE Tabs Take 1 tablet by mouth daily.   docusate sodium 100 MG capsule Commonly known as:  COLACE Take 1 capsule (100 mg total) by mouth 2 (two) times daily. Continue this while taking narcotics to help with bowel movements   fenofibrate 54 MG tablet Take 54 mg by mouth every evening.   HYDROcodone-acetaminophen 5-325 MG tablet Commonly known as:  NORCO Take one tablet by mouth twice daily for pain. Not to exceed 3000mg  of APAP from all sources/24h   ipratropium-albuterol 0.5-2.5 (3) MG/3ML Soln Commonly known as:  DUONEB Take 3 mLs by nebulization every 6 (six) hours as needed.   LORazepam 0.5 MG tablet Commonly known as:  ATIVAN Take 0.5 mg by mouth 2 (two) times daily.   metoprolol 50 MG tablet Commonly known as:  LOPRESSOR Take 50 mg by mouth daily. Notify MD/NP if BP consistently > 150/90 x 3 readings or < 100/60 or HR > 100 or < 50   NUTRITIONAL SUPPLEMENT PO four times a day for supplement 2 cal 120 cc three times a day   NUTRITIONAL SUPPLEMENT PO Give House Supplement nutritional treat 4 oz three times daily by mouth with meals   polyethylene glycol packet Commonly known as:  MIRALAX / GLYCOLAX Take 17 g by mouth daily.   senna-docusate 8.6-50 MG tablet Commonly known as:  Senokot-S Take 2 tablets by mouth at bedtime.   vitamin B-12 1000 MCG tablet Commonly known as:  CYANOCOBALAMIN Take  1,000 mcg by mouth daily. For anemia       Meds ordered this encounter  Medications  . Nutritional Supplements (NUTRITIONAL SUPPLEMENT PO)    Sig: four times a day for supplement 2 cal 120 cc three times a day  . Nutritional Supplements (NUTRITIONAL SUPPLEMENT PO)    Sig: Give House Supplement nutritional treat 4 oz three times daily by mouth with meals  . LORazepam (ATIVAN) 0.5 MG tablet    Sig: Take 0.5 mg by mouth 2 (two) times daily.  . Multiple Vitamin (DAILY VITE) TABS    Sig: Take 1 tablet by mouth  daily.    Immunization History  Administered Date(s) Administered  . Influenza-Unspecified 08/09/2015  . Pneumococcal Polysaccharide-23 08/02/2012  . Pneumococcal-Unspecified 01/22/2015    Social History  Substance Use Topics  . Smoking status: Former Smoker    Packs/day: 0.50    Types: Cigarettes  . Smokeless tobacco: Not on file  . Alcohol use No    Review of Systems  UTO 2/2 dementia    Vitals:   06/19/16 1110  BP: (!) 148/85  Pulse: 79  Resp: 18  Temp: 98 F (36.7 C)    Physical Exam  GENERAL APPEARANCE: Alert, No acute distress  SKIN: No diaphoresis rash HEENT: Unremarkable RESPIRATORY: Breathing is even, unlabored. Lung sounds are clear   CARDIOVASCULAR: Heart RRR no murmurs, rubs or gallops. No peripheral edema  GASTROINTESTINAL: Abdomen is soft, non-tender, not distended w/ normal bowel sounds.  GENITOURINARY: Bladder non tender, not distended  MUSCULOSKELETAL: No abnormal joints or musculature NEUROLOGIC: Cranial nerves 2-12 grossly intact. Moves all extremities PSYCHIATRIC:pt speaks but makes no sense, can't answer questions , no behavioral issues  Patient Active Problem List   Diagnosis Date Noted  . Failure to thrive in adult 03/18/2016  . Aspiration of food 11/26/2015  . Hypertriglyceridemia 10/31/2015  . Psychosis 10/25/2014  . Constipation 10/25/2014  . Intertrochanteric fracture of left femur (HCC) 07/19/2014  . Hypercalcemia 07/26/2012  . Hyperlipidemia 07/26/2012  . Pernicious anemia 07/26/2012  . Dementia without behavioral disturbance 07/26/2012  . Osteoporosis 07/26/2012  . HTN (hypertension) 07/26/2012  . CAD (coronary artery disease) 07/26/2012  . Anxiety 07/26/2012  . Hyperparathyroidism (HCC) 07/26/2012  . Vitamin D deficiency 07/26/2012    CBC    Component Value Date/Time   WBC 5.3 11/01/2015   WBC 7.0 07/24/2014 0431   RBC 3.48 (L) 07/24/2014 0431   HGB 12.8 11/01/2015   HCT 41 11/01/2015   PLT 294 11/01/2015   MCV  87.6 07/24/2014 0431   LYMPHSABS 1.1 07/19/2014 0108   MONOABS 0.7 07/19/2014 0108   EOSABS 0.0 07/19/2014 0108   BASOSABS 0.0 07/19/2014 0108    CMP     Component Value Date/Time   NA 149 (A) 11/01/2015   K 3.8 11/01/2015   CL 99 07/24/2014 0431   CO2 26 07/24/2014 0431   GLUCOSE 109 (H) 07/24/2014 0431   BUN 20 11/01/2015   CREATININE 0.7 11/01/2015   CREATININE 0.67 07/24/2014 0431   CALCIUM 10.6 (H) 07/24/2014 0431   CALCIUM 11.0 (H) 07/26/2012 1503   PROT 7.4 07/19/2014 0108   ALBUMIN 3.7 07/19/2014 0108   AST 24 11/01/2015   ALT 19 11/01/2015   ALKPHOS 149 (H) 07/19/2014 0108   BILITOT 0.3 07/19/2014 0108   GFRNONAA 84 (L) 07/24/2014 0431   GFRAA >90 07/24/2014 0431    Assessment and Plan  Hypercalcemia: her calcium level is 11.9 . Her goals of care is comfort with her  no hospitalization and DNR status.    Hyperparathyroidism: Her PTH: is 88.790. She is not on medications; will not make changes will continue to monitor her status.    FTT: her current weight is 88 pounds; her weight in June 2016  was 106 pounds. Will continue supplements per facility protocol; remeron was not effective in her gaining weight    Thurston Hole D. Lyn Hollingshead, MD

## 2016-06-27 DIAGNOSIS — I2510 Atherosclerotic heart disease of native coronary artery without angina pectoris: Secondary | ICD-10-CM | POA: Diagnosis not present

## 2016-06-27 DIAGNOSIS — M79672 Pain in left foot: Secondary | ICD-10-CM | POA: Diagnosis not present

## 2016-06-27 DIAGNOSIS — B351 Tinea unguium: Secondary | ICD-10-CM | POA: Diagnosis not present

## 2016-06-27 DIAGNOSIS — M79671 Pain in right foot: Secondary | ICD-10-CM | POA: Diagnosis not present

## 2016-07-02 DIAGNOSIS — I2510 Atherosclerotic heart disease of native coronary artery without angina pectoris: Secondary | ICD-10-CM | POA: Diagnosis not present

## 2016-07-02 DIAGNOSIS — I10 Essential (primary) hypertension: Secondary | ICD-10-CM | POA: Diagnosis not present

## 2016-07-02 DIAGNOSIS — D510 Vitamin B12 deficiency anemia due to intrinsic factor deficiency: Secondary | ICD-10-CM | POA: Diagnosis not present

## 2016-07-25 ENCOUNTER — Encounter: Payer: Self-pay | Admitting: Adult Health

## 2016-07-25 ENCOUNTER — Non-Acute Institutional Stay (SKILLED_NURSING_FACILITY): Payer: Medicare HMO | Admitting: Adult Health

## 2016-07-25 DIAGNOSIS — S72142S Displaced intertrochanteric fracture of left femur, sequela: Secondary | ICD-10-CM | POA: Diagnosis not present

## 2016-07-25 DIAGNOSIS — E785 Hyperlipidemia, unspecified: Secondary | ICD-10-CM

## 2016-07-25 DIAGNOSIS — E781 Pure hyperglyceridemia: Secondary | ICD-10-CM | POA: Diagnosis not present

## 2016-07-25 DIAGNOSIS — R627 Adult failure to thrive: Secondary | ICD-10-CM

## 2016-07-25 DIAGNOSIS — I10 Essential (primary) hypertension: Secondary | ICD-10-CM

## 2016-07-25 DIAGNOSIS — E213 Hyperparathyroidism, unspecified: Secondary | ICD-10-CM | POA: Diagnosis not present

## 2016-07-25 NOTE — Progress Notes (Signed)
Patient ID: Pamela BashLinda B Blackburn, female   DOB: 1936-08-13, 80 y.o.   MRN: [id number]   Location:    [facility] Room Number: 212-A Place of Service:  SNF (31)   CODE STATUS: DNR  No Known Allergies  Chief Complaint  Patient presents with  . Medical Management of Chronic Issues     HPI:  She is a long term resident of this facility being seen for the management of her chronic illnesses. She is chewing on her right index finger. Her teeth are rotted off at the gum line. I have spoken with the nursing supervisor and will have placed on the dental list. She is unable to participate in the hpi or ros.   Past Medical History:  Diagnosis Date  . Anemia   . Anxiety 07/26/2012  . CAD (coronary artery disease) 07/26/2012   Cath 12/26/05: LAD 10-15% mid stenosis and distally, LCIRC small and patent, RCA 15-20% mid stenosis. EF 45%, LV with mild anterolateral and mid inferior wall hypokinesia  Myoview : 12/13/2009: No evidence of inducible reversible ischemia EF 77%  . Closed intertrochanteric fracture of right hip (HCC) 07/26/2012  . Coronary artery disease   . Dementia   . History of tobacco use 07/26/2012  . HTN (hypertension) 07/26/2012  . Hyperlipidemia 07/26/2012  . Hyperparathyroidism (HCC) 07/26/2012  . Hypertension   . Leukocytosis 07/26/2012  . Osteoporosis   . Pernicious anemia 07/26/2012  . Vitamin D deficiency 07/26/2012    Past Surgical History:  Procedure Laterality Date  . INTRAMEDULLARY (IM) NAIL INTERTROCHANTERIC Left 07/20/2014   Procedure: INTRAMEDULLARY (IM) NAIL INTERTROCHANTRIC;  Surgeon: Sheral Apleyimothy D Murphy, MD;  Location: [facility] OR;  Service: Orthopedics;  Laterality: Left;  . TONSILLECTOMY      Social History   Social History  . Marital status: Legally Separated    Spouse name: N/A  . Number of children: N/A  . Years of education: N/A   Occupational History  . Not on file.   Social History Main Topics  . Smoking status: Former Smoker    Packs/day: 0.50    Types:  Cigarettes  . Smokeless tobacco: Not on file  . Alcohol use No  . Drug use: No  . Sexual activity: Not on file   Other Topics Concern  . Not on file   Social History Narrative  . No narrative on file   Family History  Problem Relation Age of Onset  . Breast cancer Mother   . Lung cancer Father       VITAL SIGNS BP 120/66   Pulse 72   Temp 98 F (36.7 C) (Oral)   Resp 18   Ht 5\' 1"  (1.549 m)   Wt 85 lb 6 oz (38.7 kg)   SpO2 98%   BMI 16.13 kg/m   Patient's Medications  New Prescriptions   No medications on file  Previous Medications   DOCUSATE SODIUM (COLACE) 100 MG CAPSULE    Take 1 capsule (100 mg total) by mouth 2 (two) times daily. Continue this while taking narcotics to help with bowel movements   FENOFIBRATE 54 MG TABLET    Take 54 mg by mouth every evening.    HYDROCODONE-ACETAMINOPHEN (NORCO) 5-325 MG TABLET    Take one tablet by mouth twice daily for pain. Not to exceed 3000mg  of APAP from all sources/24h   IPRATROPIUM-ALBUTEROL (DUONEB) 0.5-2.5 (3) MG/3ML SOLN    Take 3 mLs by nebulization every 6 (six) hours as needed.    LORAZEPAM (ATIVAN)  0.5 MG TABLET    Take 0.5 mg by mouth 2 (two) times daily.   METOPROLOL (LOPRESSOR) 50 MG TABLET    Take 50 mg by mouth daily. Notify MD/NP if BP consistently > 150/90 x 3 readings or < 100/60 or HR > 100 or < 50   MULTIPLE VITAMIN (DAILY VITE) TABS    Take 1 tablet by mouth daily.   NUTRITIONAL SUPPLEMENTS (NUTRITIONAL SUPPLEMENT PO)    four times a day for supplement 2 cal 120 cc three times a day   NUTRITIONAL SUPPLEMENTS (NUTRITIONAL SUPPLEMENT PO)    Give House Supplement nutritional treat 4 oz three times daily by mouth with meals   POLYETHYLENE GLYCOL (MIRALAX / GLYCOLAX) PACKET    Take 17 g by mouth daily.   SENNA-DOCUSATE (SENOKOT-S) 8.6-50 MG PER TABLET    Take 2 tablets by mouth at bedtime.   VITAMIN B-12 (CYANOCOBALAMIN) 1000 MCG TABLET    Take 1,000 mcg by mouth daily. For anemia  Modified Medications   No  medications on file  Discontinued Medications   No medications on file     SIGNIFICANT DIAGNOSTIC EXAMS  09-17-14: chest x-ray; rotated kyphotic projection hyperinflation with clear lungs   04-25-15: chest x-ray: no acute cardiopulmonary process  11-07-15: chest x-ray; no acute cardiopulmonary disease process   LABS REVIEWED:    04-10-15: wbc 5.0; hgb 12.0; hct 37; mcv 89; plt 237; glucose 81; bun 19.1; creat 0.76; k+4.0; na++147; ca++11.0  liver normal albumin 3.5; hgb a1c 5.5; chol 179; ldl 86; trig 304; hdl 32   06-25-15: wbc 4.4; hgb 13.0; hct 41.4; mcv 91.2; plt 272  11-01-15: wbc 5.3; hgb 12.8; hct 41.1; mcv 88.2; plt 294; glucose 94; bun 20.1; creat 0.68; k+ 3.8; na++149; liver normal albumin 4.0  12-07-15: pth 88.790     Review of Systems Unable to perform ROS: Dementia    Physical Exam Constitutional: No distress.  Frail Teeth are rotted to the gum line    Eyes: Conjunctivae are normal.  Neck: Neck supple. No JVD present. No thyromegaly present.  Cardiovascular: Normal rate, regular rhythm and intact distal pulses.   Respiratory: Effort normal and breath sounds normal. No respiratory distress. She has no wheezes.  GI: Soft. Bowel sounds are normal. She exhibits no distension. There is no tenderness.  Musculoskeletal: She exhibits no edema.  Able to move all extremities   Lymphadenopathy:    She has no cervical adenopathy.  Neurological: She is alert.  Skin: Skin is warm and dry. She is not diaphoretic.  Psychiatric: She has a normal mood and affect.     ASSESSMENT/ PLAN:   1. Hypertension: will continue lopressor 50 mg daily; will monitor  2. Constipation; will continue  miralax daily; and senna s 2 tabs nightly   3. Anemia: is off medications;  her hgb is 12.8  4. Status post left hip fracture from August 2015: no change in recent status; is on long term pain medication; vicodin 5/325 mg twice daily and will monitor   5. Psychosis: does talk to herself;  is unable answer questions; no recent reports of behavioral issues present; will continue ativan 0.5 mg twice daily for anxiety and will monitor her status.  Her seroquel has been stopped.   6. Dementia without behavioral disturbance: has declined in status; is presently not on medications; will not make changes will monitor  Her weight is 85 pounds and is without change.  Her disease state is end stage.  Weight loss is  an expected outcome at the end stage of this disease.   7. Hypercalcemia: her calcium level is 11.9 . Her goals of care is comfort with her no hospitalization and dnr status.   8.  Hyperparathyroidism: Her PTH: is 88.790. She is not on medications; will not make changes will continue to monitor her status.   9. Hypertriglyceridemia: will continue fenofibrate 54 mg daily; her trig are 304.  10. FTT: her current weight is 85 pounds; her weight in June 2016  was 106 pounds. Will continue supplements per facility protocol; remeron was not effective in her gaining weight.   11. Rotted teeth: is chewing on her right index finger: will have her placed on the dental list.      Will check cbc; cmp; PTH; lipids; hgb a1c     MD is aware of resident's narcotic use and is in agreement with current plan of care. We will attempt to wean resident as apropriate   Synthia Innocent NP [facility]  Contact [phone] Monday through Friday 8am- 5pm  After hours call [phone]

## 2016-07-26 DIAGNOSIS — I10 Essential (primary) hypertension: Secondary | ICD-10-CM | POA: Diagnosis not present

## 2016-07-26 DIAGNOSIS — N2581 Secondary hyperparathyroidism of renal origin: Secondary | ICD-10-CM | POA: Diagnosis not present

## 2016-07-26 DIAGNOSIS — I2510 Atherosclerotic heart disease of native coronary artery without angina pectoris: Secondary | ICD-10-CM | POA: Diagnosis not present

## 2016-07-26 LAB — HEPATIC FUNCTION PANEL
ALK PHOS: 113 U/L (ref 25–125)
ALT: 10 U/L (ref 7–35)
AST: 16 U/L (ref 13–35)
Bilirubin, Total: 0.2 mg/dL

## 2016-07-26 LAB — CBC AND DIFFERENTIAL
HCT: 35 % — AB (ref 36–46)
HEMOGLOBIN: 11.1 g/dL — AB (ref 12.0–16.0)
Platelets: 334 10*3/uL (ref 150–399)
WBC: 6.2 10^3/mL

## 2016-07-26 LAB — LIPID PANEL
CHOLESTEROL: 190 mg/dL (ref 0–200)
HDL: 63 mg/dL (ref 35–70)
LDL CALC: 84 mg/dL
Triglycerides: 215 mg/dL — AB (ref 40–160)

## 2016-07-26 LAB — BASIC METABOLIC PANEL
BUN: 15 mg/dL (ref 4–21)
CREATININE: 0.7 mg/dL (ref 0.5–1.1)
Glucose: 94 mg/dL
Potassium: 4.2 mmol/L (ref 3.4–5.3)
Sodium: 140 mmol/L (ref 137–147)

## 2016-07-26 LAB — HEMOGLOBIN A1C: HEMOGLOBIN A1C: 5.4

## 2016-07-27 ENCOUNTER — Encounter: Payer: Self-pay | Admitting: Internal Medicine

## 2016-08-15 ENCOUNTER — Encounter: Payer: Self-pay | Admitting: Adult Health

## 2016-09-03 ENCOUNTER — Non-Acute Institutional Stay (SKILLED_NURSING_FACILITY): Payer: Medicare HMO | Admitting: Adult Health

## 2016-09-03 ENCOUNTER — Encounter: Payer: Self-pay | Admitting: Adult Health

## 2016-09-03 DIAGNOSIS — E213 Hyperparathyroidism, unspecified: Secondary | ICD-10-CM

## 2016-09-03 DIAGNOSIS — I10 Essential (primary) hypertension: Secondary | ICD-10-CM

## 2016-09-03 DIAGNOSIS — R627 Adult failure to thrive: Secondary | ICD-10-CM

## 2016-09-03 DIAGNOSIS — E781 Pure hyperglyceridemia: Secondary | ICD-10-CM

## 2016-09-03 DIAGNOSIS — S72145S Nondisplaced intertrochanteric fracture of left femur, sequela: Secondary | ICD-10-CM

## 2016-09-03 LAB — PARATHYROID HORMONE, INTACT (NO CA): PTH: 137.3 pg/mL

## 2016-09-03 NOTE — Progress Notes (Signed)
Patient ID: Pamela Blackburn, female   DOB: [birth year], 80 y.o.   MRN: [id number]    Location:   [facility] Room Number: 212-A Place of Service:  SNF (31)   CODE STATUS: DNR  No Known Allergies  Chief Complaint  Patient presents with  . Medical Management of Chronic Issues    Follow up    HPI:  She is a long term resident of this facility being seen for the management of her chronic illnesses. Her dental consult is pending. She is unable to participate in the hpi or ros. There are no nursing concerns at this time.    Past Medical History:  Diagnosis Date  . Anemia   . Anxiety 07/26/2012  . CAD (coronary artery disease) 07/26/2012   Cath 12/26/05: LAD 10-15% mid stenosis and distally, LCIRC small and patent, RCA 15-20% mid stenosis. EF 45%, LV with mild anterolateral and mid inferior wall hypokinesia  Myoview : 12/13/2009: No evidence of inducible reversible ischemia EF 77%  . Closed intertrochanteric fracture of right hip (HCC) 07/26/2012  . Coronary artery disease   . Dementia   . Dementia without behavioral disturbance 07/26/2012  . History of tobacco use 07/26/2012  . HTN (hypertension) 07/26/2012  . Hyperlipidemia 07/26/2012  . Hyperparathyroidism (HCC) 07/26/2012  . Hypertension   . Leukocytosis 07/26/2012  . Osteoporosis   . Pernicious anemia 07/26/2012  . Vitamin D deficiency 07/26/2012    Past Surgical History:  Procedure Laterality Date  . INTRAMEDULLARY (IM) NAIL INTERTROCHANTERIC Left 07/20/2014   Procedure: INTRAMEDULLARY (IM) NAIL INTERTROCHANTRIC;  Surgeon: Sheral Apley, MD;  Location: [facility] OR;  Service: Orthopedics;  Laterality: Left;  . TONSILLECTOMY      Social History   Social History  . Marital status: Legally Separated    Spouse name: N/A  . Number of children: N/A  . Years of education: N/A   Occupational History  . Not on file.   Social History Main Topics  . Smoking status: Former Smoker    Packs/day: 0.50    Types: Cigarettes  . Smokeless  tobacco: Not on file  . Alcohol use No  . Drug use: No  . Sexual activity: Not on file   Other Topics Concern  . Not on file   Social History Narrative  . No narrative on file   Family History  Problem Relation Age of Onset  . Breast cancer Mother   . Lung cancer Father       VITAL SIGNS BP 126/64   Pulse 76   Temp 97.6 F (36.4 C) (Oral)   Resp 17   Ht 5\' 1"  (1.549 m)   Wt 89 lb 6 oz (40.5 kg)   SpO2 98%   BMI 16.89 kg/m   Patient's Medications  New Prescriptions   No medications on file  Previous Medications   DOCUSATE SODIUM (COLACE) 100 MG CAPSULE    Take 1 capsule (100 mg total) by mouth 2 (two) times daily. Continue this while taking narcotics to help with bowel movements   FENOFIBRATE 54 MG TABLET    Take 54 mg by mouth every evening.    HYDROCODONE-ACETAMINOPHEN (NORCO) 5-325 MG TABLET    Take one tablet by mouth twice daily for pain. Not to exceed 3000mg  of APAP from all sources/24h   LORAZEPAM (ATIVAN) 0.5 MG TABLET    Take 0.5 mg by mouth 2 (two) times daily.   METOPROLOL (LOPRESSOR) 50 MG TABLET    Take 50 mg by mouth  daily. Notify MD/NP if BP consistently > 150/90 x 3 readings or < 100/60 or HR > 100 or < 50   MULTIPLE VITAMIN (DAILY VITE) TABS    Take 1 tablet by mouth daily.   NUTRITIONAL SUPPLEMENTS (NUTRITIONAL SUPPLEMENT PO)    Give House Supplement nutritional treat 4 oz three times daily by mouth with meals   POLYETHYLENE GLYCOL (MIRALAX / GLYCOLAX) PACKET    Take 17 g by mouth daily.   SENNA-DOCUSATE (SENOKOT-S) 8.6-50 MG PER TABLET    Take 2 tablets by mouth at bedtime.   VITAMIN B-12 (CYANOCOBALAMIN) 1000 MCG TABLET    Take 1,000 mcg by mouth daily. For anemia  Modified Medications   No medications on file  Discontinued Medications   IPRATROPIUM-ALBUTEROL (DUONEB) 0.5-2.5 (3) MG/3ML SOLN    Take 3 mLs by nebulization every 6 (six) hours as needed.    NUTRITIONAL SUPPLEMENTS (NUTRITIONAL SUPPLEMENT PO)    four times a day for supplement 2 cal 120  cc three times a day     SIGNIFICANT DIAGNOSTIC EXAMS  09-17-14: chest x-ray; rotated kyphotic projection hyperinflation with clear lungs   04-25-15: chest x-ray: no acute cardiopulmonary process  11-07-15: chest x-ray; no acute cardiopulmonary disease process   LABS REVIEWED:    11-01-15: wbc 5.3; hgb 12.8; hct 41.1; mcv 88.2; plt 294; glucose 94; bun 20.1; creat 0.68; k+ 3.8; na++149; liver normal albumin 4.0  12-07-15: pth 88.790 07-26-16: wbc 6.2; hgb 11.1; hct 35.3; mcv 85.2; plt 334; glucose 94; bun 15.0; creat 0.65; k+ 4.2; na++ 140; liver normal albumin 4.3; hgb a1c 5.4; PTH 137.300; chol 190; ldl 84; trig 215; hdl 63      Review of Systems Unable to perform ROS: Dementia    Physical Exam Constitutional: No distress.  Frail Teeth are rotted to the gum line    Eyes: Conjunctivae are normal.  Neck: Neck supple. No JVD present. No thyromegaly present.  Cardiovascular: Normal rate, regular rhythm and intact distal pulses.   Respiratory: Effort normal and breath sounds normal. No respiratory distress. She has no wheezes.  GI: Soft. Bowel sounds are normal. She exhibits no distension. There is no tenderness.  Musculoskeletal: She exhibits no edema.  Able to move all extremities   Lymphadenopathy:    She has no cervical adenopathy.  Neurological: She is alert.  Skin: Skin is warm and dry. She is not diaphoretic.  Psychiatric: She has a normal mood and affect.     ASSESSMENT/ PLAN:   1. Hypertension: will continue lopressor 50 mg daily; will monitor  2. Constipation; will continue  miralax daily; and senna s 2 tabs nightly   3. Anemia: is off medications;  her hgb is 11.1  4. Status post left hip fracture from August 2015: no change in recent status; is on long term pain medication; vicodin 5/325 mg twice daily and will monitor   5. Psychosis: does talk to herself; is unable answer questions; no recent reports of behavioral issues present; will continue ativan 0.5 mg  twice daily for anxiety and will monitor her status.  She is no longer on seroquel .   6. Dementia without behavioral disturbance: has declined in status; is presently not on medications; will not make changes will monitor  Her weight is 89 pounds and is without change.  Her disease state is end stage.  Weight loss is an expected outcome at the end stage of this disease.   7. Hypercalcemia: her calcium level is 11.9 . Her goals  of care is comfort with her no hospitalization and dnr status.   8.  Hyperparathyroidism: Her PTH: is 137.300. She is not on medications; will not make changes will continue to monitor her status.   9. Hypertriglyceridemia: will continue fenofibrate 54 mg daily; her trig are 215  10. FTT: her current weight is 89 pounds; her weight in June 2016  was 106 pounds. Will continue supplements per facility protocol; remeron was not effective in her gaining weight.   11. Rotted teeth: is chewing on her right index finger: awaiting dental visit.       MD is aware of resident's narcotic use and is in agreement with current plan of care. We will attempt to wean resident as appropriate.     Synthia Innocent NP [facility]  Contact [phone] Monday through Friday 8am- 5pm  After hours call [phone]

## 2016-10-14 ENCOUNTER — Encounter: Payer: Self-pay | Admitting: Adult Health

## 2016-10-14 ENCOUNTER — Non-Acute Institutional Stay (SKILLED_NURSING_FACILITY): Payer: Medicare HMO | Admitting: Adult Health

## 2016-10-14 DIAGNOSIS — R627 Adult failure to thrive: Secondary | ICD-10-CM

## 2016-10-14 DIAGNOSIS — I10 Essential (primary) hypertension: Secondary | ICD-10-CM | POA: Diagnosis not present

## 2016-10-14 DIAGNOSIS — E213 Hyperparathyroidism, unspecified: Secondary | ICD-10-CM

## 2016-10-14 NOTE — Progress Notes (Signed)
Patient ID: Pamela Blackburn, female   DOB: [birth year], 80 y.o.   MRN: [id number]   Provider:  Synthia Innocenteborah Kimbley Sprague, NP Location:  [facility] Room Number: 212-A Place of Service:  SNF (31)   PCP: Kirt Boysarter, Monica, DO Patient Care Team: Kirt BoysMonica Carter, DO as PCP - General (Internal Medicine) Sharee Holstereborah S Delsin Copen, NP as Nurse Practitioner (Geriatric Medicine) [facility])  Extended Emergency Contact Information Primary Emergency Contact: [facility] Address: [address]          [place], [place] [address] [place] Home Phone: [phone] Relation: Daughter Secondary Emergency Contact: Mignon PineJones,Hunter  [place] Home Phone: [phone] Relation: Relative  Code Status: DNR Goals of Care: Advanced Directive information Advanced Directives 10/14/2016  Does Patient Have a Medical Advance Directive? No  Type of Advance Directive -  Does patient want to make changes to medical advance directive? -  Copy of Healthcare Power of Attorney in Chart? -  Pre-existing out of facility DNR order (yellow form or pink MOST form) -      No Known Allergies   Chief Complaint  Patient presents with  . Annual Exam    Annual Exam    HPI: Patient is a 80 y.o. female seen today for an annual comprehensive examination. She has slowly declined over the past year with weight loss present. She has not been hospitalized over the past year. She is unable to participate in the hpi or ros. There are no nursing concerns at this time.    Past Medical History:  Diagnosis Date  . Anemia   . Anxiety 07/26/2012  . CAD (coronary artery disease) 07/26/2012   Cath 12/26/05: LAD 10-15% mid stenosis and distally, LCIRC small and patent, RCA 15-20% mid stenosis. EF 45%, LV with mild anterolateral and mid inferior wall hypokinesia  Myoview : 12/13/2009: No evidence of inducible reversible ischemia EF 77%  . Closed intertrochanteric fracture of right hip (HCC)  07/26/2012  . Coronary artery disease   . Dementia   . Dementia without behavioral disturbance 07/26/2012  . History of tobacco use 07/26/2012  . HTN (hypertension) 07/26/2012  . Hyperlipidemia 07/26/2012  . Hyperparathyroidism (HCC) 07/26/2012  . Hypertension   . Leukocytosis 07/26/2012  . Osteoporosis   . Pernicious anemia 07/26/2012  . Vitamin D deficiency 07/26/2012   Past Surgical History:  Procedure Laterality Date  . INTRAMEDULLARY (IM) NAIL INTERTROCHANTERIC Left 07/20/2014   Procedure: INTRAMEDULLARY (IM) NAIL INTERTROCHANTRIC;  Surgeon: Sheral Apleyimothy D Murphy, MD;  Location: [facility] OR;  Service: Orthopedics;  Laterality: Left;  . TONSILLECTOMY      reports that she has quit smoking. Her smoking use included Cigarettes. She smoked 0.50 packs per day. She does not have any smokeless tobacco history on file. She reports that she does not drink alcohol or use drugs. Social History   Social History  . Marital status: Legally Separated    Spouse name: N/A  . Number of children: N/A  . Years of education: N/A   Occupational History  . Not on file.   Social History Main Topics  . Smoking status: Former Smoker    Packs/day: 0.50    Types: Cigarettes  . Smokeless tobacco: Not on file  . Alcohol use No  . Drug use: No  . Sexual activity: Not on file   Other Topics Concern  . Not on file   Social History Narrative  . No narrative on file   Family History  Problem Relation Age  of Onset  . Breast cancer Mother   . Lung cancer Father     Vitals:   10/14/16 1143  BP: 106/62  Pulse: 69  Resp: 17  Temp: 97.9 F (36.6 C)  TempSrc: Oral  SpO2: 98%  Weight: 79 lb (35.8 kg)  Height: 5\' 1"  (1.549 m)   Body mass index is 14.93 kg/m.    Medication List       Accurate as of 10/14/16 12:07 PM. Always use your most recent med list.          DAILY VITE Tabs Take 1 tablet by mouth daily.   docusate sodium 100 MG capsule Commonly known as:  COLACE Take 1 capsule (100 mg total) by  mouth 2 (two) times daily. Continue this while taking narcotics to help with bowel movements   fenofibrate 54 MG tablet Take 54 mg by mouth every evening.   HYDROcodone-acetaminophen 5-325 MG tablet Commonly known as:  NORCO Take one tablet by mouth twice daily for pain. Not to exceed 3000mg  of APAP from all sources/24h   LORazepam 0.5 MG tablet Commonly known as:  ATIVAN Take 0.5 mg by mouth 2 (two) times daily.   metoprolol 50 MG tablet Commonly known as:  LOPRESSOR Take 50 mg by mouth daily. Notify MD/NP if BP consistently > 150/90 x 3 readings or < 100/60 or HR > 100 or < 50   NUTRITIONAL SUPPLEMENT PO Give House Supplement nutritional treat 4 oz three times daily by mouth with meals   polyethylene glycol packet Commonly known as:  MIRALAX / GLYCOLAX Take 17 g by mouth daily.   senna-docusate 8.6-50 MG tablet Commonly known as:  Senokot-S Take 2 tablets by mouth at bedtime.   vitamin B-12 1000 MCG tablet Commonly known as:  CYANOCOBALAMIN Take 1,000 mcg by mouth daily. For anemia        SIGNIFICANT DIAGNOSTIC EXAMS   09-17-14: chest x-ray; rotated kyphotic projection hyperinflation with clear lungs   04-25-15: chest x-ray: no acute cardiopulmonary process  11-07-15: chest x-ray; no acute cardiopulmonary disease process   LABS REVIEWED:    11-01-15: wbc 5.3; hgb 12.8; hct 41.1; mcv 88.2; plt 294; glucose 94; bun 20.1; creat 0.68; k+ 3.8; na++149; liver normal albumin 4.0  12-07-15: pth 88.790 07-26-16: wbc 6.2; hgb 11.1; hct 35.3; mcv 85.2; plt 334; glucose 94; bun 15.0; creat 0.65; k+ 4.2; na++ 140; liver normal albumin 4.3; hgb a1c 5.4; PTH 137.300; chol 190; ldl 84; trig 215; hdl 63      Review of Systems Unable to perform ROS: Dementia    Physical Exam Constitutional: No distress.  Frail Teeth are rotted to the gum line    Eyes: Conjunctivae are normal.  Neck: Neck supple. No JVD present. No thyromegaly present.  Cardiovascular: Normal rate, regular  rhythm and intact distal pulses.   Respiratory: Effort normal and breath sounds normal. No respiratory distress. She has no wheezes.  GI: Soft. Bowel sounds are normal. She exhibits no distension. There is no tenderness.  Musculoskeletal: She exhibits no edema.  Able to move all extremities   Lymphadenopathy:    She has no cervical adenopathy.  Neurological: She is alert.  Skin: Skin is warm and dry. She is not diaphoretic.  Psychiatric: She has a normal mood and affect.     ASSESSMENT/ PLAN:   1. Hypertension: will continue lopressor 50 mg daily; will monitor  2. Constipation; will continue  miralax daily; and senna s 2 tabs nightly   3. Anemia:  is off medications;  her hgb is 11.1  4. Status post left hip fracture from August 2015: no change in recent status; is on long term pain medication; vicodin 5/325 mg twice daily and will monitor   5. Psychosis: does talk to herself; is unable answer questions; no recent reports of behavioral issues present; will continue ativan 0.5 mg twice daily for anxiety and will monitor her status.    6. Dementia without behavioral disturbance: has declined in status; is presently not on medications; will not make changes will monitor  Her current weight is 79 pounds; her weight in Jan 2017 was 93 pounds.   7. Hypercalcemia: her calcium level is 11.9 . Her goals of care is comfort with her no hospitalization and dnr status.   8.  Hyperparathyroidism: Her PTH: is 137.300. She is not on medications; will not make changes will continue to monitor her status.   9. Hypertriglyceridemia: will continue fenofibrate 54 mg daily; her trig are 215  10. FTT:Her current weight is 79 pounds; her weight in Jan 2017 was 93 pounds.  Will continue supplements per facility protocol; remeron was not effective in her gaining weight.   11. Rotted teeth: is chewing on her right index finger: awaiting dental visit.      Time spent with patient  45  minutes >50% time  spent counseling; reviewing medical record; tests; labs; and developing future plan of care      MD is aware of resident's narcotic use and is in agreement with current plan of care. We will wean dosage as appropriate for resident   Synthia Innocent NP [facility]  Contact [phone] Monday through Friday 8am- 5pm  After hours call [phone]

## 2016-11-04 DIAGNOSIS — F419 Anxiety disorder, unspecified: Secondary | ICD-10-CM | POA: Diagnosis not present

## 2016-11-04 DIAGNOSIS — R69 Illness, unspecified: Secondary | ICD-10-CM | POA: Diagnosis not present

## 2016-11-08 IMAGING — CR DG HIP (WITH OR WITHOUT PELVIS) 2-3V*L*
3 series · 3 of 3 positions shown · non-contrast
Comparison: 07/20/2014 left femur x-ray.

CLINICAL DATA: Fall with hip pain.  Initial encounter

EXAM:
LEFT HIP - COMPLETE 2+ VIEW

[x pelvis (1 of 2)]
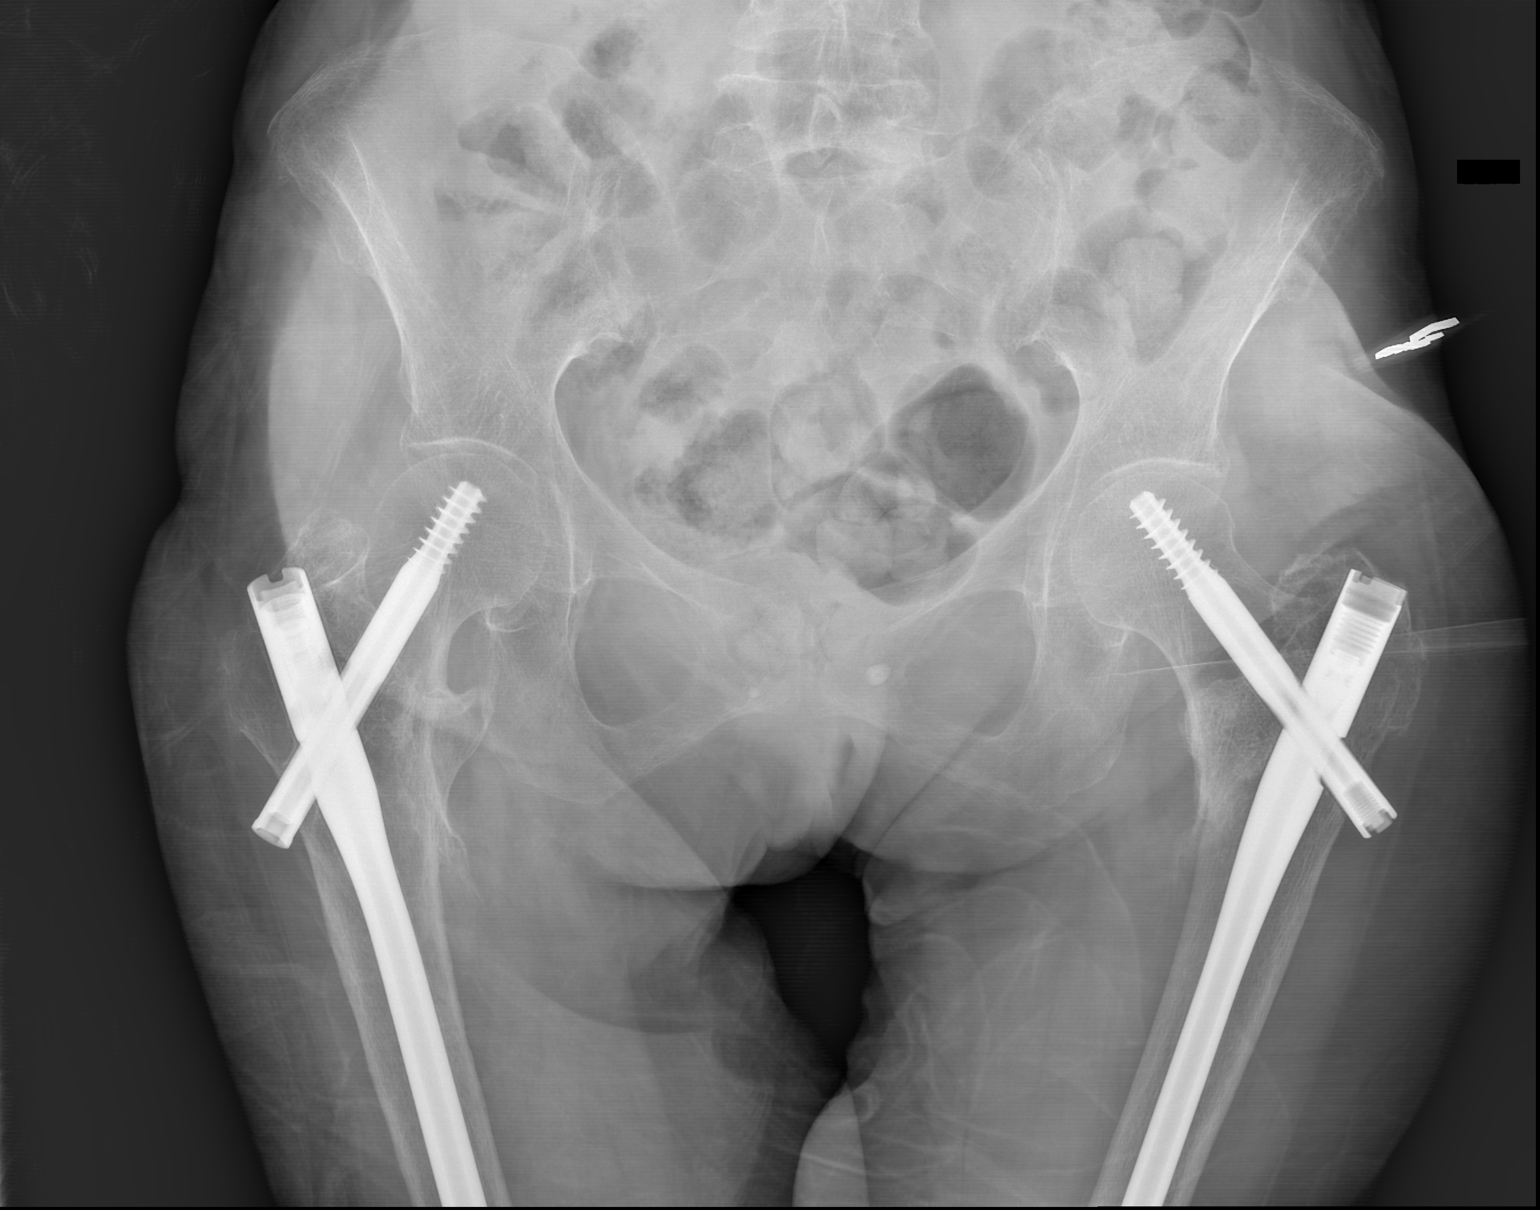

[x pelvis (2 of 2)]
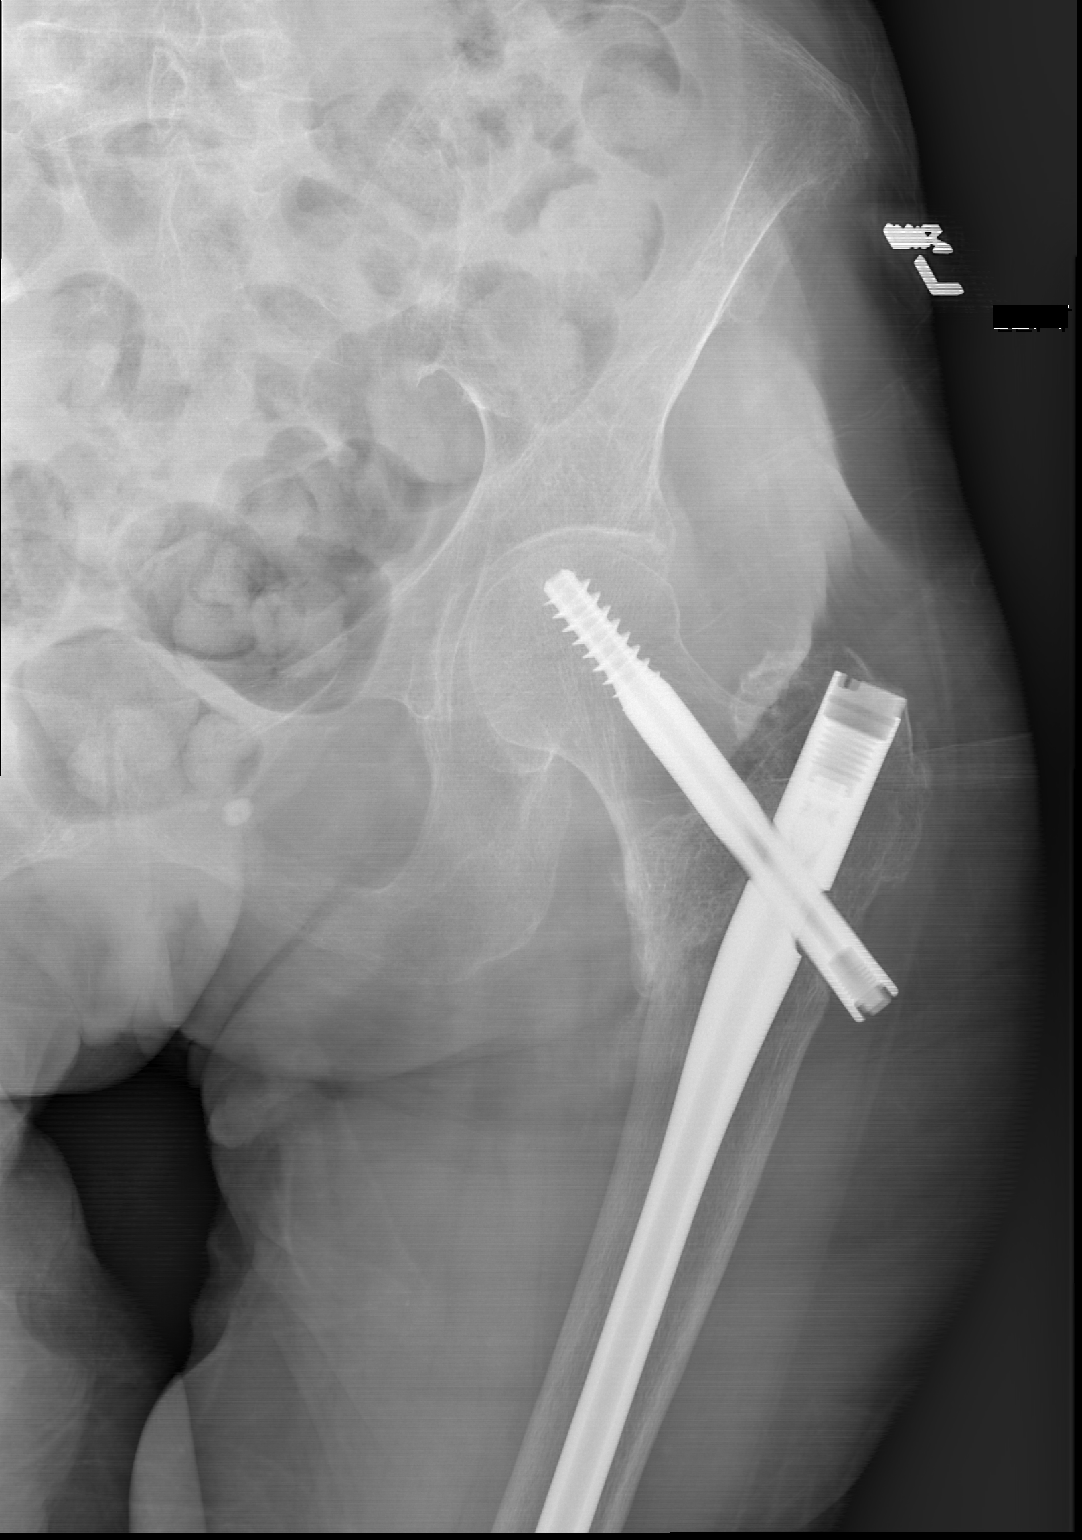

[w hip lat left]
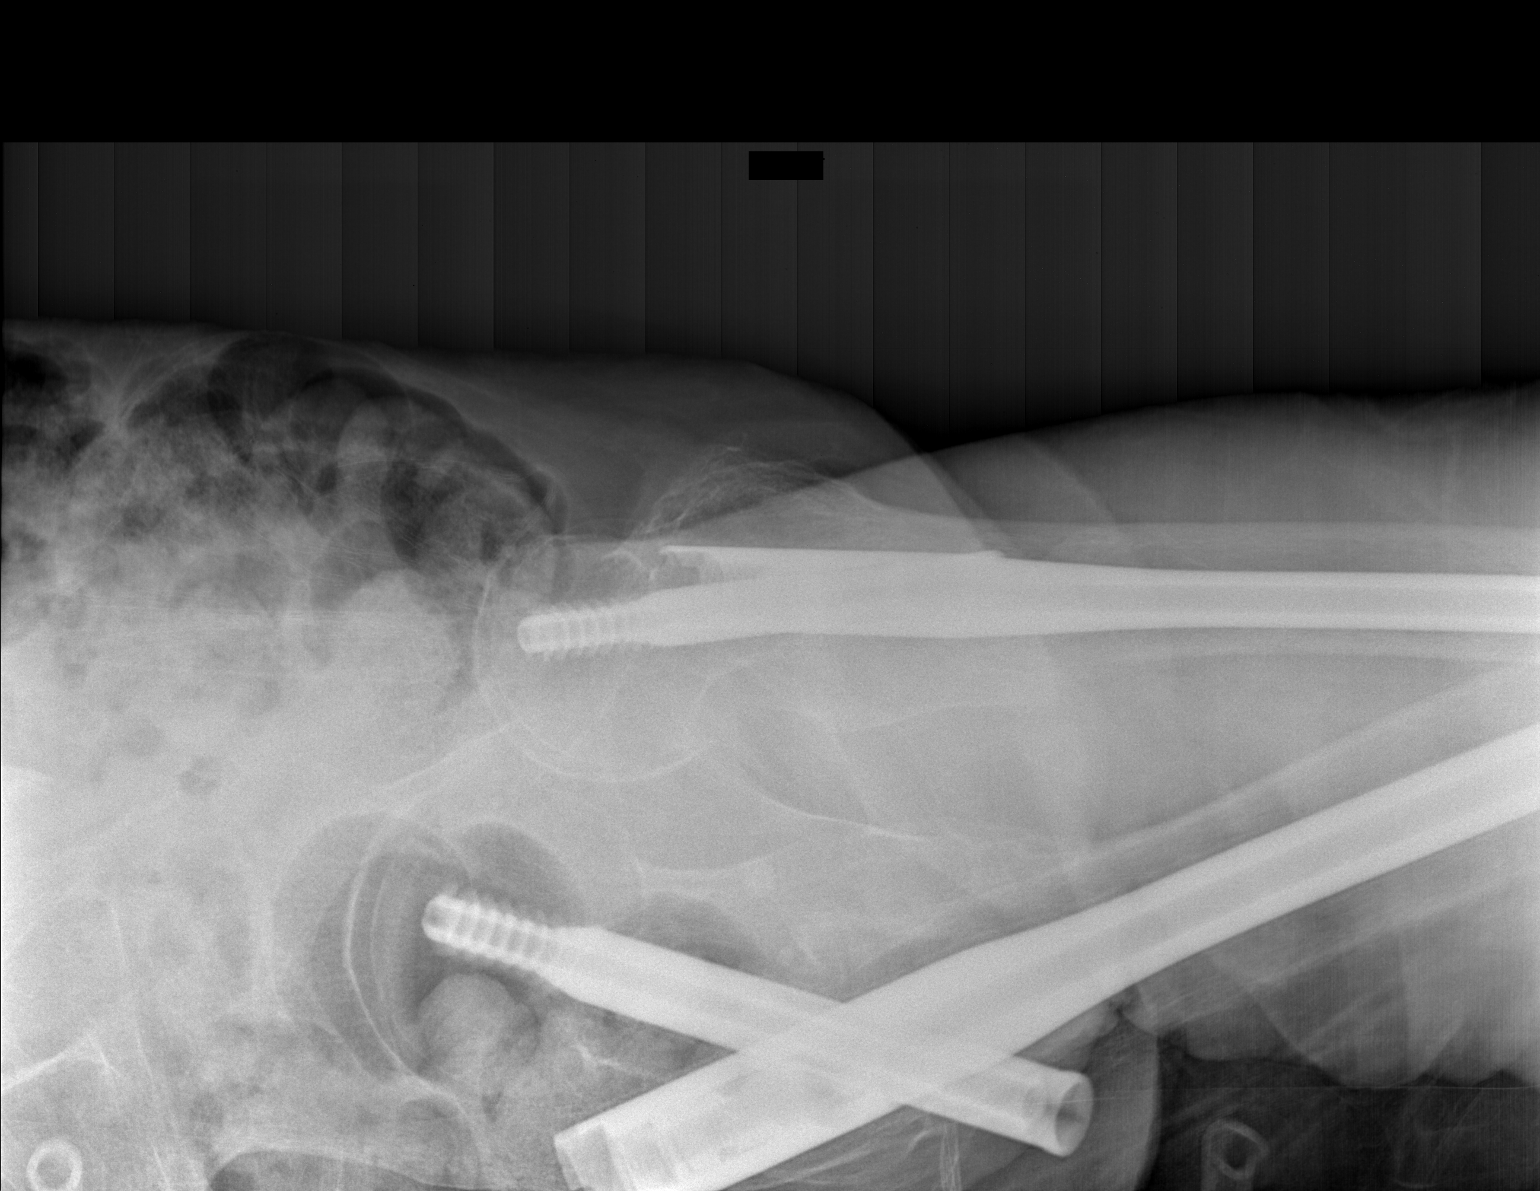

[3 of 3 positions shown; findings below may reference images not displayed]

FINDINGS: Status post bilateral internal fixation of intertrochanteric femur
fractures. The fracture on the left is recent, with signs of healing
since 07/20/2014. Visualized hardware is intact. There is no
periprosthetic fracture or dislocation. The pelvic ring appears
intact, as permitted by prominent gas and stool over the lower
abdomen and pelvis. Osteopenia.
IMPRESSION: No acute osseous findings.

## 2016-11-14 ENCOUNTER — Encounter: Payer: Self-pay | Admitting: Adult Health

## 2016-11-14 ENCOUNTER — Non-Acute Institutional Stay (SKILLED_NURSING_FACILITY): Payer: Medicare HMO | Admitting: Adult Health

## 2016-11-14 DIAGNOSIS — I10 Essential (primary) hypertension: Secondary | ICD-10-CM

## 2016-11-14 DIAGNOSIS — E781 Pure hyperglyceridemia: Secondary | ICD-10-CM | POA: Diagnosis not present

## 2016-11-14 DIAGNOSIS — E213 Hyperparathyroidism, unspecified: Secondary | ICD-10-CM

## 2016-11-14 DIAGNOSIS — F29 Unspecified psychosis not due to a substance or known physiological condition: Secondary | ICD-10-CM | POA: Diagnosis not present

## 2016-11-14 DIAGNOSIS — R69 Illness, unspecified: Secondary | ICD-10-CM | POA: Diagnosis not present

## 2016-11-14 DIAGNOSIS — R627 Adult failure to thrive: Secondary | ICD-10-CM

## 2016-11-14 DIAGNOSIS — D510 Vitamin B12 deficiency anemia due to intrinsic factor deficiency: Secondary | ICD-10-CM | POA: Diagnosis not present

## 2016-11-14 DIAGNOSIS — S72145S Nondisplaced intertrochanteric fracture of left femur, sequela: Secondary | ICD-10-CM

## 2016-11-14 NOTE — Progress Notes (Signed)
[facility]   [facility]   Place of Service:  SNF (31)   CODE STATUS: dnr  No Known Allergies  Chief Complaint  Patient presents with  . Medical Management of Chronic Issues    HPI:  She is a long term resident of this facility being seen for the management of her chronic illnesses. There is little change in her status. She is unable to participate in the hpi or ros. She does continue to get out of bed on a daily basis; is dependent on nursing for her daily care. There are no nursing concerns at this time.    Past Medical History:  Diagnosis Date  . Anemia   . Anxiety 07/26/2012  . CAD (coronary artery disease) 07/26/2012   Cath 12/26/05: LAD 10-15% mid stenosis and distally, LCIRC small and patent, RCA 15-20% mid stenosis. EF 45%, LV with mild anterolateral and mid inferior wall hypokinesia  Myoview : 12/13/2009: No evidence of inducible reversible ischemia EF 77%  . Closed intertrochanteric fracture of right hip (HCC) 07/26/2012  . Coronary artery disease   . Dementia   . Dementia without behavioral disturbance 07/26/2012  . History of tobacco use 07/26/2012  . HTN (hypertension) 07/26/2012  . Hyperlipidemia 07/26/2012  . Hyperparathyroidism (HCC) 07/26/2012  . Hypertension   . Leukocytosis 07/26/2012  . Osteoporosis   . Pernicious anemia 07/26/2012  . Vitamin D deficiency 07/26/2012    Past Surgical History:  Procedure Laterality Date  . INTRAMEDULLARY (IM) NAIL INTERTROCHANTERIC Left 07/20/2014   Procedure: INTRAMEDULLARY (IM) NAIL INTERTROCHANTRIC;  Surgeon: Sheral Apley, MD;  Location: [facility] OR;  Service: Orthopedics;  Laterality: Left;  . TONSILLECTOMY      Social History   Social History  . Marital status: Legally Separated    Spouse name: N/A  . Number of children: N/A  . Years of education: N/A   Occupational History  . Not on file.   Social History Main Topics  . Smoking status: Former Smoker    Packs/day: 0.50    Types: Cigarettes  . Smokeless tobacco: Not on file    . Alcohol use No  . Drug use: No  . Sexual activity: Not on file   Other Topics Concern  . Not on file   Social History Narrative  . No narrative on file   Family History  Problem Relation Age of Onset  . Breast cancer Mother   . Lung cancer Father       VITAL SIGNS BP 118/64   Pulse 66   Ht 5\' 1"  (1.549 m)   Wt 79 lb 9.6 oz (36.1 kg)   SpO2 94%   BMI 15.04 kg/m   Patient's Medications  New Prescriptions   No medications on file  Previous Medications   DOCUSATE SODIUM (COLACE) 100 MG CAPSULE    Take 1 capsule (100 mg total) by mouth 2 (two) times daily. Continue this while taking narcotics to help with bowel movements   FENOFIBRATE 54 MG TABLET    Take 54 mg by mouth every evening.    HYDROCODONE-ACETAMINOPHEN (NORCO) 5-325 MG TABLET    Take one tablet by mouth twice daily for pain. Not to exceed 3000mg  of APAP from all sources/24h   LORAZEPAM (ATIVAN) 0.5 MG TABLET    Take 0.5 mg by mouth 2 (two) times daily.   METOPROLOL (LOPRESSOR) 50 MG TABLET    Take 50 mg by mouth daily. Notify MD/NP if BP consistently > 150/90 x 3 readings or < 100/60 or  HR > 100 or < 50   MULTIPLE VITAMIN (DAILY VITE) TABS    Take 1 tablet by mouth daily.   NUTRITIONAL SUPPLEMENTS (NUTRITIONAL SUPPLEMENT PO)    Give House Supplement nutritional treat 4 oz three times daily by mouth with meals   POLYETHYLENE GLYCOL (MIRALAX / GLYCOLAX) PACKET    Take 17 g by mouth daily.   SENNA-DOCUSATE (SENOKOT-S) 8.6-50 MG PER TABLET    Take 2 tablets by mouth at bedtime.   VITAMIN B-12 (CYANOCOBALAMIN) 1000 MCG TABLET    Take 1,000 mcg by mouth daily. For anemia  Modified Medications   No medications on file  Discontinued Medications   No medications on file     SIGNIFICANT DIAGNOSTIC EXAMS    09-17-14: chest x-ray; rotated kyphotic projection hyperinflation with clear lungs   04-25-15: chest x-ray: no acute cardiopulmonary process  11-07-15: chest x-ray; no acute cardiopulmonary disease  process   LABS REVIEWED:    11-01-15: wbc 5.3; hgb 12.8; hct 41.1; mcv 88.2; plt 294; glucose 94; bun 20.1; creat 0.68; k+ 3.8; na++149; liver normal albumin 4.0  12-07-15: pth 88.790 07-26-16: wbc 6.2; hgb 11.1; hct 35.3; mcv 85.2; plt 334; glucose 94; bun 15.0; creat 0.65; k+ 4.2; na++ 140; liver normal albumin 4.3; hgb a1c 5.4; PTH 137.300; chol 190; ldl 84; trig 215; hdl 63      Review of Systems Unable to perform ROS: Dementia    Physical Exam Constitutional: No distress.  Frail Teeth are rotted to the gum line    Eyes: Conjunctivae are normal.  Neck: Neck supple. No JVD present. No thyromegaly present.  Cardiovascular: Normal rate, regular rhythm and intact distal pulses.   Respiratory: Effort normal and breath sounds normal. No respiratory distress. She has no wheezes.  GI: Soft. Bowel sounds are normal. She exhibits no distension. There is no tenderness.  Musculoskeletal: She exhibits no edema.  Able to move all extremities   Lymphadenopathy:    She has no cervical adenopathy.  Neurological: She is alert.  Skin: Skin is warm and dry. She is not diaphoretic.  Psychiatric: She has a normal mood and affect.     ASSESSMENT/ PLAN:   1. Hypertension: will continue lopressor 50 mg daily; will monitor  2. Constipation; will continue  miralax daily; and senna s 2 tabs nightly   3. Anemia: is on vitamin B 12:   her hgb is 11.1   4. Status post left hip fracture from August 2015: has chronic pain ; is on long term pain medication; vicodin 5/325 mg twice daily and will monitor   5. Psychosis: does talk to herself; is unable answer questions; no recent reports of behavioral issues present; will continue ativan 0.5 mg twice daily for anxiety and will monitor her status.    6. Dementia without behavioral disturbance: has declined in status; is presently not on medications; will not make changes will monitor  Her current weight is 79 pounds; her weight in Jan 2017 was 93 pounds.    7. Hypercalcemia: her calcium level is 11.9 . Her goals of care is comfort with her no hospitalization and dnr status.   8.  Hyperparathyroidism: Her PTH: is 137.300. She is not on medications; will not make changes will continue to monitor her status.   9. Hypertriglyceridemia: will continue fenofibrate 54 mg daily; her trig are 215  10. FTT:Her current weight is 79 pounds; her weight in Jan 2017 was 93 pounds.  Will continue supplements per facility protocol; remeron was  not effective in her gaining weight.         MD is aware of resident's narcotic use and is in agreement with current plan of care. We will attempt to wean resident as apropriate   Synthia Innocenteborah Green NP [facility]  Contact [phone] Monday through Friday 8am- 5pm  After hours call [phone]

## 2017-01-08 ENCOUNTER — Non-Acute Institutional Stay (SKILLED_NURSING_FACILITY): Payer: Medicare HMO | Admitting: Internal Medicine

## 2017-01-08 ENCOUNTER — Encounter: Payer: Self-pay | Admitting: Internal Medicine

## 2017-01-08 DIAGNOSIS — G501 Atypical facial pain: Secondary | ICD-10-CM | POA: Diagnosis not present

## 2017-01-08 DIAGNOSIS — R51 Headache: Secondary | ICD-10-CM | POA: Diagnosis not present

## 2017-01-08 DIAGNOSIS — M952 Other acquired deformity of head: Secondary | ICD-10-CM

## 2017-01-08 DIAGNOSIS — H1032 Unspecified acute conjunctivitis, left eye: Secondary | ICD-10-CM | POA: Diagnosis not present

## 2017-01-08 NOTE — Progress Notes (Signed)
Patient ID: Pamela Blackburn, female   DOB: [birth year], 81 y.o.   MRN: [id number]    DATE: 01/08/2017  Location:    [facility] Room Number: 219 B Place of Service: SNF (31)   Extended Emergency Contact Information Primary Emergency Contact: Kopplin,Lisa Address: [address]          [place], [place] [address] [place] Home Phone: [phone] Relation: Daughter Secondary Emergency Contact: Jones,Hunter  [place] Home Phone: [phone] Relation: Relative  Advanced Directive information Does Patient Have a Medical Advance Directive?: Yes, Type of Advance Directive: Out of facility DNR (pink MOST or yellow form), Pre-existing out of facility DNR order (yellow form or pink MOST form): Pink MOST form placed in chart (order not valid for inpatient use);Yellow form placed in chart (order not valid for inpatient use)  Chief Complaint  Patient presents with  . Acute Visit    Face swelling    HPI:  81 yo female long term resident seen today for facial swelling. Nursing reports pt with eye edema and open area at left temple. No known trauma. No falls.  No d/c, f/c. Unknown if she has visual changes. Pt is a poor historian due to dementia/psych d/o. Hx obtained from chart.    Past Medical History:  Diagnosis Date  . Anemia   . Anxiety 07/26/2012  . CAD (coronary artery disease) 07/26/2012   Cath 12/26/05: LAD 10-15% mid stenosis and distally, LCIRC small and patent, RCA 15-20% mid stenosis. EF 45%, LV with mild anterolateral and mid inferior wall hypokinesia  Myoview : 12/13/2009: No evidence of inducible reversible ischemia EF 77%  . Closed intertrochanteric fracture of right hip (HCC) 07/26/2012  . Coronary artery disease   . Dementia   . Dementia without behavioral disturbance 07/26/2012  . History of tobacco use 07/26/2012  . HTN (hypertension) 07/26/2012  . Hyperlipidemia 07/26/2012  . Hyperparathyroidism (HCC) 07/26/2012  . Hypertension   . Leukocytosis  07/26/2012  . Osteoporosis   . Pernicious anemia 07/26/2012  . Vitamin D deficiency 07/26/2012    Past Surgical History:  Procedure Laterality Date  . INTRAMEDULLARY (IM) NAIL INTERTROCHANTERIC Left 07/20/2014   Procedure: INTRAMEDULLARY (IM) NAIL INTERTROCHANTRIC;  Surgeon: Sheral Apley, MD;  Location: [facility] OR;  Service: Orthopedics;  Laterality: Left;  . TONSILLECTOMY      Patient Care Team: Kirt Boys, DO as PCP - General (Internal Medicine) Sharee Holster, NP as Nurse Practitioner (Geriatric Medicine) [facility]  Social History   Social History  . Marital status: Legally Separated    Spouse name: N/A  . Number of children: N/A  . Years of education: N/A   Occupational History  . Not on file.   Social History Main Topics  . Smoking status: Former Smoker    Packs/day: 0.50    Types: Cigarettes  . Smokeless tobacco: Not on file  . Alcohol use No  . Drug use: No  . Sexual activity: Not on file   Other Topics Concern  . Not on file   Social History Narrative  . No narrative on file     reports that she has quit smoking. Her smoking use included Cigarettes. She smoked 0.50 packs per day. She does not have any smokeless tobacco history on file. She reports that she does not drink alcohol or use drugs.  Family History  Problem Relation Age of Onset  . Breast cancer Mother   . Lung cancer Father  Family Status  Relation Status  . Mother Deceased  . Father Deceased    Immunization History  Administered Date(s) Administered  . Influenza-Unspecified 08/09/2015, 08/31/2016  . PPD Test 08/08/2016, 08/15/2016  . Pneumococcal Polysaccharide-23 08/02/2012  . Pneumococcal-Unspecified 01/22/2015    No Known Allergies  Medications: Patient's Medications  New Prescriptions   No medications on file  Previous Medications   DOCUSATE SODIUM (COLACE) 100 MG CAPSULE    Take 1 capsule (100 mg total) by mouth 2 (two) times  daily. Continue this while taking narcotics to help with bowel movements   FENOFIBRATE 54 MG TABLET    Take 54 mg by mouth every evening.    HYDROCODONE-ACETAMINOPHEN (NORCO) 5-325 MG TABLET    Take one tablet by mouth twice daily for pain. Not to exceed 3000mg  of APAP from all sources/24h   LORAZEPAM (ATIVAN) 0.5 MG TABLET    Take 0.5 mg by mouth 2 (two) times daily.   METOPROLOL (LOPRESSOR) 50 MG TABLET    Take 50 mg by mouth daily. Notify MD/NP if BP consistently > 150/90 x 3 readings or < 100/60 or HR > 100 or < 50   MULTIPLE VITAMIN (DAILY VITE) TABS    Take 1 tablet by mouth daily.   NUTRITIONAL SUPPLEMENTS (NUTRITIONAL SUPPLEMENT PO)    Give House Supplement nutritional treat 4 oz three times daily by mouth with meals   POLYETHYLENE GLYCOL (MIRALAX / GLYCOLAX) PACKET    Take 17 g by mouth daily.   SENNA-DOCUSATE (SENOKOT-S) 8.6-50 MG PER TABLET    Take 2 tablets by mouth at bedtime.   VITAMIN B-12 (CYANOCOBALAMIN) 1000 MCG TABLET    Take 1,000 mcg by mouth daily. For anemia  Modified Medications   No medications on file  Discontinued Medications   No medications on file    Review of Systems  Unable to perform ROS: Dementia    Vitals:   01/08/17 1402  BP: 124/78  Pulse: 73  Resp: 18  Temp: 98 F (36.7 C)  TempSrc: Oral  SpO2: 97%  Weight: 86 lb 4.8 oz (39.1 kg)  Height: 5\' 1"  (1.549 m)   Body mass index is 16.31 kg/m.  Physical Exam  Constitutional: She appears well-developed.  Frail appearing in NAD  HENT:  Head: Atraumatic. Head is without raccoon's eyes, without abrasion, without contusion, without laceration, without right periorbital erythema and without left periorbital erythema.    Mouth/Throat: Oropharynx is clear and moist. No oropharyngeal exudate.  Eyes: EOM are normal. Pupils are equal, round, and reactive to light. Right eye exhibits no discharge, no exudate and no hordeolum. No foreign body present in the right eye. Left eye exhibits discharge (green).  Left eye exhibits no hordeolum. No foreign body present in the left eye. Right conjunctiva is not injected. Right conjunctiva has no hemorrhage. Left conjunctiva is injected. Left conjunctiva has no hemorrhage. No scleral icterus.  OS red conjunctivae with swelling left upper lid, no orbital TTP  Neurological: She is alert.  Skin: Skin is warm and dry. No rash noted.  Psychiatric: She has a normal mood and affect. Her behavior is normal.     Labs reviewed: No visits with results within 3 Month(s) from this visit.  Latest known visit with results is:  Nursing Home on 09/03/2016  Component Date Value Ref Range Status  . Hemoglobin 07/26/2016 11.1* 12.0 - 16.0 g/dL Final  . HCT 16/08/9603 35* 36 - 46 % Final  . Platelets 07/26/2016 334  150 - 399 K/L  Final  . WBC 07/26/2016 6.2  10^3/mL Final  . Glucose 07/26/2016 94  mg/dL Final  . BUN 16/10/960409/12/2015 15  4 - 21 mg/dL Final  . Creatinine 54/09/811909/12/2015 0.7  0.5 - 1.1 mg/dL Final  . Potassium 14/78/295609/12/2015 4.2  3.4 - 5.3 mmol/L Final  . Sodium 07/26/2016 140  137 - 147 mmol/L Final  . Triglycerides 07/26/2016 215* 40 - 160 mg/dL Final  . Cholesterol 21/30/865709/12/2015 190  0 - 200 mg/dL Final  . HDL 84/69/629509/12/2015 63  35 - 70 mg/dL Final  . LDL Cholesterol 07/26/2016 84  mg/dL Final  . Alkaline Phosphatase 07/26/2016 113  25 - 125 U/L Final  . ALT 07/26/2016 10  7 - 35 U/L Final  . AST 07/26/2016 16  13 - 35 U/L Final  . Bilirubin, Total 07/26/2016 0.2  mg/dL Final  . Hemoglobin M8UA1C 07/26/2016 5.4   Final  . PTH 07/26/2016 137.300  pg/mL Final    No results found.   Assessment/Plan   ICD-9-CM ICD-10-CM   1. Acute conjunctivitis of left eye, unspecified acute conjunctivitis type 372.00 H10.32   2. 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 Ancil Linseyarter, D. O., F. A. C. O. I.  [facility] [address] [place], [place] [address] [phone] Cell (Monday-Friday 8 AM - 5 PM) [phone] After 5 PM and follow prompts

## 2017-01-13 DIAGNOSIS — M79675 Pain in left toe(s): Secondary | ICD-10-CM | POA: Diagnosis not present

## 2017-01-13 DIAGNOSIS — B351 Tinea unguium: Secondary | ICD-10-CM | POA: Diagnosis not present

## 2017-01-13 DIAGNOSIS — M79674 Pain in right toe(s): Secondary | ICD-10-CM | POA: Diagnosis not present

## 2017-01-13 DIAGNOSIS — I739 Peripheral vascular disease, unspecified: Secondary | ICD-10-CM | POA: Diagnosis not present

## 2017-01-20 ENCOUNTER — Non-Acute Institutional Stay (SKILLED_NURSING_FACILITY): Payer: Medicare HMO | Admitting: Adult Health

## 2017-01-20 ENCOUNTER — Encounter: Payer: Self-pay | Admitting: Adult Health

## 2017-01-20 DIAGNOSIS — K5901 Slow transit constipation: Secondary | ICD-10-CM | POA: Diagnosis not present

## 2017-01-20 DIAGNOSIS — E781 Pure hyperglyceridemia: Secondary | ICD-10-CM | POA: Diagnosis not present

## 2017-01-20 DIAGNOSIS — E213 Hyperparathyroidism, unspecified: Secondary | ICD-10-CM | POA: Diagnosis not present

## 2017-01-20 DIAGNOSIS — F0150 Vascular dementia without behavioral disturbance: Secondary | ICD-10-CM | POA: Diagnosis not present

## 2017-01-20 DIAGNOSIS — R69 Illness, unspecified: Secondary | ICD-10-CM | POA: Diagnosis not present

## 2017-01-20 DIAGNOSIS — S72145S Nondisplaced intertrochanteric fracture of left femur, sequela: Secondary | ICD-10-CM | POA: Diagnosis not present

## 2017-01-20 DIAGNOSIS — R627 Adult failure to thrive: Secondary | ICD-10-CM

## 2017-01-20 DIAGNOSIS — I10 Essential (primary) hypertension: Secondary | ICD-10-CM | POA: Diagnosis not present

## 2017-01-20 NOTE — Progress Notes (Signed)
Location:   [facility] Room Number: 219 B Place of Service:  SNF (31)   CODE STATUS: DNR  No Known Allergies  Chief Complaint  Patient presents with  . Medical Management of Chronic Issues    Routine Visit    HPI:  She is a long term resident of this facility being seen for the management of her chronic illnesses. Overall there is little change in her status. She does get out of bed daily. She is unable to participate in the hpi or ros. There are no nursing concerns at this time.    Past Medical History:  Diagnosis Date  . Anemia   . Anxiety 07/26/2012  . CAD (coronary artery disease) 07/26/2012   Cath 12/26/05: LAD 10-15% mid stenosis and distally, LCIRC small and patent, RCA 15-20% mid stenosis. EF 45%, LV with mild anterolateral and mid inferior wall hypokinesia  Myoview : 12/13/2009: No evidence of inducible reversible ischemia EF 77%  . Closed intertrochanteric fracture of right hip (HCC) 07/26/2012  . Coronary artery disease   . Dementia   . Dementia without behavioral disturbance 07/26/2012  . History of tobacco use 07/26/2012  . HTN (hypertension) 07/26/2012  . Hyperlipidemia 07/26/2012  . Hyperparathyroidism (HCC) 07/26/2012  . Hypertension   . Leukocytosis 07/26/2012  . Osteoporosis   . Pernicious anemia 07/26/2012  . Vitamin D deficiency 07/26/2012    Past Surgical History:  Procedure Laterality Date  . INTRAMEDULLARY (IM) NAIL INTERTROCHANTERIC Left 07/20/2014   Procedure: INTRAMEDULLARY (IM) NAIL INTERTROCHANTRIC;  Surgeon: Sheral Apleyimothy D Murphy, MD;  Location: [facility] OR;  Service: Orthopedics;  Laterality: Left;  . TONSILLECTOMY      Social History   Social History  . Marital status: Legally Separated    Spouse name: N/A  . Number of children: N/A  . Years of education: N/A   Occupational History  . Not on file.   Social History Main Topics  . Smoking status: Former Smoker    Packs/day: 0.50    Types: Cigarettes  . Smokeless tobacco: Never Used  . Alcohol use  No  . Drug use: No  . Sexual activity: Not on file   Other Topics Concern  . Not on file   Social History Narrative  . No narrative on file   Family History  Problem Relation Age of Onset  . Breast cancer Mother   . Lung cancer Father       VITAL SIGNS BP 116/64   Pulse 76   Temp 97.7 F (36.5 C)   Resp 18   Ht 5\' 1"  (1.549 m)   Wt 86 lb 8 oz (39.2 kg)   SpO2 95%   BMI 16.34 kg/m   Patient's Medications  New Prescriptions   No medications on file  Previous Medications   DOCUSATE SODIUM (COLACE) 100 MG CAPSULE    Take 1 capsule (100 mg total) by mouth 2 (two) times daily. Continue this while taking narcotics to help with bowel movements   FENOFIBRATE 54 MG TABLET    Take 54 mg by mouth every evening.    HYDROCODONE-ACETAMINOPHEN (NORCO) 5-325 MG TABLET    Take one tablet by mouth twice daily for pain. Not to exceed 3000mg  of APAP from all sources/24h   LORAZEPAM (ATIVAN) 0.5 MG TABLET    Take 0.5 mg by mouth 2 (two) times daily.   METOPROLOL (LOPRESSOR) 50 MG TABLET    Take 50 mg by mouth daily. Notify MD/NP if BP consistently > 150/90 x 3  readings or < 100/60 or HR > 100 or < 50   MULTIPLE VITAMIN (DAILY VITE) TABS    Take 1 tablet by mouth daily.   NUTRITIONAL SUPPLEMENTS (NUTRITIONAL SUPPLEMENT PO)    Give House Supplement nutritional treat 4 oz three times daily by mouth with meals   POLYETHYLENE GLYCOL (MIRALAX / GLYCOLAX) PACKET    Take 17 g by mouth daily.   SENNA-DOCUSATE (SENOKOT-S) 8.6-50 MG PER TABLET    Take 2 tablets by mouth at bedtime.   VITAMIN B-12 (CYANOCOBALAMIN) 1000 MCG TABLET    Take 1,000 mcg by mouth daily. For anemia  Modified Medications   No medications on file  Discontinued Medications   No medications on file     SIGNIFICANT DIAGNOSTIC EXAMS  09-17-14: chest x-ray; rotated kyphotic projection hyperinflation with clear lungs   04-25-15: chest x-ray: no acute cardiopulmonary process  11-07-15: chest x-ray; no acute cardiopulmonary  disease process   LABS REVIEWED:    07-26-16: wbc 6.2; hgb 11.1; hct 35.3; mcv 85.2; plt 334; glucose 94; bun 15.0; creat 0.65; k+ 4.2; na++ 140; liver normal albumin 4.3; hgb a1c 5.4; PTH 137.300; chol 190; ldl 84; trig 215; hdl 63      Review of Systems Unable to perform ROS: Dementia    Physical Exam Constitutional: No distress.  Frail Teeth are rotted to the gum line    Eyes: Conjunctivae are normal.  Neck: Neck supple. No JVD present. No thyromegaly present.  Cardiovascular: Normal rate, regular rhythm and intact distal pulses.   Respiratory: Effort normal and breath sounds normal. No respiratory distress. She has no wheezes.  GI: Soft. Bowel sounds are normal. She exhibits no distension. There is no tenderness.  Musculoskeletal: She exhibits no edema.  Able to move all extremities   Lymphadenopathy:    She has no cervical adenopathy.  Neurological: She is alert.  Skin: Skin is warm and dry. She is not diaphoretic.  Psychiatric: She has a normal mood and affect.     ASSESSMENT/ PLAN:   1. Hypertension: will continue lopressor 50 mg daily; will monitor  2. Constipation; will continue  miralax daily; and senna s 2 tabs nightly   3. Anemia: is on vitamin B 12:   her hgb is 11.1   4. Status post left hip fracture from August 2015: has chronic pain ; is on long term pain medication; vicodin 5/325 mg twice daily and will monitor   5. Psychosis: does talk to herself; is unable answer questions; no recent reports of behavioral issues present; will continue ativan 0.5 mg twice daily for anxiety and will monitor her status.    6. Dementia without behavioral disturbance: has declined in status; is presently not on medications; will not make changes will monitor  Her current weight is 86 pounds; her weight in Jan 2017 was 93 pounds.   7. Hypercalcemia: her calcium level is 11.9 . Her goals of care is comfort with her no hospitalization and dnr status.   8.   Hyperparathyroidism: Her PTH: is 137.300. She is not on medications; will not make changes will continue to monitor her status.   9. Hypertriglyceridemia: will continue fenofibrate 54 mg daily; her trig are 215  10. FTT:Her current weight is 86 pounds; her weight in Jan 2017 was 93 pounds.  Will continue supplements per facility protocol; remeron was not effective in her gaining weight.     MD is aware of resident's narcotic use and is in agreement with current plan of  care. We will attempt to wean resident as apropriate     Synthia Innocent NP [facility]  Contact [phone] Monday through Friday 8am- 5pm  After hours call [phone]

## 2017-01-21 DIAGNOSIS — E559 Vitamin D deficiency, unspecified: Secondary | ICD-10-CM | POA: Diagnosis not present

## 2017-01-21 DIAGNOSIS — N2581 Secondary hyperparathyroidism of renal origin: Secondary | ICD-10-CM | POA: Diagnosis not present

## 2017-01-21 LAB — CBC AND DIFFERENTIAL
HCT: 32 % — AB (ref 36–46)
Hemoglobin: 10.5 g/dL — AB (ref 12.0–16.0)
Neutrophils Absolute: 3 /uL
Platelets: 253 10*3/uL (ref 150–399)
WBC: 5.2 10^3/mL

## 2017-01-21 LAB — BASIC METABOLIC PANEL
BUN: 13 mg/dL (ref 4–21)
CREATININE: 0.7 mg/dL (ref 0.5–1.1)
Glucose: 95 mg/dL
POTASSIUM: 4.3 mmol/L (ref 3.4–5.3)
SODIUM: 142 mmol/L (ref 137–147)

## 2017-01-21 LAB — LIPID PANEL
Cholesterol: 161 mg/dL (ref 0–200)
HDL: 46 mg/dL (ref 35–70)
LDL CALC: 93 mg/dL
TRIGLYCERIDES: 111 mg/dL (ref 40–160)

## 2017-01-21 LAB — HEPATIC FUNCTION PANEL
ALK PHOS: 82 U/L (ref 25–125)
ALT: 8 U/L (ref 7–35)
AST: 14 U/L (ref 13–35)
Bilirubin, Total: 0.3 mg/dL

## 2017-01-21 LAB — VITAMIN D 25 HYDROXY (VIT D DEFICIENCY, FRACTURES): Vit D, 25-Hydroxy: 15.42

## 2017-02-13 ENCOUNTER — Non-Acute Institutional Stay (SKILLED_NURSING_FACILITY): Payer: Medicare HMO | Admitting: Adult Health

## 2017-02-13 ENCOUNTER — Encounter: Payer: Self-pay | Admitting: Adult Health

## 2017-02-13 DIAGNOSIS — R69 Illness, unspecified: Secondary | ICD-10-CM | POA: Diagnosis not present

## 2017-02-13 DIAGNOSIS — I10 Essential (primary) hypertension: Secondary | ICD-10-CM

## 2017-02-13 DIAGNOSIS — E213 Hyperparathyroidism, unspecified: Secondary | ICD-10-CM

## 2017-02-13 DIAGNOSIS — R627 Adult failure to thrive: Secondary | ICD-10-CM | POA: Diagnosis not present

## 2017-02-13 DIAGNOSIS — D510 Vitamin B12 deficiency anemia due to intrinsic factor deficiency: Secondary | ICD-10-CM | POA: Diagnosis not present

## 2017-02-13 DIAGNOSIS — E781 Pure hyperglyceridemia: Secondary | ICD-10-CM

## 2017-02-13 DIAGNOSIS — I25118 Atherosclerotic heart disease of native coronary artery with other forms of angina pectoris: Secondary | ICD-10-CM | POA: Diagnosis not present

## 2017-02-13 DIAGNOSIS — F0150 Vascular dementia without behavioral disturbance: Secondary | ICD-10-CM

## 2017-02-13 NOTE — Progress Notes (Signed)
Location:   [facility] Room Number: 219 B Place of Service:  SNF (31)   CODE STATUS: DNR  No Known Allergies  Chief Complaint  Patient presents with  . Medical Management of Chronic Issues    1 month follow up    HPI:  She is a long term resident of this facility being seen for the management of her chronic illnesses. There is little change in her status. She has had change from 89 to 84 pounds. Staff reports that she has a good appetite; and she is on supplements. She is unable to participate in the hpi or ros.    Past Medical History:  Diagnosis Date  . Anemia   . Anxiety 07/26/2012  . CAD (coronary artery disease) 07/26/2012   Cath 12/26/05: LAD 10-15% mid stenosis and distally, LCIRC small and patent, RCA 15-20% mid stenosis. EF 45%, LV with mild anterolateral and mid inferior wall hypokinesia  Myoview : 12/13/2009: No evidence of inducible reversible ischemia EF 77%  . Closed intertrochanteric fracture of right hip (HCC) 07/26/2012  . Coronary artery disease   . Dementia   . Dementia without behavioral disturbance 07/26/2012  . History of tobacco use 07/26/2012  . HTN (hypertension) 07/26/2012  . Hyperlipidemia 07/26/2012  . Hyperparathyroidism (HCC) 07/26/2012  . Hypertension   . Leukocytosis 07/26/2012  . Osteoporosis   . Pernicious anemia 07/26/2012  . Vitamin D deficiency 07/26/2012    Past Surgical History:  Procedure Laterality Date  . INTRAMEDULLARY (IM) NAIL INTERTROCHANTERIC Left 07/20/2014   Procedure: INTRAMEDULLARY (IM) NAIL INTERTROCHANTRIC;  Surgeon: Sheral Apley, MD;  Location: [facility] OR;  Service: Orthopedics;  Laterality: Left;  . TONSILLECTOMY      Social History   Social History  . Marital status: Legally Separated    Spouse name: N/A  . Number of children: N/A  . Years of education: N/A   Occupational History  . Not on file.   Social History Main Topics  . Smoking status: Former Smoker    Packs/day: 0.50    Types: Cigarettes  . Smokeless  tobacco: Never Used  . Alcohol use No  . Drug use: No  . Sexual activity: Not on file   Other Topics Concern  . Not on file   Social History Narrative  . No narrative on file   Family History  Problem Relation Age of Onset  . Breast cancer Mother   . Lung cancer Father       VITAL SIGNS BP (!) 145/60   Pulse 67   Temp 98.6 F (37 C)   Resp 18   Ht 5\' 1"  (1.549 m)   Wt 84 lb (38.1 kg)   SpO2 98%   BMI 15.87 kg/m   Patient's Medications  New Prescriptions   No medications on file  Previous Medications   DOCUSATE SODIUM (COLACE) 100 MG CAPSULE    Take 1 capsule (100 mg total) by mouth 2 (two) times daily. Continue this while taking narcotics to help with bowel movements   FENOFIBRATE 54 MG TABLET    Take 54 mg by mouth every evening.    HYDROCODONE-ACETAMINOPHEN (NORCO) 5-325 MG TABLET    Take one tablet by mouth twice daily for pain. Not to exceed 3000mg  of APAP from all sources/24h   LORAZEPAM (ATIVAN) 0.5 MG TABLET    Take 0.5 mg by mouth 2 (two) times daily.   METOPROLOL (LOPRESSOR) 50 MG TABLET    Take 50 mg by mouth daily. Notify MD/NP  if BP consistently > 150/90 x 3 readings or < 100/60 or HR > 100 or < 50   MULTIPLE VITAMIN (DAILY VITE) TABS    Take 1 tablet by mouth daily.   NUTRITIONAL SUPPLEMENTS (NUTRITIONAL SUPPLEMENT PO)    Give House Supplement nutritional treat 4 oz three times daily by mouth with meals   POLYETHYLENE GLYCOL (MIRALAX / GLYCOLAX) PACKET    Take 17 g by mouth daily.   SENNA-DOCUSATE (SENOKOT-S) 8.6-50 MG PER TABLET    Take 2 tablets by mouth at bedtime.   VITAMIN B-12 (CYANOCOBALAMIN) 1000 MCG TABLET    Take 1,000 mcg by mouth daily. For anemia  Modified Medications   No medications on file  Discontinued Medications   No medications on file     SIGNIFICANT DIAGNOSTIC EXAMS  09-17-14: chest x-ray; rotated kyphotic projection hyperinflation with clear lungs   04-25-15: chest x-ray: no acute cardiopulmonary process  11-07-15: chest  x-ray; no acute cardiopulmonary disease process   LABS REVIEWED:    07-26-16: wbc 6.2; hgb 11.1; hct 35.3; mcv 85.2; plt 334; glucose 94; bun 15.0; creat 0.65; k+ 4.2; na++ 140; liver normal albumin 4.3; hgb a1c 5.4; PTH 137.300; chol 190; ldl 84; trig 215; hdl 63  1-61-092-28-18: wbc 5.2; hgb 10.5; hct 32.3; mcv 87.8; plt 253; glucose 95; bun 13; creat 0.70; k+ 4.3; na++ 142 ca 10.9; liver normal albumin 3.2; chol 161; ldl 93; trig 111; hdl 46; PTH 84.290      Review of Systems Unable to perform ROS: Dementia    Physical Exam Constitutional: No distress.  Frail Teeth are rotted to the gum line    Eyes: Conjunctivae are normal.  Neck: Neck supple. No JVD present. No thyromegaly present.  Cardiovascular: Normal rate, regular rhythm and intact distal pulses.   Respiratory: Effort normal and breath sounds normal. No respiratory distress. She has no wheezes.  GI: Soft. Bowel sounds are normal. She exhibits no distension. There is no tenderness.  Musculoskeletal: She exhibits no edema.  Able to move all extremities   Lymphadenopathy:    She has no cervical adenopathy.  Neurological: She is alert.  Skin: Skin is warm and dry. She is not diaphoretic.  Psychiatric: She has a normal mood and affect.     ASSESSMENT/ PLAN:   1. Hypertension: has CAD: will continue lopressor 50 mg daily; will monitor  2. Constipation; will continue  miralax daily; and senna s 2 tabs nightly   3. Anemia: is on vitamin B 12:   her hgb is 10.5  4. Status post left hip fracture from August 2015: has chronic pain ; is on long term pain medication; vicodin 5/325 mg twice daily and will monitor   5. Psychosis: does talk to herself; is unable answer questions; no recent reports of behavioral issues present; will continue ativan 0.5 mg twice daily for anxiety and will monitor her status.    6. Dementia without behavioral disturbance: has declined in status; is presently not on medications; will not make changes will  monitor  Her current weight is 84 pounds; her weight in Jan 2017 was 93 pounds.   7. Hypercalcemia: her calcium level is 10.9 . Her goals of care is comfort with her no hospitalization and dnr status.   8.  Hyperparathyroidism: Her PTH: is 84.290 She is not on medications; will not make changes will continue to monitor her status.   9. Hypertriglyceridemia: trig 111; will stop her fenofibrate due to her weight loss   10.  FTT:Her current weight is 84 pounds; her weight in Jan 2017 was 93 pounds.  Will continue supplements per facility protocol; remeron was not effective in her gaining weight.   Will check pre-albumin   MD is aware of resident's narcotic use and is in agreement with current plan of care. We will attempt to wean resident as apropriate   Synthia Innocent NP [facility]  Contact [phone] Monday through Friday 8am- 5pm  After hours call [phone]

## 2017-02-16 DIAGNOSIS — I10 Essential (primary) hypertension: Secondary | ICD-10-CM | POA: Diagnosis not present

## 2017-02-16 DIAGNOSIS — D510 Vitamin B12 deficiency anemia due to intrinsic factor deficiency: Secondary | ICD-10-CM | POA: Diagnosis not present

## 2017-02-16 DIAGNOSIS — I2510 Atherosclerotic heart disease of native coronary artery without angina pectoris: Secondary | ICD-10-CM | POA: Diagnosis not present

## 2017-02-23 ENCOUNTER — Other Ambulatory Visit: Payer: Self-pay | Admitting: *Deleted

## 2017-02-23 MED ORDER — HYDROCODONE-ACETAMINOPHEN 5-325 MG PO TABS: ORAL_TABLET | ORAL | 0 refills | Status: DC

## 2017-02-23 NOTE — Telephone Encounter (Signed)
[facility] #[phone] Fax:[phone]  

## 2017-03-11 ENCOUNTER — Other Ambulatory Visit: Payer: Self-pay | Admitting: *Deleted

## 2017-03-11 DIAGNOSIS — R69 Illness, unspecified: Secondary | ICD-10-CM | POA: Diagnosis not present

## 2017-03-11 DIAGNOSIS — F419 Anxiety disorder, unspecified: Secondary | ICD-10-CM | POA: Diagnosis not present

## 2017-03-11 MED ORDER — LORAZEPAM 0.5 MG PO TABS: ORAL_TABLET | ORAL | 5 refills | Status: DC

## 2017-03-11 NOTE — Telephone Encounter (Signed)
[facility] #[phone] Fax:[phone]  

## 2017-03-18 ENCOUNTER — Encounter: Payer: Self-pay | Admitting: Adult Health

## 2017-03-18 ENCOUNTER — Non-Acute Institutional Stay (SKILLED_NURSING_FACILITY): Payer: Medicare HMO | Admitting: Adult Health

## 2017-03-18 DIAGNOSIS — I10 Essential (primary) hypertension: Secondary | ICD-10-CM | POA: Diagnosis not present

## 2017-03-18 DIAGNOSIS — S72145S Nondisplaced intertrochanteric fracture of left femur, sequela: Secondary | ICD-10-CM

## 2017-03-18 DIAGNOSIS — E781 Pure hyperglyceridemia: Secondary | ICD-10-CM | POA: Diagnosis not present

## 2017-03-18 DIAGNOSIS — D510 Vitamin B12 deficiency anemia due to intrinsic factor deficiency: Secondary | ICD-10-CM

## 2017-03-18 DIAGNOSIS — I25118 Atherosclerotic heart disease of native coronary artery with other forms of angina pectoris: Secondary | ICD-10-CM

## 2017-03-18 DIAGNOSIS — R627 Adult failure to thrive: Secondary | ICD-10-CM | POA: Diagnosis not present

## 2017-03-18 DIAGNOSIS — E213 Hyperparathyroidism, unspecified: Secondary | ICD-10-CM | POA: Diagnosis not present

## 2017-03-18 NOTE — Progress Notes (Signed)
Location:   [facility] Room Number: 219 B Place of Service:  SNF (31)   CODE STATUS: DNR  No Known Allergies  Chief Complaint  Patient presents with  . Medical Management of Chronic Issues    1 month follow up    HPI:  She is a long term resident of this facility being seen for the management of her chronic illnesses. She is unable to participate in the hpi or ros. She does get out of bed daily. She will chew on her fingers on occasions. There are no nursing concerns at this time.    Past Medical History:  Diagnosis Date  . Anemia   . Anxiety 07/26/2012  . CAD (coronary artery disease) 07/26/2012   Cath 12/26/05: LAD 10-15% mid stenosis and distally, LCIRC small and patent, RCA 15-20% mid stenosis. EF 45%, LV with mild anterolateral and mid inferior wall hypokinesia  Myoview : 12/13/2009: No evidence of inducible reversible ischemia EF 77%  . Closed intertrochanteric fracture of right hip (HCC) 07/26/2012  . Coronary artery disease   . Dementia   . Dementia without behavioral disturbance 07/26/2012  . History of tobacco use 07/26/2012  . HTN (hypertension) 07/26/2012  . Hyperlipidemia 07/26/2012  . Hyperparathyroidism (HCC) 07/26/2012  . Hypertension   . Leukocytosis 07/26/2012  . Osteoporosis   . Pernicious anemia 07/26/2012  . Vitamin D deficiency 07/26/2012    Past Surgical History:  Procedure Laterality Date  . INTRAMEDULLARY (IM) NAIL INTERTROCHANTERIC Left 07/20/2014   Procedure: INTRAMEDULLARY (IM) NAIL INTERTROCHANTRIC;  Surgeon: Sheral Apley, MD;  Location: [facility] OR;  Service: Orthopedics;  Laterality: Left;  . TONSILLECTOMY      Social History   Social History  . Marital status: Legally Separated    Spouse name: N/A  . Number of children: N/A  . Years of education: N/A   Occupational History  . Not on file.   Social History Main Topics  . Smoking status: Former Smoker    Packs/day: 0.50    Types: Cigarettes  . Smokeless tobacco: Never Used  . Alcohol use  No  . Drug use: No  . Sexual activity: Not on file   Other Topics Concern  . Not on file   Social History Narrative  . No narrative on file   Family History  Problem Relation Age of Onset  . Breast cancer Mother   . Lung cancer Father       VITAL SIGNS BP 122/68   Pulse 66   Temp 98.4 F (36.9 C)   Resp 18   Ht  (1.549 m)   Wt 84 lb 8 oz (38.3 kg)   SpO2 97%   BMI 15.97 kg/m   Patient's Medications  New Prescriptions   No medications on file  Previous Medications   DOCUSATE SODIUM (COLACE) 100 MG CAPSULE    Take 1 capsule (100 mg total) by mouth 2 (two) times daily. Continue this while taking narcotics to help with bowel movements   HYDROCODONE-ACETAMINOPHEN (NORCO) 5-325 MG TABLET    Take one tablet by mouth twice daily for pain. Not to exceed  of APAP from all sources/24h   LORAZEPAM (ATIVAN) 0.5 MG TABLET    Take one tablet by mouth at bedtime for rest; Take 1/2 tablet by mouth every morning   METOPROLOL (LOPRESSOR) 50 MG TABLET    Take 50 mg by mouth daily. Notify MD/NP if BP consistently > 150/90 x 3 readings or < 100/60 or HR > 100  or < 50   MULTIPLE VITAMIN (DAILY VITE) TABS    Take 1 tablet by mouth daily.   NUTRITIONAL SUPPLEMENTS (NUTRITIONAL SUPPLEMENT PO)    Give House Supplement nutritional treat 4 oz three times daily by mouth with meals   POLYETHYLENE GLYCOL (MIRALAX / GLYCOLAX) PACKET    Take 17 g by mouth daily.   SENNA-DOCUSATE (SENOKOT-S) 8.6-50 MG PER TABLET    Take 2 tablets by mouth at bedtime.   UNABLE TO FIND    HSG Regular Diet - HSG Puree texture, Regular consistency, Large portions   VITAMIN B-12 (CYANOCOBALAMIN) 1000 MCG TABLET    Take 1,000 mcg by mouth daily. For anemia  Modified Medications   No medications on file  Discontinued Medications   FENOFIBRATE 54 MG TABLET    Take 54 mg by mouth every evening.      SIGNIFICANT DIAGNOSTIC EXAMS  09-17-14: chest x-ray; rotated kyphotic projection hyperinflation with clear lungs    04-25-15: chest x-ray: no acute cardiopulmonary process  11-07-15: chest x-ray; no acute cardiopulmonary disease process   LABS REVIEWED:    07-26-16: wbc 6.2; hgb 11.1; hct 35.3; mcv 85.2; plt 334; glucose 94; bun 15.0; creat 0.65; k+ 4.2; na++ 140; liver normal albumin 4.3; hgb a1c 5.4; PTH 137.300; chol 190; ldl 84; trig 215; hdl 63  01-30-64: wbc 5.2; hgb 10.5; hct 32.3; mcv 87.8; plt 253; glucose 95; bun 13; creat 0.70; k+ 4.3; na++ 142 ca 10.9; liver normal albumin 3.2; chol 161; ldl 93; trig 111; hdl 46; PTH 84.290      Review of Systems Unable to perform ROS: Dementia    Physical Exam Constitutional: No distress.  Frail Teeth are rotted to the gum line    Eyes: Conjunctivae are normal.  Neck: Neck supple. No JVD present. No thyromegaly present.  Cardiovascular: Normal rate, regular rhythm and intact distal pulses.   Respiratory: Effort normal and breath sounds normal. No respiratory distress. She has no wheezes.  GI: Soft. Bowel sounds are normal. She exhibits no distension. There is no tenderness.  Musculoskeletal: She exhibits no edema.  Able to move all extremities   Lymphadenopathy:    She has no cervical adenopathy.  Neurological: She is alert.  Skin: Skin is warm and dry. She is not diaphoretic.  Psychiatric: She has a normal mood and affect.     ASSESSMENT/ PLAN:   1. Hypertension: 122/68 has CAD: will continue lopressor 50 mg daily; will monitor  2. Constipation; will continue  miralax daily; and senna s 2 tabs nightly   3. Anemia: is on vitamin B 12:   her hgb is 10.5  4. Chronic pain due to  left hip fracture from August 2015:is on long term pain medication; vicodin 5/325 mg twice daily and will monitor   5. Psychosis: does talk to herself; is unable answer questions; no recent reports of behavioral issues present; will continue ativan 0.25 mg in the AM and 0.5 mg in th PM   for anxiety and will monitor her status.    6. Dementia without behavioral  disturbance: has declined in status; is presently not on medications; will not make changes will monitor  Her current weight is 84 pounds; her weight in Jan 2017 was 93 pounds.  Unfortunately weight loss is an expected out come in the late stages of dementia.   7. Hypercalcemia: her calcium level is 10.9 . Her goals of care is comfort with her no hospitalization and dnr status.   8.  Hyperparathyroidism: Her PTH: is 84.290 She is not on medications; will not make changes will continue to monitor her status.   9. Hypertriglyceridemia: trig 111; her fenofibrate was stopped due to weight loss    10. FTT:Her current weight is 84 pounds; her weight in Jan 2017 was 93 pounds.  Will continue supplements per facility protocol; remeron was not effective in her gaining weight.     MD is aware of resident's narcotic use and is in agreement with current plan of care. We will attempt to wean resident as apropriate   Synthia Innocent NP [facility]  Contact [phone] Monday through Friday 8am- 5pm  After hours call [phone]

## 2017-03-26 ENCOUNTER — Other Ambulatory Visit: Payer: Self-pay

## 2017-03-26 DIAGNOSIS — F419 Anxiety disorder, unspecified: Secondary | ICD-10-CM | POA: Diagnosis not present

## 2017-03-26 DIAGNOSIS — R69 Illness, unspecified: Secondary | ICD-10-CM | POA: Diagnosis not present

## 2017-03-26 DIAGNOSIS — F29 Unspecified psychosis not due to a substance or known physiological condition: Secondary | ICD-10-CM | POA: Diagnosis not present

## 2017-03-26 DIAGNOSIS — F39 Unspecified mood [affective] disorder: Secondary | ICD-10-CM | POA: Diagnosis not present

## 2017-03-26 MED ORDER — HYDROCODONE-ACETAMINOPHEN 5-325 MG PO TABS: ORAL_TABLET | ORAL | 0 refills | Status: DC

## 2017-03-26 NOTE — Telephone Encounter (Signed)
RX faxed to [facility] fax # [phone] Phone #[phone] 

## 2017-03-27 ENCOUNTER — Other Ambulatory Visit: Payer: Self-pay | Admitting: *Deleted

## 2017-03-27 MED ORDER — LORAZEPAM 0.5 MG PO TABS: ORAL_TABLET | ORAL | 0 refills | Status: DC

## 2017-03-27 NOTE — Telephone Encounter (Signed)
[facility] #[phone] Fax:[phone]  

## 2017-04-16 ENCOUNTER — Encounter: Payer: Self-pay | Admitting: Internal Medicine

## 2017-04-16 ENCOUNTER — Non-Acute Institutional Stay (SKILLED_NURSING_FACILITY): Payer: Medicare HMO | Admitting: Internal Medicine

## 2017-04-16 DIAGNOSIS — E781 Pure hyperglyceridemia: Secondary | ICD-10-CM

## 2017-04-16 DIAGNOSIS — I10 Essential (primary) hypertension: Secondary | ICD-10-CM

## 2017-04-16 DIAGNOSIS — K5901 Slow transit constipation: Secondary | ICD-10-CM

## 2017-04-16 DIAGNOSIS — F29 Unspecified psychosis not due to a substance or known physiological condition: Secondary | ICD-10-CM | POA: Diagnosis not present

## 2017-04-16 DIAGNOSIS — F0150 Vascular dementia without behavioral disturbance: Secondary | ICD-10-CM | POA: Diagnosis not present

## 2017-04-16 DIAGNOSIS — E559 Vitamin D deficiency, unspecified: Secondary | ICD-10-CM | POA: Diagnosis not present

## 2017-04-16 DIAGNOSIS — G894 Chronic pain syndrome: Secondary | ICD-10-CM

## 2017-04-16 DIAGNOSIS — R627 Adult failure to thrive: Secondary | ICD-10-CM | POA: Diagnosis not present

## 2017-04-16 DIAGNOSIS — E213 Hyperparathyroidism, unspecified: Secondary | ICD-10-CM | POA: Diagnosis not present

## 2017-04-16 DIAGNOSIS — R634 Abnormal weight loss: Secondary | ICD-10-CM | POA: Diagnosis not present

## 2017-04-16 DIAGNOSIS — R69 Illness, unspecified: Secondary | ICD-10-CM | POA: Diagnosis not present

## 2017-04-16 NOTE — Progress Notes (Signed)
Patient ID: Pamela Blackburn, female   DOB: 1936-07-13, 81 y.o.   MRN: [id number]    DATE:04/16/2017  Location:    [facility] Room Number: 219 B Place of Service: SNF (31)   Extended Emergency Contact Information Primary Emergency Contact: Heeter,Lisa Address: [address]          [place], [place] [address] [place] Home Phone: [phone] Relation: Daughter Secondary Emergency Contact: Jones,Hunter  [place] Home Phone: [phone] Relation: Relative  Advanced Directive information Does Patient Have a Medical Advance Directive?: Yes, Type of Advance Directive: Out of facility DNR (pink MOST or yellow form), Pre-existing out of facility DNR order (yellow form or pink MOST form): Pink MOST form placed in chart (order not valid for inpatient use);Yellow form placed in chart (order not valid for inpatient use), Does patient want to make changes to medical advance directive?: No - Patient declined  Chief Complaint  Patient presents with  . Medical Management of Chronic Issues    Routine Visit    HPI:  81 yo female long term resident seen today for f/u. She has no concerns. No nursing issues. She is a poor historian due to dementia. Hx obtained from chart.  Hypertension/hx CAD - stable on lopressor 50 mg daily  Chronic Constipation - stable on miralax daily; senna s 2 tabs nightly   Hx Anemia - stable on vitamin B 12. Last Hgb 10.5  Chronic pain 2/2 left hip fracture in August 2015 - pain stable on long term vicodin 5/325 mg twice daily   Psychosis/anxiety - stable. she talks to herself; unable answer questions; no recent reports of behavioral issues present; takes ativan 0.25 mg in the AM and 0.5 mg in th PM.    Dementia without behavioral disturbance - continues to decline. She takes no meds for cognition. weight 80 lbs (prev 84 lbs). She has lost 6 lbs since Feb 2018. Unfortunately weight loss is an expected out come in the late stages  of dementia.   Hypercalcemia/hyperPTHdism -  her calcium level is 10.9. PTH 84.290. GOAL IS COMFORT CARE WITH NO HOSPITALIZATIONS. She is DNR   Hypertriglyceridemia -  TG 111. Fenofibrate was stopped due to weight loss    FTT - Her current weight is 80 pounds  (down 4 lbs since last month); she gets nutritional supplements per facility protocol; remeron was not effective in gaining weight.   Past Medical History:  Diagnosis Date  . Anemia   . Anxiety 07/26/2012  . CAD (coronary artery disease) 07/26/2012   Cath 12/26/05: LAD 10-15% mid stenosis and distally, LCIRC small and patent, RCA 15-20% mid stenosis. EF 45%, LV with mild anterolateral and mid inferior wall hypokinesia  Myoview : 12/13/2009: No evidence of inducible reversible ischemia EF 77%  . Closed intertrochanteric fracture of right hip (HCC) 07/26/2012  . Coronary artery disease   . Dementia   . Dementia without behavioral disturbance 07/26/2012  . History of tobacco use 07/26/2012  . HTN (hypertension) 07/26/2012  . Hyperlipidemia 07/26/2012  . Hyperparathyroidism (HCC) 07/26/2012  . Hypertension   . Leukocytosis 07/26/2012  . Osteoporosis   . Pernicious anemia 07/26/2012  . Vitamin D deficiency 07/26/2012    Past Surgical History:  Procedure Laterality Date  . INTRAMEDULLARY (IM) NAIL INTERTROCHANTERIC Left 07/20/2014   Procedure: INTRAMEDULLARY (IM) NAIL INTERTROCHANTRIC;  Surgeon: Sheral Apley, MD;  Location: [facility] OR;  Service: Orthopedics;  Laterality: Left;  . TONSILLECTOMY      Patient  Care Team: Kirt Boysarter, Doralene Glanz, DO as PCP - General (Internal Medicine) Chilton SiGreen, Chong Sicilianeborah S, NP as Nurse Practitioner (Geriatric Medicine) [facility]  Social History   Social History  . Marital status: Legally Separated    Spouse name: N/A  . Number of children: N/A  . Years of education: N/A   Occupational History  . Not on file.   Social History Main Topics  . Smoking status: Former Smoker     Packs/day: 0.50    Types: Cigarettes  . Smokeless tobacco: Never Used  . Alcohol use No  . Drug use: No  . Sexual activity: Not on file   Other Topics Concern  . Not on file   Social History Narrative  . No narrative on file     reports that she has quit smoking. Her smoking use included Cigarettes. She smoked 0.50 packs per day. She has never used smokeless tobacco. She reports that she does not drink alcohol or use drugs.  Family History  Problem Relation Age of Onset  . Breast cancer Mother   . Lung cancer Father    Family Status  Relation Status  . Mother Deceased  . Father Deceased    Immunization History  Administered Date(s) Administered  . Influenza-Unspecified 08/09/2015, 08/31/2016  . PPD Test 08/08/2016, 08/15/2016  . Pneumococcal Polysaccharide-23 08/02/2012  . Pneumococcal-Unspecified 01/22/2015    No Known Allergies  Medications: Patient's Medications  New Prescriptions   No medications on file  Previous Medications   DOCUSATE SODIUM (COLACE) 100 MG CAPSULE    Take 1 capsule (100 mg total) by mouth 2 (two) times daily. Continue this while taking narcotics to help with bowel movements   HYDROCODONE-ACETAMINOPHEN (NORCO) 5-325 MG TABLET    Take one tablet by mouth twice daily for pain. Not to exceed 3000mg  of APAP from all sources/24h   LORAZEPAM (ATIVAN) 0.5 MG TABLET    Take 0.5 mg by mouth at bedtime.   METOPROLOL (LOPRESSOR) 50 MG TABLET    Take 50 mg by mouth daily. Notify MD/NP if BP consistently > 150/90 x 3 readings or < 100/60 or HR > 100 or < 50   MULTIPLE VITAMIN (DAILY VITE) TABS    Take 1 tablet by mouth daily.   NUTRITIONAL SUPPLEMENTS (NUTRITIONAL SUPPLEMENT PO)    Give House Supplement nutritional treat 4 oz three times daily by mouth with meals   POLYETHYLENE GLYCOL (MIRALAX / GLYCOLAX) PACKET    Take 17 g by mouth daily.   SENNA-DOCUSATE (SENOKOT-S) 8.6-50 MG PER TABLET    Take 2 tablets by mouth at bedtime.   UNABLE TO FIND    HSG  Regular Diet - HSG Puree texture, Regular consistency, Large portions   VITAMIN B-12 (CYANOCOBALAMIN) 1000 MCG TABLET    Take 1,000 mcg by mouth daily. For anemia  Modified Medications   No medications on file  Discontinued Medications   LORAZEPAM (ATIVAN) 0.5 MG TABLET    Take one tablet by mouth at bedtime for rest; Take 1/2 tablet by mouth every morning    Review of Systems  Unable to perform ROS: Dementia    Vitals:   04/16/17 1424  BP: 116/67  Pulse: 63  Resp: 20  Temp: 97.2 F (36.2 C)  TempSrc: Oral  SpO2: 97%  Weight: 80 lb 1.6 oz (36.3 kg)  Height: 5\' 1"  (1.549 m)   Body mass index is 15.13 kg/m.  Physical Exam  Constitutional: She appears well-developed.  Frail appearing in NAD,  sitting in w/c. She is asleep but easily awakened  HENT:  Mouth/Throat: Oropharynx is clear and moist. No oropharyngeal exudate.  Eyes: Pupils are equal, round, and reactive to light. No scleral icterus.  Neck: Neck supple. Carotid bruit is not present. No tracheal deviation present.  Cardiovascular: Normal rate, regular rhythm and intact distal pulses.  Exam reveals no gallop and no friction rub.   Murmur (1/6 SEM) heard. No LE edema b/l. no calf TTP.   Pulmonary/Chest: Effort normal and breath sounds normal. No stridor. No respiratory distress. She has no wheezes. She has no rales.  Abdominal: Soft. Bowel sounds are normal. She exhibits no distension and no mass. There is no hepatomegaly. There is no tenderness. There is no rebound and no guarding.  Lymphadenopathy:    She has no cervical adenopathy.  Neurological: She is alert.  Skin: Skin is warm and dry. No rash noted.  Psychiatric: She has a normal mood and affect. Her behavior is normal.     Labs reviewed: Nursing Home on 02/13/2017  Component Date Value Ref Range Status  . Triglycerides 01/21/2017 111  40 - 160 mg/dL Final  . Cholesterol 16/08/9603 161  0 - 200 mg/dL Final  . HDL 54/07/8118 46  35 - 70 mg/dL Final  . LDL  Cholesterol 01/21/2017 93  mg/dL Final  . Hemoglobin 14/78/2956 10.5* 12.0 - 16.0 g/dL Final  . HCT 21/30/8657 32* 36 - 46 % Final  . Neutrophils Absolute 01/21/2017 3  /L Final  . Platelets 01/21/2017 253  150 - 399 K/L Final  . WBC 01/21/2017 5.2  10^3/mL Final  . Vit D, 25-Hydroxy 01/21/2017 15.42   Final  . Glucose 01/21/2017 95  mg/dL Final  . BUN 84/69/6295 13  4 - 21 mg/dL Final  . Creatinine 28/41/3244 0.7  0.5 - 1.1 mg/dL Final  . Potassium 11/26/7251 4.3  3.4 - 5.3 mmol/L Final  . Sodium 01/21/2017 142  137 - 147 mmol/L Final  . Alkaline Phosphatase 01/21/2017 82  25 - 125 U/L Final  . ALT 01/21/2017 8  7 - 35 U/L Final  . AST 01/21/2017 14  13 - 35 U/L Final  . Bilirubin, Total 01/21/2017 0.3  mg/dL Final    No results found.   Assessment/Plan   ICD-10-CM   1. Weight loss R63.4   2. Failure to thrive in adult R62.7   3. Vitamin D deficiency E55.9   4. Hyperparathyroidism (HCC) E21.3   5. Hypertriglyceridemia E78.1   6. Psychosis, unspecified psychosis type F29   7. Chronic pain syndrome G89.4   8. Slow transit constipation K59.01   9. Essential hypertension I10   10. Vascular dementia without behavioral disturbance F01.50     Check CMP, TSH, CBC w diff  Labs reviewed. Vit D 25OH level 15.42 in Feb 2018. Start Vitamin D 50k units weekly x 6 then take Vitamin D3 2000 units daily  Cont other meds as ordered  Cont nutritional supplements as ordered  PT/OT/ST as indicated  Will follow  Chibuikem Thang S. Ancil Linsey  [facility] [address] [place], [place] [address] [phone] Cell (Monday-Friday 8 AM - 5 PM) [phone] After 5 PM and follow prompts

## 2017-04-18 DIAGNOSIS — Z79899 Other long term (current) drug therapy: Secondary | ICD-10-CM | POA: Diagnosis not present

## 2017-04-18 DIAGNOSIS — D649 Anemia, unspecified: Secondary | ICD-10-CM | POA: Diagnosis not present

## 2017-04-18 DIAGNOSIS — E039 Hypothyroidism, unspecified: Secondary | ICD-10-CM | POA: Diagnosis not present

## 2017-04-18 LAB — BASIC METABOLIC PANEL
BUN: 12 (ref 4–21)
Creatinine: 0.5 (ref 0.5–1.1)
GLUCOSE: 92
Potassium: 4.3 (ref 3.4–5.3)
SODIUM: 141 (ref 137–147)

## 2017-04-18 LAB — CBC AND DIFFERENTIAL
HEMATOCRIT: 35 — AB (ref 36–46)
Hemoglobin: 11.5 — AB (ref 12.0–16.0)
NEUTROS ABS: 3
Platelets: 264 (ref 150–399)
WBC: 4.8

## 2017-04-18 LAB — HEPATIC FUNCTION PANEL
ALT: 11 (ref 7–35)
AST: 19 (ref 13–35)
Alkaline Phosphatase: 96 (ref 25–125)
Bilirubin, Total: 0.2

## 2017-04-18 LAB — TSH: TSH: 0.57 (ref 0.41–5.90)

## 2017-05-01 ENCOUNTER — Other Ambulatory Visit: Payer: Self-pay

## 2017-05-01 MED ORDER — HYDROCODONE-ACETAMINOPHEN 5-325 MG PO TABS: ORAL_TABLET | ORAL | 0 refills | Status: DC

## 2017-05-01 NOTE — Telephone Encounter (Signed)
RX faxed to [facility] @ [phone], phone number [phone] 

## 2017-05-03 ENCOUNTER — Emergency Department (HOSPITAL_COMMUNITY)
Admission: EM | Admit: 2017-05-03 | Discharge: 2017-05-04 | Disposition: A | Discharge status: Home / Self Care | Payer: Medicare HMO | Attending: Emergency Medicine | Admitting: Emergency Medicine

## 2017-05-03 ENCOUNTER — Encounter (HOSPITAL_COMMUNITY): Payer: Self-pay | Admitting: Emergency Medicine

## 2017-05-03 DIAGNOSIS — T1490XA Injury, unspecified, initial encounter: Secondary | ICD-10-CM | POA: Diagnosis not present

## 2017-05-03 DIAGNOSIS — Y929 Unspecified place or not applicable: Secondary | ICD-10-CM | POA: Diagnosis not present

## 2017-05-03 DIAGNOSIS — Z87891 Personal history of nicotine dependence: Secondary | ICD-10-CM | POA: Diagnosis not present

## 2017-05-03 DIAGNOSIS — I10 Essential (primary) hypertension: Secondary | ICD-10-CM | POA: Insufficient documentation

## 2017-05-03 DIAGNOSIS — Y939 Activity, unspecified: Secondary | ICD-10-CM | POA: Insufficient documentation

## 2017-05-03 DIAGNOSIS — W06XXXA Fall from bed, initial encounter: Secondary | ICD-10-CM | POA: Diagnosis not present

## 2017-05-03 DIAGNOSIS — I2510 Atherosclerotic heart disease of native coronary artery without angina pectoris: Secondary | ICD-10-CM | POA: Insufficient documentation

## 2017-05-03 DIAGNOSIS — F0390 Unspecified dementia without behavioral disturbance: Secondary | ICD-10-CM | POA: Diagnosis not present

## 2017-05-03 DIAGNOSIS — Y999 Unspecified external cause status: Secondary | ICD-10-CM | POA: Insufficient documentation

## 2017-05-03 DIAGNOSIS — Z79899 Other long term (current) drug therapy: Secondary | ICD-10-CM | POA: Insufficient documentation

## 2017-05-03 DIAGNOSIS — W19XXXA Unspecified fall, initial encounter: Secondary | ICD-10-CM

## 2017-05-03 DIAGNOSIS — R402411 Glasgow coma scale score 13-15, in the field [EMT or ambulance]: Secondary | ICD-10-CM | POA: Diagnosis not present

## 2017-05-03 DIAGNOSIS — Z043 Encounter for examination and observation following other accident: Secondary | ICD-10-CM | POA: Diagnosis present

## 2017-05-03 DIAGNOSIS — R69 Illness, unspecified: Secondary | ICD-10-CM | POA: Diagnosis not present

## 2017-05-03 NOTE — ED Notes (Signed)
Staff at the facility, [facility], was notified of pt's condition and discharge.

## 2017-05-03 NOTE — ED Notes (Signed)
[facility] was called for pt's transportation [facility].

## 2017-05-03 NOTE — ED Provider Notes (Signed)
[facility]-EMERGENCY DEPT Provider Note   CSN: [id number] Arrival date & time: 05/03/17  1924     History   Chief Complaint Chief Complaint  Patient presents with  . Fall   Triage Note 1934 Pt presents by EMS from [facility] for evaluation of a fall. EMS reported that pt rolled from bed onto mattress in floor. No obvious injury noted. Pt was placed in C-collar by EMS. Pt at  Baseline is confused and difficult to understand.      HPI Pamela Blackburn is a 81 y.o. female.  HPI  Remainder of history, ROS, and physical exam limited due to patient's condition (dementia). Additional information was obtained from EMS  Level V Caveat.   Past Medical History:  Diagnosis Date  . Anemia   . Anxiety 07/26/2012  . CAD (coronary artery disease) 07/26/2012   Cath 12/26/05: LAD 10-15% mid stenosis and distally, LCIRC small and patent, RCA 15-20% mid stenosis. EF 45%, LV with mild anterolateral and mid inferior wall hypokinesia  Myoview : 12/13/2009: No evidence of inducible reversible ischemia EF 77%  . Closed intertrochanteric fracture of right hip (HCC) 07/26/2012  . Coronary artery disease   . Dementia   . Dementia without behavioral disturbance 07/26/2012  . History of tobacco use 07/26/2012  . HTN (hypertension) 07/26/2012  . Hyperlipidemia 07/26/2012  . Hyperparathyroidism (HCC) 07/26/2012  . Hypertension   . Leukocytosis 07/26/2012  . Osteoporosis   . Pernicious anemia 07/26/2012  . Vitamin D deficiency 07/26/2012    Patient Active Problem List   Diagnosis Date Noted  . Failure to thrive in adult 03/18/2016  . Hypertriglyceridemia 10/31/2015  . Psychosis 10/25/2014  . Constipation 10/25/2014  . Intertrochanteric fracture of left femur (HCC) 07/19/2014  . Hypercalcemia 07/26/2012  . Pernicious anemia 07/26/2012  . Dementia without behavioral disturbance 07/26/2012  . Osteoporosis 07/26/2012  . HTN (hypertension) 07/26/2012  . CAD (coronary artery disease) 07/26/2012  . Anxiety 07/26/2012  .  Hyperparathyroidism (HCC) 07/26/2012  . Vitamin D deficiency 07/26/2012    Past Surgical History:  Procedure Laterality Date  . INTRAMEDULLARY (IM) NAIL INTERTROCHANTERIC Left 07/20/2014   Procedure: INTRAMEDULLARY (IM) NAIL INTERTROCHANTRIC;  Surgeon: Sheral Apley, MD;  Location: [facility] OR;  Service: Orthopedics;  Laterality: Left;  . TONSILLECTOMY      OB History    No data available       Home Medications    Prior to Admission medications   Medication Sig Start Date End Date Taking? Authorizing Provider  docusate sodium (COLACE) 100 MG capsule Take 1 capsule (100 mg total) by mouth 2 (two) times daily. Continue this while taking narcotics to help with bowel movements 07/20/14   Sheral Apley, MD  HYDROcodone-acetaminophen [facility]) 5-325 MG tablet Take one tablet by mouth twice daily for pain. Not to exceed 3000mg  of APAP from all sources/24h 05/01/17   Reed, Tiffany L, DO  LORazepam (ATIVAN) 0.5 MG tablet Take 0.5 mg by mouth at bedtime.    [provider]  metoprolol (LOPRESSOR) 50 MG tablet Take 50 mg by mouth daily. Notify MD/NP if BP consistently > 150/90 x 3 readings or < 100/60 or HR > 100 or < 50    [provider]  Multiple Vitamin (DAILY VITE) TABS Take 1 tablet by mouth daily.    [provider]  Nutritional Supplements (NUTRITIONAL SUPPLEMENT PO) Give House Supplement nutritional treat 4 oz three times daily by mouth with meals    [provider]  polyethylene glycol (MIRALAX /  GLYCOLAX) packet Take 17 g by mouth daily. 07/24/14   Christiane HaSullivan, Corinna L, MD  senna-docusate (SENOKOT-S) 8.6-50 MG per tablet Take 2 tablets by mouth at bedtime. 07/24/14   Christiane HaSullivan, Corinna L, MD  UNABLE TO FIND HSG Regular Diet - HSG Puree texture, Regular consistency, Large portions    [provider]  vitamin B-12 (CYANOCOBALAMIN) 1000 MCG tablet Take 1,000 mcg by mouth daily. For anemia    [provider]    Family History Family History    Problem Relation Age of Onset  . Breast cancer Mother   . Lung cancer Father     Social History Social History  Substance Use Topics  . Smoking status: Former Smoker    Packs/day: 0.50    Types: Cigarettes  . Smokeless tobacco: Never Used  . Alcohol use No     Allergies   Patient has no known allergies.   Review of Systems Review of Systems  Unable to perform ROS: Dementia  Endocrine: Positive for cold intolerance.     Physical Exam Updated Vital Signs BP (!) 151/88 (BP Location: Right Arm)   Pulse 94   Temp 98.2 F (36.8 C) (Oral)   Resp 18   Ht 5\' 1"  (1.549 m)   Wt 36.3 kg (80 lb)   SpO2 94%   BMI 15.12 kg/m   Physical Exam  Constitutional: She is oriented to person, place, and time. She appears well-developed and well-nourished. No distress.  HENT:  Head: Normocephalic and atraumatic.  Right Ear: External ear normal.  Left Ear: External ear normal.  Nose: Nose normal.  Eyes: Conjunctivae and EOM are normal. Pupils are equal, round, and reactive to light. Right eye exhibits no discharge. Left eye exhibits no discharge. No scleral icterus.  Neck: Normal range of motion. Neck supple.  Cardiovascular: Normal rate, regular rhythm and normal heart sounds.  Exam reveals no gallop and no friction rub.   No murmur heard. Pulses:      Radial pulses are 2+ on the right side, and 2+ on the left side.       Dorsalis pedis pulses are 2+ on the right side, and 2+ on the left side.  Pulmonary/Chest: Effort normal and breath sounds normal. No stridor. No respiratory distress. She has no wheezes.  Abdominal: Soft. She exhibits no distension. There is no tenderness.  Musculoskeletal: She exhibits no edema or tenderness.       Cervical back: She exhibits no bony tenderness.       Thoracic back: She exhibits no bony tenderness.       Lumbar back: She exhibits no bony tenderness.  Clavicles stable. Chest stable to AP/Lat compression. Pelvis stable to Lat compression. No  obvious extremity deformity. No chest or abdominal wall contusion.  Neurological: She is alert and oriented to person, place, and time.  Moving all extremities  Skin: Skin is warm and dry. No rash noted. She is not diaphoretic. No erythema.  Psychiatric: She has a normal mood and affect.     ED Treatments / Results  Labs (all labs ordered are listed, but only abnormal results are displayed) Labs Reviewed - No data to display  EKG  EKG Interpretation None       Radiology No results found.  Procedures Procedures (including critical care time)  Medications Ordered in ED Medications - No data to display   Initial Impression / Assessment and Plan / ED Course  I have reviewed the triage vital signs and the nursing notes.  Pertinent labs & imaging results that were available during my care of the patient were reviewed by me and considered in my medical decision making (see chart for details).     No external evidence of injuries on exam. Contacted the patient's daughter, and discussed our findings and lack of concern for serious internal injuries. Given the patient's age it would be reasonable to obtain a CT scan to assess for any intracranial, however after discussion with the family Investment banker, corporate decision-making), we opted to refrain from obtaining CT scan as the family which is not to pursue surgical intervention if a bleed were to be identified.  The patient is safe for discharge with strict return precautions.   Final Clinical Impressions(s) / ED Diagnoses   Final diagnoses:  Fall, initial encounter      Nira Conn, MD 05/03/17 2327

## 2017-05-03 NOTE — ED Triage Notes (Signed)
Pt presents by EMS from [facility] for evaluation of a fall. EMS reported that pt rolled from bed onto mattress in floor. No obvious injury noted. Pt was placed in C-collar by EMS. Pt at  Baseline is confused and difficult to understand.

## 2017-05-03 NOTE — ED Notes (Signed)
Bed: [id number] Expected date:  Expected time:  Means of arrival:  Comments: 81 yo F/ fall

## 2017-05-04 NOTE — ED Notes (Signed)
PTAR here to transport pt back to [facility].

## 2017-05-05 ENCOUNTER — Non-Acute Institutional Stay (SKILLED_NURSING_FACILITY): Payer: Medicare HMO | Admitting: Adult Health

## 2017-05-05 ENCOUNTER — Encounter: Payer: Self-pay | Admitting: Adult Health

## 2017-05-05 DIAGNOSIS — F0150 Vascular dementia without behavioral disturbance: Secondary | ICD-10-CM

## 2017-05-05 DIAGNOSIS — Y92129 Unspecified place in nursing home as the place of occurrence of the external cause: Secondary | ICD-10-CM | POA: Diagnosis not present

## 2017-05-05 DIAGNOSIS — W19XXXA Unspecified fall, initial encounter: Secondary | ICD-10-CM | POA: Diagnosis not present

## 2017-05-05 DIAGNOSIS — R69 Illness, unspecified: Secondary | ICD-10-CM | POA: Diagnosis not present

## 2017-05-05 DIAGNOSIS — G894 Chronic pain syndrome: Secondary | ICD-10-CM | POA: Diagnosis not present

## 2017-05-05 NOTE — Progress Notes (Signed)
Location:   [facility] Room Number: 219 B Place of Service:  SNF (31)   CODE STATUS: DNR  No Known Allergies  Chief Complaint  Patient presents with  . Acute Visit    S/P ED visit    HPI:  She had a fall and was taken to the ED for further evaluation and treatment options. There are no signs of injury present.  There is no bruising present. There were no changes from the ED. She is unable to participate in the hpi or ros.    Past Medical History:  Diagnosis Date  . Anemia   . Anxiety 07/26/2012  . CAD (coronary artery disease) 07/26/2012   Cath 12/26/05: LAD 10-15% mid stenosis and distally, LCIRC small and patent, RCA 15-20% mid stenosis. EF 45%, LV with mild anterolateral and mid inferior wall hypokinesia  Myoview : 12/13/2009: No evidence of inducible reversible ischemia EF 77%  . Closed intertrochanteric fracture of right hip (HCC) 07/26/2012  . Coronary artery disease   . Dementia   . Dementia without behavioral disturbance 07/26/2012  . History of tobacco use 07/26/2012  . HTN (hypertension) 07/26/2012  . Hyperlipidemia 07/26/2012  . Hyperparathyroidism (HCC) 07/26/2012  . Hypertension   . Leukocytosis 07/26/2012  . Osteoporosis   . Pernicious anemia 07/26/2012  . Vitamin D deficiency 07/26/2012    Past Surgical History:  Procedure Laterality Date  . INTRAMEDULLARY (IM) NAIL INTERTROCHANTERIC Left 07/20/2014   Procedure: INTRAMEDULLARY (IM) NAIL INTERTROCHANTRIC;  Surgeon: Sheral Apleyimothy D Murphy, MD;  Location: [facility] OR;  Service: Orthopedics;  Laterality: Left;  . TONSILLECTOMY      Social History   Social History  . Marital status: Legally Separated    Spouse name: N/A  . Number of children: N/A  . Years of education: N/A   Occupational History  . Not on file.   Social History Main Topics  . Smoking status: Former Smoker    Packs/day: 0.50    Types: Cigarettes  . Smokeless tobacco: Never Used  . Alcohol use No  . Drug use: No  . Sexual activity: Not on file    Other Topics Concern  . Not on file   Social History Narrative  . No narrative on file   Family History  Problem Relation Age of Onset  . Breast cancer Mother   . Lung cancer Father       VITAL SIGNS BP 140/70   Pulse (!) 54   Temp 97.7 F (36.5 C)   Resp 18   Ht 5\' 1"  (1.549 m)   Wt 79 lb 12.8 oz (36.2 kg)   SpO2 98%   BMI 15.08 kg/m   Patient's Medications  New Prescriptions   No medications on file  Previous Medications   DOCUSATE SODIUM (COLACE) 100 MG CAPSULE    Take 1 capsule (100 mg total) by mouth 2 (two) times daily. Continue this while taking narcotics to help with bowel movements   HYDROCODONE-ACETAMINOPHEN (NORCO) 5-325 MG TABLET    Take one tablet by mouth twice daily for pain. Not to exceed 3000mg  of APAP from all sources/24h   LORAZEPAM (ATIVAN) 0.5 MG TABLET    Take 0.5 mg by mouth at bedtime.   METOPROLOL (LOPRESSOR) 50 MG TABLET    Take 50 mg by mouth daily. Notify MD/NP if BP consistently > 150/90 x 3 readings or < 100/60 or HR > 100 or < 50   MULTIPLE VITAMIN (DAILY VITE) TABS    Take 1 tablet by  mouth daily.   NUTRITIONAL SUPPLEMENTS (NUTRITIONAL SUPPLEMENT PO)    Give House Supplement nutritional treat 4 oz three times daily by mouth with meals   POLYETHYLENE GLYCOL (MIRALAX / GLYCOLAX) PACKET    Take 17 g by mouth daily.   SENNA-DOCUSATE (SENOKOT-S) 8.6-50 MG PER TABLET    Take 2 tablets by mouth at bedtime.   UNABLE TO FIND    HSG Regular Diet - HSG Puree texture, Regular consistency, Large portions   VITAMIN B-12 (CYANOCOBALAMIN) 1000 MCG TABLET    Take 1,000 mcg by mouth daily. For anemia   VITAMIN D, ERGOCALCIFEROL, (DRISDOL) 50000 UNITS CAPS CAPSULE    Take 50,000 Units by mouth every 7 (seven) days. Give Every Friday x 6 weeks  Modified Medications   No medications on file  Discontinued Medications   No medications on file     SIGNIFICANT DIAGNOSTIC EXAMS  09-17-14: chest x-ray; rotated kyphotic projection hyperinflation with clear  lungs   04-25-15: chest x-ray: no acute cardiopulmonary process  11-07-15: chest x-ray; no acute cardiopulmonary disease process   LABS REVIEWED:    07-26-16: wbc 6.2; hgb 11.1; hct 35.3; mcv 85.2; plt 334; glucose 94; bun 15.0; creat 0.65; k+ 4.2; na++ 140; liver normal albumin 4.3; hgb a1c 5.4; PTH 137.300; chol 190; ldl 84; trig 215; hdl 63  1-61-09: wbc 5.2; hgb 10.5; hct 32.3; mcv 87.8; plt 253; glucose 95; bun 13; creat 0.70; k+ 4.3; na++ 142 ca 10.9; liver normal albumin 3.2; chol 161; ldl 93; trig 111; hdl 46; PTH 84.290  04-18-17: wbc 4.8; hgb 11.5; hct 35.4 mcv 87.8; plt 264; glucose 92; bun 12.1; creat 0.53; k+ 4.3; na++ 141; ca 10.8; liver normal albumin 3.8; tsh 0.57     Review of Systems Unable to perform ROS: Dementia    Physical Exam Constitutional: No distress.  Frail Teeth are rotted to the gum line    Eyes: Conjunctivae are normal.  Neck: Neck supple. No JVD present. No thyromegaly present.  Cardiovascular: Normal rate, regular rhythm and intact distal pulses.   Respiratory: Effort normal and breath sounds normal. No respiratory distress. She has no wheezes.  GI: Soft. Bowel sounds are normal. She exhibits no distension. There is no tenderness.  Musculoskeletal: She exhibits no edema.  Able to move all extremities   Lymphadenopathy:    She has no cervical adenopathy.  Neurological: She is alert.  Skin: Skin is warm and dry. She is not diaphoretic.  Psychiatric: She has a normal mood and affect.     ASSESSMENT/ PLAN:  1. Chronic pain due to  left hip fracture from August 2015:is on long term pain medication; vicodin 5/325 mg twice daily and will monitor   2. Dementia without behavioral disturbance: has declined in status; is presently not on medications; will not make changes will monitor  Her current weight is 79 pounds; her weight in Jan 2017 was 93 pounds.  Unfortunately weight loss is an expected out come in the late stages of dementia.   3. Status post  fall: no injury present; will continue to help ensure that she will not have an injury if she were to fall in the future.   MD is aware of resident's narcotic use and is in agreement with current plan of care. We will attempt to wean resident as apropriate     Synthia Innocent NP [facility]  Contact [phone] Monday through Friday 8am- 5pm  After hours call [phone]

## 2017-05-06 DIAGNOSIS — F419 Anxiety disorder, unspecified: Secondary | ICD-10-CM | POA: Diagnosis not present

## 2017-05-06 DIAGNOSIS — F39 Unspecified mood [affective] disorder: Secondary | ICD-10-CM | POA: Diagnosis not present

## 2017-05-06 DIAGNOSIS — R69 Illness, unspecified: Secondary | ICD-10-CM | POA: Diagnosis not present

## 2017-05-06 DIAGNOSIS — F29 Unspecified psychosis not due to a substance or known physiological condition: Secondary | ICD-10-CM | POA: Diagnosis not present

## 2017-05-14 NOTE — Progress Notes (Signed)
ERROR

## 2017-05-18 ENCOUNTER — Non-Acute Institutional Stay (SKILLED_NURSING_FACILITY): Payer: Medicare HMO

## 2017-05-18 DIAGNOSIS — Z0000 Encounter for general adult medical examination without abnormal findings: Secondary | ICD-10-CM | POA: Diagnosis not present

## 2017-05-18 NOTE — Patient Instructions (Signed)
Pamela Blackburn , Thank you for taking time to come for your Medicare Wellness Visit. I appreciate your ongoing commitment to your health goals. Please review the following plan we discussed and let me know if I can assist you in the future.   Screening recommendations/referrals: Colonoscopy up to date, long term pt Mammogram up to date, long term pt Bone Density up to date Recommended yearly ophthalmology/optometry visit for glaucoma screening and checkup 81 Recommended yearly dental visit for hygiene and checkup  Vaccinations: Influenza vaccine due 08/31/2017 Pneumococcal vaccine up to date Tdap vaccine due Shingles vaccine not in records    Advanced directives: In chart  Conditions/risks identified: None  Next appointment: Dr. Montez Moritaarter makes rounds   Preventive Care 79 Years and Older, Female Preventive care refers to lifestyle choices and visits with your health care provider that can promote health and wellness. 81 What does preventive care include?  A yearly physical exam. This is also called an annual well check.  Dental exams once or twice a year.  Routine eye exams. Ask your health care provider how often you should have your eyes checked.  Personal lifestyle choices, including:  Daily care of your teeth and gums.  Regular physical activity.  Eating a healthy diet.  Avoiding tobacco and drug use.  Limiting alcohol use.  Practicing safe sex.  Taking low-dose aspirin every day.  Taking vitamin and mineral supplements as recommended by your health care provider. What happens during an annual well check? The services and screenings done by your health care provider during your annual well check will depend on your age, overall health, lifestyle risk factors, and family history of disease. Counseling  Your health care provider may ask you questions about your:  Alcohol use.  Tobacco use.  Drug use.  Emotional well-being.  Home and relationship  well-being.  Sexual activity.  Eating habits.  History of falls.  Memory and ability to understand (cognition).  Work and work Astronomerenvironment.  Reproductive health. Screening  You may have the following tests or measurements:  Height, weight, and BMI.  Blood pressure.  Lipid and cholesterol levels. These may be checked every 5 years, or more frequently if you are over 79 years old.  Skin check.  Lung cancer screening. You may have this screening every year starting at age 79 if you have a 30-pack-year history of smoking and currently smoke or have quit within the past 15 years.  Fecal occult blood test (FOBT) of the stool. You may have this test every year starting at age 79.  Flexible sigmoidoscopy or colonoscopy. You may have a sigmoidoscopy every 5 years or a colonoscopy every 10 years starting at age 79.  Hepatitis C blood test.  Hepatitis B blood test.  Sexually transmitted disease (STD) testing.  Diabetes screening. This is done by checking your blood sugar (glucose) after you have not eaten for a while (fasting). You may have this done every 1-3 years.  Bone density scan. This is done to screen for osteoporosis. You may have this done starting at age 79.  Mammogram. This may be done every 1-2 years. Talk to your health care provider about how often you should have regular mammograms. Talk with your health care provider about your test results, treatment options, and if necessary, the need for more tests. Vaccines  Your health care provider may recommend certain vaccines, such as:  Influenza vaccine. This is recommended every year.  Tetanus, diphtheria, and acellular pertussis (Tdap, Td) vaccine. You may need a Td  booster every 10 years.  Zoster vaccine. You may need this after age 55.  Pneumococcal 13-valent conjugate (PCV13) vaccine. One dose is recommended after age 79.  Pneumococcal polysaccharide (PPSV23) vaccine. One dose is recommended after age  79. Talk to your health care provider about which screenings and vaccines you need and how often you need them. This information is not intended to replace advice given to you by your health care provider. Make sure you discuss any questions you have with your health care provider. Document Released: 12/07/2015 Document Revised: 07/30/2016 Document Reviewed: 09/11/2015 Elsevier Interactive Patient Education  2017 [place] Prevention in the Home Falls can cause injuries. They can happen to people of all ages. There are many things you can do to make your home safe and to help prevent falls. What can I do on the outside of my home?  Regularly fix the edges of walkways and driveways and fix any cracks.  Remove anything that might make you trip as you walk through a door, such as a raised step or threshold.  Trim any bushes or trees on the path to your home.  Use bright outdoor lighting.  Clear any walking paths of anything that might make someone trip, such as rocks or tools.  Regularly check to see if handrails are loose or broken. Make sure that both sides of any steps have handrails.  Any raised decks and porches should have guardrails on the edges.  Have any leaves, snow, or ice cleared regularly.  Use sand or salt on walking paths during winter.  Clean up any spills in your garage right away. This includes oil or grease spills. What can I do in the bathroom?  Use night lights.  Install grab bars by the toilet and in the tub and shower. Do not use towel bars as grab bars.  Use non-skid mats or decals in the tub or shower.  If you need to sit down in the shower, use a plastic, non-slip stool.  Keep the floor dry. Clean up any water that spills on the floor as soon as it happens.  Remove soap buildup in the tub or shower regularly.  Attach bath mats securely with double-sided non-slip rug tape.  Do not have throw rugs and other things on the floor that can make  you trip. What can I do in the bedroom?  Use night lights.  Make sure that you have a light by your bed that is easy to reach.  Do not use any sheets or blankets that are too big for your bed. They should not hang down onto the floor.  Have a firm chair that has side arms. You can use this for support while you get dressed.  Do not have throw rugs and other things on the floor that can make you trip. What can I do in the kitchen?  Clean up any spills right away.  Avoid walking on wet floors.  Keep items that you use a lot in easy-to-reach places.  If you need to reach something above you, use a strong step stool that has a grab bar.  Keep electrical cords out of the way.  Do not use floor polish or wax that makes floors slippery. If you must use wax, use non-skid floor wax.  Do not have throw rugs and other things on the floor that can make you trip. What can I do with my stairs?  Do not leave any items on the stairs.  Make sure that there are handrails on both sides of the stairs and use them. Fix handrails that are broken or loose. Make sure that handrails are as long as the stairways.  Check any carpeting to make sure that it is firmly attached to the stairs. Fix any carpet that is loose or worn.  Avoid having throw rugs at the top or bottom of the stairs. If you do have throw rugs, attach them to the floor with carpet tape.  Make sure that you have a light switch at the top of the stairs and the bottom of the stairs. If you do not have them, ask someone to add them for you. What else can I do to help prevent falls?  Wear shoes that:  Do not have high heels.  Have rubber bottoms.  Are comfortable and fit you well.  Are closed at the toe. Do not wear sandals.  If you use a stepladder:  Make sure that it is fully opened. Do not climb a closed stepladder.  Make sure that both sides of the stepladder are locked into place.  Ask someone to hold it for you, if  possible.  Clearly mark and make sure that you can see:  Any grab bars or handrails.  First and last steps.  Where the edge of each step is.  Use tools that help you move around (mobility aids) if they are needed. These include:  Canes.  Walkers.  Scooters.  Crutches.  Turn on the lights when you go into a dark area. Replace any light bulbs as soon as they burn out.  Set up your furniture so you have a clear path. Avoid moving your furniture around.  If any of your floors are uneven, fix them.  If there are any pets around you, be aware of where they are.  Review your medicines with your doctor. Some medicines can make you feel dizzy. This can increase your chance of falling. Ask your doctor what other things that you can do to help prevent falls. This information is not intended to replace advice given to you by your health care provider. Make sure you discuss any questions you have with your health care provider. Document Released: 09/06/2009 Document Revised: 04/17/2016 Document Reviewed: 12/15/2014 Elsevier Interactive Patient Education  2017 [employer].

## 2017-05-18 NOTE — Progress Notes (Signed)
Subjective:   Pamela BashLinda B Blackburn is a 81 y.o. female who presents for an Initial Medicare Annual Wellness Visit at [facility]; incapacitated patient unable to answer questions appropriately.        Objective:    Today's Vitals   05/18/17 1413  BP: 130/65  Pulse: 60  Temp: 97.8 F (36.6 C)  TempSrc: Oral  SpO2: 96%  Weight: 80 lb (36.3 kg)  Height: 5\' 1"  (1.549 m)   Body mass index is 15.12 kg/m.   Current Medications (verified) Outpatient Encounter Prescriptions as of 05/18/2017  Medication Sig  . docusate sodium (COLACE) 100 MG capsule Take 1 capsule (100 mg total) by mouth 2 (two) times daily. Continue this while taking narcotics to help with bowel movements  . HYDROcodone-acetaminophen (NORCO) 5-325 MG tablet Take one tablet by mouth twice daily for pain. Not to exceed 3000mg  of APAP from all sources/24h  . LORazepam (ATIVAN) 0.5 MG tablet Take 0.5 mg by mouth at bedtime.  . metoprolol (LOPRESSOR) 50 MG tablet Take 50 mg by mouth daily. Notify MD/NP if BP consistently > 150/90 x 3 readings or < 100/60 or HR > 100 or < 50  . Multiple Vitamin (DAILY VITE) TABS Take 1 tablet by mouth daily.  . Nutritional Supplements (NUTRITIONAL SUPPLEMENT PO) Give House Supplement nutritional treat 4 oz three times daily by mouth with meals  . polyethylene glycol (MIRALAX / GLYCOLAX) packet Take 17 g by mouth daily.  Marland Kitchen. senna-docusate (SENOKOT-S) 8.6-50 MG per tablet Take 2 tablets by mouth at bedtime.  Marland Kitchen. UNABLE TO FIND HSG Regular Diet - HSG Puree texture, Regular consistency, Large portions  . vitamin B-12 (CYANOCOBALAMIN) 1000 MCG tablet Take 1,000 mcg by mouth daily. For anemia  . Vitamin D, Ergocalciferol, (DRISDOL) 50000 units CAPS capsule Take 50,000 Units by mouth every 7 (seven) days. Give Every Friday x 6 weeks   No facility-administered encounter medications on file as of 05/18/2017.     Allergies (verified) Patient has no known allergies.   History: Past Medical  History:  Diagnosis Date  . Anemia   . Anxiety 07/26/2012  . CAD (coronary artery disease) 07/26/2012   Cath 12/26/05: LAD 10-15% mid stenosis and distally, LCIRC small and patent, RCA 15-20% mid stenosis. EF 45%, LV with mild anterolateral and mid inferior wall hypokinesia  Myoview : 12/13/2009: No evidence of inducible reversible ischemia EF 77%  . Closed intertrochanteric fracture of right hip (HCC) 07/26/2012  . Coronary artery disease   . Dementia   . Dementia without behavioral disturbance 07/26/2012  . History of tobacco use 07/26/2012  . HTN (hypertension) 07/26/2012  . Hyperlipidemia 07/26/2012  . Hyperparathyroidism (HCC) 07/26/2012  . Hypertension   . Leukocytosis 07/26/2012  . Osteoporosis   . Pernicious anemia 07/26/2012  . Vitamin D deficiency 07/26/2012   Past Surgical History:  Procedure Laterality Date  . INTRAMEDULLARY (IM) NAIL INTERTROCHANTERIC Left 07/20/2014   Procedure: INTRAMEDULLARY (IM) NAIL INTERTROCHANTRIC;  Surgeon: Sheral Apleyimothy D Murphy, MD;  Location: [facility] OR;  Service: Orthopedics;  Laterality: Left;  . TONSILLECTOMY     Family History  Problem Relation Age of Onset  . Breast cancer Mother   . Lung cancer Father    Social History   Occupational History  . Not on file.   Social History Main Topics  . Smoking status: Former Smoker    Packs/day: 0.50    Types: Cigarettes  . Smokeless tobacco: Never Used  . Alcohol use No  . Drug use:  No  . Sexual activity: Not on file    Tobacco Counseling Counseling given: Not Answered   Activities of Daily Living In your present state of health, do you have any difficulty performing the following activities: 05/18/2017  Hearing? Y  Vision? Y  Difficulty concentrating or making decisions? Y  Walking or climbing stairs? Y  Dressing or bathing? Y  Doing errands, shopping? Y  Preparing Food and eating ? Y  Using the Toilet? Y  In the past six months, have you accidently leaked urine? Y  Do you have problems with loss of bowel  control? Y  Managing your Medications? Y  Managing your Finances? Y  Housekeeping or managing your Housekeeping? Y  Some recent data might be hidden    Immunizations and Health Maintenance Immunization History  Administered Date(s) Administered  . Influenza-Unspecified 08/09/2015, 08/31/2016  . PPD Test 08/08/2016, 08/15/2016  . Pneumococcal Polysaccharide-23 08/02/2012  . Pneumococcal-Unspecified 01/22/2015   There are no preventive care reminders to display for this patient.  Patient Care Team: Kirt Boys, DO as PCP - General (Internal Medicine) Chilton Si Chong Sicilian, NP as Nurse Practitioner (Geriatric Medicine) [facility]  Indicate any recent Medical Services you may have received from other than [facility] providers in the past year (date may be approximate).     Assessment:   This is a routine wellness examination for [place].   Hearing/Vision screen No exam data present  Dietary issues and exercise activities discussed: Current Exercise Habits: The patient does not participate in regular exercise at present, Exercise limited by: neurologic condition(s)  Goals    None     Depression Screen [facility] 2/9 Scores 05/18/2017 10/02/2015  Exception Documentation Medical reason Other- indicate reason in comment box  Not completed - patient unable to participate     Fall Risk Fall Risk  05/18/2017 10/02/2015  Falls in the past year? Exclusion - non ambulatory No    Cognitive Function: MMSE - Mini Mental State Exam 05/18/2017  Not completed: Unable to complete        Screening Tests Health Maintenance  Topic Date Due  . PNA vac Low Risk Adult (2 of 2 - PCV13) 02/13/2018 (Originally 01/22/2016)  . INFLUENZA VACCINE  06/24/2017  . DEXA SCAN  Completed      Plan:    I have personally reviewed and addressed the Medicare Annual Wellness questionnaire and have noted the following in the patient's chart:  A. Medical and social  history B. Use of alcohol, tobacco or illicit drugs  C. Current medications and supplements D. Functional ability and status E.  Nutritional status F.  Physical activity G. Advance directives H. List of other physicians I.  Hospitalizations, surgeries, and ER visits in previous 12 months J.  Vitals K. Screenings to include hearing, vision, cognitive, depression L. Referrals and appointments - none  In addition, I have reviewed and discussed with patient certain preventive protocols, quality metrics, and best practice recommendations. A written personalized care plan for preventive services as well as general preventive health recommendations were provided to patient.  See attached scanned questionnaire for additional information.   Signed,   Annetta Maw, RN Nurse Health Advisor   Quick Notes   Health Maintenance:TDAP due     Abnormal Screen: unable to complete mini mental     Patient Concerns: none     Nurse Concerns: none

## 2017-05-22 ENCOUNTER — Non-Acute Institutional Stay (SKILLED_NURSING_FACILITY): Payer: Medicare HMO | Admitting: Adult Health

## 2017-05-22 ENCOUNTER — Encounter: Payer: Self-pay | Admitting: Adult Health

## 2017-05-22 DIAGNOSIS — R69 Illness, unspecified: Secondary | ICD-10-CM | POA: Diagnosis not present

## 2017-05-22 DIAGNOSIS — R627 Adult failure to thrive: Secondary | ICD-10-CM | POA: Diagnosis not present

## 2017-05-22 DIAGNOSIS — S72145S Nondisplaced intertrochanteric fracture of left femur, sequela: Secondary | ICD-10-CM | POA: Diagnosis not present

## 2017-05-22 DIAGNOSIS — I10 Essential (primary) hypertension: Secondary | ICD-10-CM | POA: Diagnosis not present

## 2017-05-22 DIAGNOSIS — E213 Hyperparathyroidism, unspecified: Secondary | ICD-10-CM | POA: Diagnosis not present

## 2017-05-22 DIAGNOSIS — K5901 Slow transit constipation: Secondary | ICD-10-CM | POA: Diagnosis not present

## 2017-05-22 DIAGNOSIS — F0150 Vascular dementia without behavioral disturbance: Secondary | ICD-10-CM

## 2017-05-22 DIAGNOSIS — G894 Chronic pain syndrome: Secondary | ICD-10-CM

## 2017-05-22 NOTE — Progress Notes (Signed)
Location:   [facility] Room Number: 219 B Place of Service:  SNF (31)   CODE STATUS: DNR  No Known Allergies  Chief Complaint  Patient presents with  . Medical Management of Chronic Issues    1 month follow up    HPI:  She is a 81 year old resident of this facility being seen for the management of her chronic illnesses. She does continue to slowly decline. She is losing weight. She does spend nearly all of her time in bed. She is unable to participate in the hpi or ros. There are no nursing concerns at this time.    Past Medical History:  Diagnosis Date  . Anemia   . Anxiety 07/26/2012  . CAD (coronary artery disease) 07/26/2012   Cath 12/26/05: LAD 10-15% mid stenosis and distally, LCIRC small and patent, RCA 15-20% mid stenosis. EF 45%, LV with mild anterolateral and mid inferior wall hypokinesia  Myoview : 12/13/2009: No evidence of inducible reversible ischemia EF 77%  . Closed intertrochanteric fracture of right hip (HCC) 07/26/2012  . Coronary artery disease   . Dementia   . Dementia without behavioral disturbance 07/26/2012  . History of tobacco use 07/26/2012  . HTN (hypertension) 07/26/2012  . Hyperlipidemia 07/26/2012  . Hyperparathyroidism (HCC) 07/26/2012  . Hypertension   . Leukocytosis 07/26/2012  . Osteoporosis   . Pernicious anemia 07/26/2012  . Vitamin D deficiency 07/26/2012    Past Surgical History:  Procedure Laterality Date  . INTRAMEDULLARY (IM) NAIL INTERTROCHANTERIC Left 07/20/2014   Procedure: INTRAMEDULLARY (IM) NAIL INTERTROCHANTRIC;  Surgeon: Sheral Apley, MD;  Location: [facility] OR;  Service: Orthopedics;  Laterality: Left;  . TONSILLECTOMY      Social History   Social History  . Marital status: Legally Separated    Spouse name: N/A  . Number of children: N/A  . Years of education: N/A   Occupational History  . Not on file.   Social History Main Topics  . Smoking status: Former Smoker    Packs/day: 0.50    Types: Cigarettes  . Smokeless  tobacco: Never Used  . Alcohol use No  . Drug use: No  . Sexual activity: Not on file   Other Topics Concern  . Not on file   Social History Narrative  . No narrative on file   Family History  Problem Relation Age of Onset  . Breast cancer Mother   . Lung cancer Father       VITAL SIGNS BP 138/87   Pulse 88   Temp 97.5 F (36.4 C)   Ht 5\' 1"  (1.549 m)   Wt 79 lb 12.8 oz (36.2 kg)   BMI 15.08 kg/m   Patient's Medications  New Prescriptions   No medications on file  Previous Medications   DOCUSATE SODIUM (COLACE) 100 MG CAPSULE    Take 1 capsule (100 mg total) by mouth 2 (two) times daily. Continue this while taking narcotics to help with bowel movements   HYDROCODONE-ACETAMINOPHEN (NORCO) 5-325 MG TABLET    Take one tablet by mouth twice daily for pain. Not to exceed 3000mg  of APAP from all sources/24h   LORAZEPAM (ATIVAN) 0.5 MG TABLET    Take 0.5 mg by mouth at bedtime.   METOPROLOL (LOPRESSOR) 50 MG TABLET    Take 50 mg by mouth daily. Notify MD/NP if BP consistently > 150/90 x 3 readings or < 100/60 or HR > 100 or < 50   MULTIPLE VITAMIN (DAILY VITE) TABS  Take 1 tablet by mouth daily.   NUTRITIONAL SUPPLEMENTS (NUTRITIONAL SUPPLEMENT PO)    Give House Supplement nutritional treat 4 oz three times daily by mouth with meals   NUTRITIONAL SUPPLEMENTS PO    House Shakes - Give 4 oz with daily with meals   POLYETHYLENE GLYCOL (MIRALAX / GLYCOLAX) PACKET    Take 17 g by mouth daily.   SENNA-DOCUSATE (SENOKOT-S) 8.6-50 MG PER TABLET    Take 2 tablets by mouth at bedtime.   UNABLE TO FIND    HSG Regular Diet - HSG Puree texture, Regular consistency, Large portions   VITAMIN B-12 (CYANOCOBALAMIN) 1000 MCG TABLET    Take 1,000 mcg by mouth daily. For anemia   VITAMIN D, ERGOCALCIFEROL, (DRISDOL) 50000 UNITS CAPS CAPSULE    Take 50,000 Units by mouth every 7 (seven) days. Give Every Friday x 6 weeks  Modified Medications   No medications on file  Discontinued Medications    No medications on file     SIGNIFICANT DIAGNOSTIC EXAMS   09-17-14: chest x-ray; rotated kyphotic projection hyperinflation with clear lungs   04-25-15: chest x-ray: no acute cardiopulmonary process  11-07-15: chest x-ray; no acute cardiopulmonary disease process   LABS REVIEWED:    07-26-16: wbc 6.2; hgb 11.1; hct 35.3; mcv 85.2; plt 334; glucose 94; bun 15.0; creat 0.65; k+ 4.2; na++ 140; liver normal albumin 4.3; hgb a1c 5.4; PTH 137.300; chol 190; ldl 84; trig 215; hdl 63  1-61-09: wbc 5.2; hgb 10.5; hct 32.3; mcv 87.8; plt 253; glucose 95; bun 13; creat 0.70; k+ 4.3; na++ 142 ca 10.9; liver normal albumin 3.2; chol 161; ldl 93; trig 111; hdl 46; PTH 84.290  04-18-17: wbc 4.8; hgb 11.5; hct 35.4 mcv 87.8; plt 264; glucose 92; bun 12.1; creat 0.53; k+ 4.3; na++ 141; ca 10.8; liver normal albumin 3.8; tsh 0.57     Review of Systems Unable to perform ROS: Dementia    Physical Exam Constitutional: No distress.  Frail Teeth are rotted to the gum line    Eyes: Conjunctivae are normal.  Neck: Neck supple. No JVD present. No thyromegaly present.  Cardiovascular: Normal rate, regular rhythm and intact distal pulses.   Respiratory: Effort normal and breath sounds normal. No respiratory distress. She has no wheezes.  GI: Soft. Bowel sounds are normal. She exhibits no distension. There is no tenderness.  Musculoskeletal: She exhibits no edema.  Able to move all extremities   Lymphadenopathy:    She has no cervical adenopathy.  Neurological: She is alert.  Skin: Skin is warm and dry. She is not diaphoretic.  Psychiatric: She has a normal mood and affect.     ASSESSMENT/ PLAN:  1. Hypertension: 137/87 has CAD: will continue lopressor 50 mg daily; will monitor  2. Constipation; will continue  miralax daily; and senna s 2 tabs nightly colace twice daily   3. Anemia: is on vitamin B 12:   her hgb is 10.5  4. Chronic pain due to  left hip fracture from August 2015:is on long term  pain medication; vicodin 5/325 mg twice daily and will monitor   5. Psychosis: does talk to herself; is unable answer questions; no recent reports of behavioral issues present; will continue ativan  0.5 mg in th PM   for anxiety and will monitor her status.    6. Dementia without behavioral disturbance: has declined in status; is presently not on medications; will not make changes will monitor  Her current weight is 79 pounds; her  weight in Jan 2017 was 93 pounds.  Unfortunately weight loss is an expected out come in the late stages of dementia.   7. Hypercalcemia: her calcium level is 10.9 . Her goals of care is comfort with her no hospitalization and dnr status.   8.  Hyperparathyroidism: Her PTH: is 84.290 She is not on medications; will not make changes will continue to monitor her status.   9. Hypertriglyceridemia: trig 111; her fenofibrate was stopped due to weight loss    10. FTT:Her current weight is 79  pounds; her weight in Jan 2017 was 93 pounds.  Will continue supplements per facility protocol; remeron was not effective in her gaining weight.     MD is aware of resident's narcotic use and is in agreement with current plan of care. We will attempt to wean resident as apropriate   Synthia Innocenteborah Green NP [facility]  Contact [phone] Monday through Friday 8am- 5pm  After hours call [phone]

## 2017-05-27 DIAGNOSIS — G8929 Other chronic pain: Secondary | ICD-10-CM | POA: Insufficient documentation

## 2017-05-27 DIAGNOSIS — W19XXXA Unspecified fall, initial encounter: Secondary | ICD-10-CM | POA: Insufficient documentation

## 2017-05-27 DIAGNOSIS — Y92129 Unspecified place in nursing home as the place of occurrence of the external cause: Secondary | ICD-10-CM

## 2017-06-03 DIAGNOSIS — R1310 Dysphagia, unspecified: Secondary | ICD-10-CM | POA: Diagnosis not present

## 2017-06-03 DIAGNOSIS — E785 Hyperlipidemia, unspecified: Secondary | ICD-10-CM | POA: Diagnosis not present

## 2017-06-03 DIAGNOSIS — E46 Unspecified protein-calorie malnutrition: Secondary | ICD-10-CM | POA: Diagnosis not present

## 2017-06-03 DIAGNOSIS — I25810 Atherosclerosis of coronary artery bypass graft(s) without angina pectoris: Secondary | ICD-10-CM | POA: Diagnosis not present

## 2017-06-03 DIAGNOSIS — G309 Alzheimer's disease, unspecified: Secondary | ICD-10-CM | POA: Diagnosis not present

## 2017-06-03 DIAGNOSIS — I10 Essential (primary) hypertension: Secondary | ICD-10-CM | POA: Diagnosis not present

## 2017-06-03 DIAGNOSIS — R69 Illness, unspecified: Secondary | ICD-10-CM | POA: Diagnosis not present

## 2017-06-03 DIAGNOSIS — I739 Peripheral vascular disease, unspecified: Secondary | ICD-10-CM | POA: Diagnosis not present

## 2017-06-09 DIAGNOSIS — L89153 Pressure ulcer of sacral region, stage 3: Secondary | ICD-10-CM | POA: Diagnosis not present

## 2017-06-10 DIAGNOSIS — R69 Illness, unspecified: Secondary | ICD-10-CM | POA: Diagnosis not present

## 2017-06-16 DIAGNOSIS — L89312 Pressure ulcer of right buttock, stage 2: Secondary | ICD-10-CM | POA: Diagnosis not present

## 2017-06-23 DIAGNOSIS — L89153 Pressure ulcer of sacral region, stage 3: Secondary | ICD-10-CM | POA: Diagnosis not present

## 2017-06-23 DIAGNOSIS — L89620 Pressure ulcer of left heel, unstageable: Secondary | ICD-10-CM | POA: Diagnosis not present

## 2017-06-25 ENCOUNTER — Non-Acute Institutional Stay (SKILLED_NURSING_FACILITY): Payer: Medicare Other | Admitting: Adult Health

## 2017-06-25 ENCOUNTER — Encounter: Payer: Self-pay | Admitting: Adult Health

## 2017-06-25 DIAGNOSIS — K5901 Slow transit constipation: Secondary | ICD-10-CM | POA: Diagnosis not present

## 2017-06-25 DIAGNOSIS — G894 Chronic pain syndrome: Secondary | ICD-10-CM

## 2017-06-25 DIAGNOSIS — I10 Essential (primary) hypertension: Secondary | ICD-10-CM

## 2017-06-25 DIAGNOSIS — D510 Vitamin B12 deficiency anemia due to intrinsic factor deficiency: Secondary | ICD-10-CM

## 2017-06-25 NOTE — Progress Notes (Signed)
Location:   [facility] Room Number: 219 B Place of Service:  SNF (31)   CODE STATUS: DNR  No Known Allergies  Chief Complaint  Patient presents with  . Medical Management of Chronic Issues    1 month follow up    HPI:  She is a 81 year old long term resident of this facility being seen for the management of her chronic illnesses: hypertension; constipation; anemia; chronic pain. . She is followed by hospice care. She continues to slowly decline. She is frail; rarely gets out of bed. She is unable to participate in the hpi or ros. There are no nursing concerns at this time.   Past Medical History:  Diagnosis Date  . Anemia   . Anxiety 07/26/2012  . CAD (coronary artery disease) 07/26/2012   Cath 12/26/05: LAD 10-15% mid stenosis and distally, LCIRC small and patent, RCA 15-20% mid stenosis. EF 45%, LV with mild anterolateral and mid inferior wall hypokinesia  Myoview : 12/13/2009: No evidence of inducible reversible ischemia EF 77%  . Closed intertrochanteric fracture of right hip (HCC) 07/26/2012  . Coronary artery disease   . Dementia   . Dementia without behavioral disturbance 07/26/2012  . History of tobacco use 07/26/2012  . HTN (hypertension) 07/26/2012  . Hyperlipidemia 07/26/2012  . Hyperparathyroidism (HCC) 07/26/2012  . Hypertension   . Leukocytosis 07/26/2012  . Osteoporosis   . Pernicious anemia 07/26/2012  . Vitamin D deficiency 07/26/2012    Past Surgical History:  Procedure Laterality Date  . INTRAMEDULLARY (IM) NAIL INTERTROCHANTERIC Left 07/20/2014   Procedure: INTRAMEDULLARY (IM) NAIL INTERTROCHANTRIC;  Surgeon: Sheral Apleyimothy D Murphy, MD;  Location: [facility] OR;  Service: Orthopedics;  Laterality: Left;  . TONSILLECTOMY      Social History   Social History  . Marital status: Legally Separated    Spouse name: N/A  . Number of children: N/A  . Years of education: N/A   Occupational History  . Not on file.   Social History Main Topics  . Smoking status: Former Smoker     Packs/day: 0.50    Types: Cigarettes  . Smokeless tobacco: Never Used  . Alcohol use No  . Drug use: No  . Sexual activity: Not on file   Other Topics Concern  . Not on file   Social History Narrative  . No narrative on file   Family History  Problem Relation Age of Onset  . Breast cancer Mother   . Lung cancer Father       VITAL SIGNS BP (!) 102/52   Pulse 96   Temp 98.1 F (36.7 C)   Resp 17   Ht 5\' 1"  (1.549 m)   Wt 80 lb 1.6 oz (36.3 kg)   SpO2 97%   BMI 15.13 kg/m   Patient's Medications  New Prescriptions   No medications on file  Previous Medications   CHOLECALCIFEROL (VITAMIN D) 1000 UNITS TABLET    Give 2 tablets (2000 unit) by mouth every morining   DOCUSATE SODIUM (COLACE) 100 MG CAPSULE    Take 1 capsule (100 mg total) by mouth 2 (two) times daily. Continue this while taking narcotics to help with bowel movements   HYDROCODONE-ACETAMINOPHEN (NORCO) 5-325 MG TABLET    Take one tablet by mouth twice daily for pain. Not to exceed 3000mg  of APAP from all sources/24h   LORAZEPAM (ATIVAN) 0.5 MG TABLET    Take 0.5 mg by mouth at bedtime.   METOPROLOL TARTRATE (LOPRESSOR) 25 MG TABLET  Take 25 mg by mouth daily. Hold for BP <100/50; HR <50   MULTIPLE VITAMINS-MINERALS (DECUBI-VITE) CAPS    Take 1 capsule by mouth daily.   NUTRITIONAL SUPPLEMENTS (NUTRITIONAL SUPPLEMENT PO)    Give House Supplement nutritional treat 4 oz three times daily by mouth with meals   NUTRITIONAL SUPPLEMENTS (NUTRITIONAL SUPPLEMENT PO)    Med Pass - Give 120 ml three times daily   NUTRITIONAL SUPPLEMENTS PO    House Shakes - Give 4 oz with daily with meals   POLYETHYLENE GLYCOL (MIRALAX / GLYCOLAX) PACKET    Take 17 g by mouth daily.   SENNA-DOCUSATE (SENOKOT-S) 8.6-50 MG PER TABLET    Take 2 tablets by mouth at bedtime.   UNABLE TO FIND    HSG Regular Diet - HSG Puree texture, Regular consistency, Large portions   VITAMIN B-12 (CYANOCOBALAMIN) 1000 MCG TABLET    Take 1,000 mcg by  mouth daily. For anemia  Modified Medications   No medications on file  Discontinued Medications   MULTIPLE VITAMIN (DAILY VITE) TABS    Take 1 tablet by mouth daily.   VITAMIN D, ERGOCALCIFEROL, (DRISDOL) 50000 UNITS CAPS CAPSULE    Take 50,000 Units by mouth every 7 (seven) days. Give Every Friday x 6 weeks   WOUND DRESSINGS GEL    MediHoney - Apply to Right superior buttock and sacrum topically every day shift for wound care     SIGNIFICANT DIAGNOSTIC EXAMS  PREVIOUS   09-17-14: chest x-ray; rotated kyphotic projection hyperinflation with clear lungs   04-25-15: chest x-ray: no acute cardiopulmonary process  11-07-15: chest x-ray; no acute cardiopulmonary disease process  NO NEW EXAMS   LABS REVIEWED: PREVIOUS     07-26-16: wbc 6.2; hgb 11.1; hct 35.3; mcv 85.2; plt 334; glucose 94; bun 15.0; creat 0.65; k+ 4.2; na++ 140; liver normal albumin 4.3; hgb a1c 5.4; PTH 137.300; chol 190; ldl 84; trig 215; hdl 63  1-61-09: wbc 5.2; hgb 10.5; hct 32.3; mcv 87.8; plt 253; glucose 95; bun 13; creat 0.70; k+ 4.3; na++ 142 ca 10.9; liver normal albumin 3.2; chol 161; ldl 93; trig 111; hdl 46; PTH 84.290  04-18-17: wbc 4.8; hgb 11.5; hct 35.4 mcv 87.8; plt 264; glucose 92; bun 12.1; creat 0.53; k+ 4.3; na++ 141; ca 10.8; liver normal albumin 3.8; tsh 0.57   NO NEW LABS    Review of Systems  Unable to perform ROS: Dementia (is nonverbal )   Physical Exam  Constitutional: No distress.  Frail has rotted teeth.   Eyes: Conjunctivae are normal.  Neck: Neck supple. No JVD present. No thyromegaly present.  Cardiovascular: Normal rate, regular rhythm and intact distal pulses.   Respiratory: Effort normal and breath sounds normal. No respiratory distress. She has no wheezes.  GI: Soft. Bowel sounds are normal. She exhibits no distension. There is no tenderness.  Musculoskeletal: She exhibits no edema.  Does not voluntarily move extremities.   Lymphadenopathy:    She has no cervical adenopathy.    Neurological:  Is aware   Skin: Skin is warm and dry. She is not diaphoretic.     ASSESSMENT/ PLAN:  TODAY   1. Hypertension:  Stable 102/52 has CAD: will continue lopressor 25 mg daily; will monitor  2. Constipation;  stable will continue  miralax daily; and senna s 2 tabs nightly colace twice daily   3. Anemia: is stable  is on vitamin B 12:   her hgb is 10.5  4. Chronic pain due to  left hip fracture from August 2015: is stable is on long term pain medication; vicodin 5/325 mg twice daily and will monitor   PREVIOUS   5. Psychosis: no change in status.  unable answer questions; no recent reports of behavioral issues present; will continue ativan  0.5 mg in th PM   for anxiety and will monitor her status.    6. Dementia without behavioral disturbance: has declined in status; is presently not on medications; will not make changes will monitor  Her current weight is 80  pounds; her weight in Jan 2017 was 93 pounds.  Unfortunately weight loss is an expected out come in the late stages of dementia.   7. Hypercalcemia: no change in status: her calcium level is 10.9 . Her goals of care is comfort with her no hospitalization and dnr status.   8.  Hyperparathyroidism: is stable  Her PTH: is 84.290 She is not on medications; will not make changes will continue to monitor her status.   9. FTT:Her current weight is 80  pounds; her weight in Jan 2017 was 93 pounds.  Will continue supplements per facility protocol; remeron was not effective in her gaining weight.    MD is aware of resident's narcotic use and is in agreement with current plan of care. We will attempt to wean resident as apropriate     Synthia Innocenteborah Eudell Julian NP [facility]  Contact [phone] Monday through Friday 8am- 5pm  After hours call [phone]

## 2017-06-30 DIAGNOSIS — L89620 Pressure ulcer of left heel, unstageable: Secondary | ICD-10-CM | POA: Diagnosis not present

## 2017-06-30 DIAGNOSIS — L89153 Pressure ulcer of sacral region, stage 3: Secondary | ICD-10-CM | POA: Diagnosis not present

## 2017-07-06 DIAGNOSIS — L89153 Pressure ulcer of sacral region, stage 3: Secondary | ICD-10-CM | POA: Diagnosis not present

## 2017-07-07 ENCOUNTER — Other Ambulatory Visit: Payer: Self-pay

## 2017-07-07 MED ORDER — HYDROCODONE-ACETAMINOPHEN 5-325 MG PO TABS: ORAL_TABLET | ORAL | 0 refills | Status: AC

## 2017-07-07 NOTE — Telephone Encounter (Signed)
RX faxed to [facility] @ [phone], phone number [phone] 

## 2017-07-08 ENCOUNTER — Non-Acute Institutional Stay (SKILLED_NURSING_FACILITY): Payer: Medicare Other | Admitting: Adult Health

## 2017-07-08 ENCOUNTER — Encounter: Payer: Self-pay | Admitting: Adult Health

## 2017-07-08 DIAGNOSIS — F0150 Vascular dementia without behavioral disturbance: Secondary | ICD-10-CM

## 2017-07-08 DIAGNOSIS — R627 Adult failure to thrive: Secondary | ICD-10-CM

## 2017-07-08 DIAGNOSIS — R69 Illness, unspecified: Secondary | ICD-10-CM | POA: Diagnosis not present

## 2017-07-08 NOTE — Progress Notes (Signed)
Location:   [facility] Room Number: 219 B Place of Service:  SNF (31)   CODE STATUS: DNR  No Known Allergies  Chief Complaint  Patient presents with  . Acute Visit    Change in status    HPI:  She is at end of life. She is no longer responding to stimuli. She does have end stage dementia and failure to thrive. She is frail she is cyanotic. Hospice has been made aware of her condition. Her family has made aware also. The nursing staff is wanting to stop all medications except roxanol.    Past Medical History:  Diagnosis Date  . Anemia   . Anxiety 07/26/2012  . CAD (coronary artery disease) 07/26/2012   Cath 12/26/05: LAD 10-15% mid stenosis and distally, LCIRC small and patent, RCA 15-20% mid stenosis. EF 45%, LV with mild anterolateral and mid inferior wall hypokinesia  Myoview : 12/13/2009: No evidence of inducible reversible ischemia EF 77%  . Closed intertrochanteric fracture of right hip (HCC) 07/26/2012  . Coronary artery disease   . Dementia   . Dementia without behavioral disturbance 07/26/2012  . History of tobacco use 07/26/2012  . HTN (hypertension) 07/26/2012  . Hyperlipidemia 07/26/2012  . Hyperparathyroidism (HCC) 07/26/2012  . Hypertension   . Leukocytosis 07/26/2012  . Osteoporosis   . Pernicious anemia 07/26/2012  . Vitamin D deficiency 07/26/2012    Past Surgical History:  Procedure Laterality Date  . INTRAMEDULLARY (IM) NAIL INTERTROCHANTERIC Left 07/20/2014   Procedure: INTRAMEDULLARY (IM) NAIL INTERTROCHANTRIC;  Surgeon: Sheral Apleyimothy D Murphy, MD;  Location: [facility] OR;  Service: Orthopedics;  Laterality: Left;  . TONSILLECTOMY      Social History   Social History  . Marital status: Legally Separated    Spouse name: N/A  . Number of children: N/A  . Years of education: N/A   Occupational History  . Not on file.   Social History Main Topics  . Smoking status: Former Smoker    Packs/day: 0.50    Types: Cigarettes  . Smokeless tobacco: Never Used  . Alcohol  use No  . Drug use: No  . Sexual activity: Not on file   Other Topics Concern  . Not on file   Social History Narrative  . No narrative on file   Family History  Problem Relation Age of Onset  . Breast cancer Mother   . Lung cancer Father       VITAL SIGNS BP 126/78   Pulse 80   Temp (!) 97 F (36.1 C)   Resp 18   Ht 5\' 1"  (1.549 m)   Wt 80 lb 9.6 oz (36.6 kg)   SpO2 94%   BMI 15.23 kg/m   Patient's Medications  New Prescriptions   No medications on file  Previous Medications   CHOLECALCIFEROL (VITAMIN D) 1000 UNITS TABLET    Give 2 tablets (2000 unit) by mouth every morining   DOCUSATE SODIUM (COLACE) 100 MG CAPSULE    Take 1 capsule (100 mg total) by mouth 2 (two) times daily. Continue this while taking narcotics to help with bowel movements   HYDROCODONE-ACETAMINOPHEN (NORCO) 5-325 MG TABLET    Take one tablet by mouth twice daily for pain. Not to exceed 3000mg  of APAP from all sources/24h   LORAZEPAM (ATIVAN) 0.5 MG TABLET    Take 0.5 mg by mouth at bedtime.   MORPHINE 20 MG/5ML SOLUTION    Take 5 mg by mouth every 2 (two) hours as needed for pain.  Or respiratory distress   MULTIPLE VITAMINS-MINERALS (DECUBI-VITE) CAPS    Take 1 capsule by mouth daily.   NUTRITIONAL SUPPLEMENTS (NUTRITIONAL SUPPLEMENT PO)    Give House Supplement nutritional treat 4 oz three times daily by mouth with meals   NUTRITIONAL SUPPLEMENTS PO    House Shakes - Give 4 oz with daily with meals   SENNA-DOCUSATE (SENOKOT-S) 8.6-50 MG PER TABLET    Take 2 tablets by mouth at bedtime.   UNABLE TO FIND    HSG Regular Diet - HSG Puree texture, Regular consistency, Large portions  Modified Medications   No medications on file  Discontinued Medications   METOPROLOL TARTRATE (LOPRESSOR) 25 MG TABLET    Take 25 mg by mouth daily. Hold for BP <100/50; HR <50   NUTRITIONAL SUPPLEMENTS (NUTRITIONAL SUPPLEMENT PO)    Med Pass - Give 120 ml three times daily   POLYETHYLENE GLYCOL (MIRALAX / GLYCOLAX)  PACKET    Take 17 g by mouth daily.   VITAMIN B-12 (CYANOCOBALAMIN) 1000 MCG TABLET    Take 1,000 mcg by mouth daily. For anemia     SIGNIFICANT DIAGNOSTIC EXAMS  PREVIOUS   09-17-14: chest x-ray; rotated kyphotic projection hyperinflation with clear lungs   04-25-15: chest x-ray: no acute cardiopulmonary process  11-07-15: chest x-ray; no acute cardiopulmonary disease process  NO NEW EXAMS   LABS REVIEWED: PREVIOUS     07-26-16: wbc 6.2; hgb 11.1; hct 35.3; mcv 85.2; plt 334; glucose 94; bun 15.0; creat 0.65; k+ 4.2; na++ 140; liver normal albumin 4.3; hgb a1c 5.4; PTH 137.300; chol 190; ldl 84; trig 215; hdl 63  1-61-09: wbc 5.2; hgb 10.5; hct 32.3; mcv 87.8; plt 253; glucose 95; bun 13; creat 0.70; k+ 4.3; na++ 142 ca 10.9; liver normal albumin 3.2; chol 161; ldl 93; trig 111; hdl 46; PTH 84.290  04-18-17: wbc 4.8; hgb 11.5; hct 35.4 mcv 87.8; plt 264; glucose 92; bun 12.1; creat 0.53; k+ 4.3; na++ 141; ca 10.8; liver normal albumin 3.8; tsh 0.57   NO NEW LABS   Review of Systems  Unable to perform ROS: Dementia (is unresponsive )    Physical Exam  Constitutional: No distress.  Frail malnourished Teeth are rotten   Eyes: Conjunctivae are normal.  Neck: Neck supple. No JVD present. No thyromegaly present.  Cardiovascular: Normal rate, regular rhythm and intact distal pulses.   Respiratory: Effort normal and breath sounds normal. No respiratory distress. She has no wheezes.  GI: Soft. Bowel sounds are normal. She exhibits no distension. There is no tenderness.  Musculoskeletal: She exhibits no edema.  Is not moving extremities    Lymphadenopathy:    She has no cervical adenopathy.  Neurological:  Is not responsive   Skin: Skin is warm and dry. She is not diaphoretic.    ASSESSMENT/ PLAN:  TODAY   1. Failure to thrive 2. Dementia:   She is end of life; more than likely she has hours to days left   Will stop all medications except roxanol 5 mg every 2 hours as  needed.    MD is aware of resident's narcotic use and is in agreement with current plan of care. We will attempt to wean resident as apropriate     Synthia Innocent NP [facility]  Contact [phone] Monday through Friday 8am- 5pm  After hours call [phone]

## 2017-07-25 DEATH — deceased
